# Patient Record
Sex: Female | Born: 1955
Health system: Southern US, Community
[De-identification: ages and names within clinical notes are randomized; demographics above are authoritative.]

## PROBLEM LIST (undated history)

## (undated) DIAGNOSIS — M1990 Unspecified osteoarthritis, unspecified site: Secondary | ICD-10-CM

## (undated) DIAGNOSIS — Z933 Colostomy status: Secondary | ICD-10-CM

## (undated) DIAGNOSIS — E119 Type 2 diabetes mellitus without complications: Secondary | ICD-10-CM

## (undated) DIAGNOSIS — K649 Unspecified hemorrhoids: Secondary | ICD-10-CM

## (undated) DIAGNOSIS — J189 Pneumonia, unspecified organism: Secondary | ICD-10-CM

## (undated) DIAGNOSIS — Z5189 Encounter for other specified aftercare: Secondary | ICD-10-CM

## (undated) DIAGNOSIS — F419 Anxiety disorder, unspecified: Secondary | ICD-10-CM

## (undated) DIAGNOSIS — C801 Malignant (primary) neoplasm, unspecified: Secondary | ICD-10-CM

## (undated) DIAGNOSIS — I10 Essential (primary) hypertension: Secondary | ICD-10-CM

## (undated) DIAGNOSIS — C20 Malignant neoplasm of rectum: Secondary | ICD-10-CM

## (undated) DIAGNOSIS — D649 Anemia, unspecified: Secondary | ICD-10-CM

## (undated) DIAGNOSIS — G939 Disorder of brain, unspecified: Secondary | ICD-10-CM

## (undated) DIAGNOSIS — R569 Unspecified convulsions: Secondary | ICD-10-CM

## (undated) DIAGNOSIS — M24662 Ankylosis, left knee: Secondary | ICD-10-CM

## (undated) DIAGNOSIS — Z95828 Presence of other vascular implants and grafts: Secondary | ICD-10-CM

## (undated) DIAGNOSIS — S72402A Unspecified fracture of lower end of left femur, initial encounter for closed fracture: Secondary | ICD-10-CM

## (undated) DIAGNOSIS — N189 Chronic kidney disease, unspecified: Secondary | ICD-10-CM

## (undated) DIAGNOSIS — E7800 Pure hypercholesterolemia, unspecified: Secondary | ICD-10-CM

## (undated) DIAGNOSIS — K56609 Unspecified intestinal obstruction, unspecified as to partial versus complete obstruction: Secondary | ICD-10-CM

## (undated) HISTORY — PX: PORT A CATH INJECTION (ARMC HX): HXRAD1731

## (undated) HISTORY — PX: LACERATION REPAIR: SHX5168

## (undated) HISTORY — PX: ILEOSTOMY CLOSURE: SHX1784

## (undated) HISTORY — PX: COLON SURGERY: SHX602

## (undated) HISTORY — PX: ABDOMINAL HYSTERECTOMY: SHX81

## (undated) HISTORY — PX: PICC LINE PLACE PERIPHERAL (ARMC HX): HXRAD1248

---

## 2013-08-09 DIAGNOSIS — K56609 Unspecified intestinal obstruction, unspecified as to partial versus complete obstruction: Secondary | ICD-10-CM

## 2013-08-09 HISTORY — PX: ILEOSTOMY: SHX1783

## 2013-08-09 HISTORY — DX: Unspecified intestinal obstruction, unspecified as to partial versus complete obstruction: K56.609

## 2013-10-09 ENCOUNTER — Encounter (HOSPITAL_BASED_OUTPATIENT_CLINIC_OR_DEPARTMENT_OTHER): Payer: Self-pay | Admitting: Emergency Medicine

## 2013-10-09 ENCOUNTER — Emergency Department (HOSPITAL_BASED_OUTPATIENT_CLINIC_OR_DEPARTMENT_OTHER)
Admission: EM | Admit: 2013-10-09 | Discharge: 2013-10-09 | Disposition: A | Discharge status: Home / Self Care | Payer: BC Managed Care – PPO | Attending: Emergency Medicine | Admitting: Emergency Medicine

## 2013-10-09 DIAGNOSIS — Z87891 Personal history of nicotine dependence: Secondary | ICD-10-CM | POA: Insufficient documentation

## 2013-10-09 DIAGNOSIS — R000 Tachycardia, unspecified: Secondary | ICD-10-CM | POA: Insufficient documentation

## 2013-10-09 DIAGNOSIS — Z79899 Other long term (current) drug therapy: Secondary | ICD-10-CM | POA: Insufficient documentation

## 2013-10-09 DIAGNOSIS — R198 Other specified symptoms and signs involving the digestive system and abdomen: Secondary | ICD-10-CM | POA: Insufficient documentation

## 2013-10-09 DIAGNOSIS — I10 Essential (primary) hypertension: Secondary | ICD-10-CM | POA: Insufficient documentation

## 2013-10-09 DIAGNOSIS — E7800 Pure hypercholesterolemia, unspecified: Secondary | ICD-10-CM | POA: Insufficient documentation

## 2013-10-09 DIAGNOSIS — E119 Type 2 diabetes mellitus without complications: Secondary | ICD-10-CM | POA: Insufficient documentation

## 2013-10-09 DIAGNOSIS — K625 Hemorrhage of anus and rectum: Secondary | ICD-10-CM | POA: Insufficient documentation

## 2013-10-09 DIAGNOSIS — K6289 Other specified diseases of anus and rectum: Secondary | ICD-10-CM

## 2013-10-09 DIAGNOSIS — R739 Hyperglycemia, unspecified: Secondary | ICD-10-CM

## 2013-10-09 HISTORY — DX: Type 2 diabetes mellitus without complications: E11.9

## 2013-10-09 HISTORY — DX: Unspecified hemorrhoids: K64.9

## 2013-10-09 HISTORY — DX: Essential (primary) hypertension: I10

## 2013-10-09 HISTORY — DX: Pure hypercholesterolemia, unspecified: E78.00

## 2013-10-09 LAB — COMPREHENSIVE METABOLIC PANEL
ALBUMIN: 4.2 g/dL (ref 3.5–5.2)
ALT: 10 U/L (ref 0–35)
AST: 12 U/L (ref 0–37)
Alkaline Phosphatase: 72 U/L (ref 39–117)
BUN: 13 mg/dL (ref 6–23)
CALCIUM: 10.5 mg/dL (ref 8.4–10.5)
CO2: 26 mEq/L (ref 19–32)
Chloride: 96 mEq/L (ref 96–112)
Creatinine, Ser: 0.7 mg/dL (ref 0.50–1.10)
GFR calc Af Amer: >90 mL/min (ref >90)
GFR calc non Af Amer: >90 mL/min (ref >90)
GLUCOSE: 419 mg/dL — AB (ref 70–99)
Potassium: 3.7 mEq/L (ref 3.7–5.3)
SODIUM: 136 meq/L — AB (ref 137–147)
TOTAL PROTEIN: 8 g/dL (ref 6.0–8.3)
Total Bilirubin: 0.4 mg/dL (ref 0.3–1.2)

## 2013-10-09 LAB — CBC WITH DIFFERENTIAL/PLATELET
BASOS ABS: 0 10*3/uL (ref 0.0–0.1)
Basophils Relative: 0 % (ref 0–1)
EOS ABS: 0.2 10*3/uL (ref 0.0–0.7)
EOS PCT: 2 % (ref 0–5)
HCT: 39 % (ref 36.0–46.0)
Hemoglobin: 12.8 g/dL (ref 12.0–15.0)
Lymphocytes Relative: 33 % (ref 12–46)
Lymphs Abs: 3 10*3/uL (ref 0.7–4.0)
MCH: 25.8 pg — AB (ref 26.0–34.0)
MCHC: 32.8 g/dL (ref 30.0–36.0)
MCV: 78.5 fL (ref 78.0–100.0)
Monocytes Absolute: 0.6 10*3/uL (ref 0.1–1.0)
Monocytes Relative: 7 % (ref 3–12)
Neutro Abs: 5.2 10*3/uL (ref 1.7–7.7)
Neutrophils Relative %: 58 % (ref 43–77)
PLATELETS: 273 10*3/uL (ref 150–400)
RBC: 4.97 MIL/uL (ref 3.87–5.11)
RDW: 13.2 % (ref 11.5–15.5)
WBC: 9 10*3/uL (ref 4.0–10.5)

## 2013-10-09 LAB — CBG MONITORING, ED: Glucose-Capillary: 310 mg/dL — ABNORMAL HIGH (ref 70–99)

## 2013-10-09 MED ORDER — SODIUM CHLORIDE 0.9 % IV SOLN
Freq: Once | INTRAVENOUS | Status: AC
  Administered 2013-10-09: 19:00:00 via INTRAVENOUS

## 2013-10-09 MED ORDER — SODIUM CHLORIDE 0.9 % IV SOLN
Freq: Once | INTRAVENOUS | Status: AC
  Administered 2013-10-09: 20:00:00 via INTRAVENOUS

## 2013-10-09 MED ORDER — GELATIN ABSORBABLE 12-7 MM EX MISC
CUTANEOUS | Status: AC
  Administered 2013-10-09: 1
  Filled 2013-10-09: qty 1

## 2013-10-09 NOTE — Discharge Instructions (Signed)
Hyperglycemia °Hyperglycemia occurs when the glucose (sugar) in your blood is too high. Hyperglycemia can happen for many reasons, but it most often happens to people who do not know they have diabetes or are not managing their diabetes properly.  °CAUSES  °Whether you have diabetes or not, there are other causes of hyperglycemia. Hyperglycemia can occur when you have diabetes, but it can also occur in other situations that you might not be as aware of, such as: °Diabetes °· If you have diabetes and are having problems controlling your blood glucose, hyperglycemia could occur because of some of the following reasons: °· Not following your meal plan. °· Not taking your diabetes medications or not taking it properly. °· Exercising less or doing less activity than you normally do. °· Being sick. °Pre-diabetes °· This cannot be ignored. Before people develop Type 2 diabetes, they almost always have "pre-diabetes." This is when your blood glucose levels are higher than normal, but not yet high enough to be diagnosed as diabetes. Research has shown that some long-term damage to the body, especially the heart and circulatory system, may already be occurring during pre-diabetes. If you take action to manage your blood glucose when you have pre-diabetes, you may delay or prevent Type 2 diabetes from developing. °Stress °· If you have diabetes, you may be "diet" controlled or on oral medications or insulin to control your diabetes. However, you may find that your blood glucose is higher than usual in the hospital whether you have diabetes or not. This is often referred to as "stress hyperglycemia." Stress can elevate your blood glucose. This happens because of hormones put out by the body during times of stress. If stress has been the cause of your high blood glucose, it can be followed regularly by your caregiver. That way he/she can make sure your hyperglycemia does not continue to get worse or progress to  diabetes. °Steroids °· Steroids are medications that act on the infection fighting system (immune system) to block inflammation or infection. One side effect can be a rise in blood glucose. Most people can produce enough extra insulin to allow for this rise, but for those who cannot, steroids make blood glucose levels go even higher. It is not unusual for steroid treatments to "uncover" diabetes that is developing. It is not always possible to determine if the hyperglycemia will go away after the steroids are stopped. A special blood test called an A1c is sometimes done to determine if your blood glucose was elevated before the steroids were started. °SYMPTOMS °· Thirsty. °· Frequent urination. °· Dry mouth. °· Blurred vision. °· Tired or fatigue. °· Weakness. °· Sleepy. °· Tingling in feet or leg. °DIAGNOSIS  °Diagnosis is made by monitoring blood glucose in one or all of the following ways: °· A1c test. This is a chemical found in your blood. °· Fingerstick blood glucose monitoring. °· Laboratory results. °TREATMENT  °First, knowing the cause of the hyperglycemia is important before the hyperglycemia can be treated. Treatment may include, but is not be limited to: °· Education. °· Change or adjustment in medications. °· Change or adjustment in meal plan. °· Treatment for an illness, infection, etc. °· More frequent blood glucose monitoring. °· Change in exercise plan. °· Decreasing or stopping steroids. °· Lifestyle changes. °HOME CARE INSTRUCTIONS  °· Test your blood glucose as directed. °· Exercise regularly. Your caregiver will give you instructions about exercise. Pre-diabetes or diabetes which comes on with stress is helped by exercising. °· Eat wholesome,   balanced meals. Eat often and at regular, fixed times. Your caregiver or nutritionist will give you a meal plan to guide your sugar intake.  Being at an ideal weight is important. If needed, losing as little as 10 to 15 pounds may help improve blood  glucose levels. SEEK MEDICAL CARE IF:   You have questions about medicine, activity, or diet.  You continue to have symptoms (problems such as increased thirst, urination, or weight gain). SEEK IMMEDIATE MEDICAL CARE IF:   You are vomiting or have diarrhea.  Your breath smells fruity.  You are breathing faster or slower.  You are very sleepy or incoherent.  You have numbness, tingling, or pain in your feet or hands.  You have chest pain.  Your symptoms get worse even though you have been following your caregiver's orders.  If you have any other questions or concerns. Document Released: 01/19/2001 Document Revised: 10/18/2011 Document Reviewed: 11/22/2011 [facility] Patient Information 2014 [place], [place]. Rectal Bleeding Rectal bleeding is when blood passes out of the anus. It is usually a sign that something is wrong. It may not be serious, but it should always be evaluated. Rectal bleeding may present as bright red blood or extremely dark stools. The color may range from dark red or maroon to black (like tar). It is important that the cause of rectal bleeding be identified so treatment can be started and the problem corrected. CAUSES   Hemorrhoids. These are enlarged (dilated) blood vessels or veins in the anal or rectal area.  Fistulas. Theseare abnormal, burrowing channels that usually run from inside the rectum to the skin around the anus. They can bleed.  Anal fissures. This is a tear in the tissue of the anus. Bleeding occurs with bowel movements.  Diverticulosis. This is a condition in which pockets or sacs project from the bowel wall. Occasionally, the sacs can bleed.  Diverticulitis. Thisis an infection involving diverticulosis of the colon.  Proctitis and colitis. These are conditions in which the rectum, colon, or both, can become inflamed and pitted (ulcerated).  Polyps and cancer. Polyps are non-cancerous (benign) growths in the colon that may bleed. Certain  types of polyps turn into cancer.  Protrusion of the rectum. Part of the rectum can project from the anus and bleed.  Certain medicines.  Intestinal infections.  Blood vessel abnormalities. HOME CARE INSTRUCTIONS  Eat a high-fiber diet to keep your stool soft.  Limit activity.  Drink enough fluids to keep your urine clear or pale yellow.  Warm baths may be useful to soothe rectal pain.  Follow up with your caregiver as directed. SEEK IMMEDIATE MEDICAL CARE IF:  You develop increased bleeding.  You have black or dark red stools.  You vomit blood or material that looks like coffee grounds.  You have abdominal pain or tenderness.  You have a fever.  You feel weak, nauseous, or you faint.  You have severe rectal pain or you are unable to have a bowel movement. MAKE SURE YOU:  Understand these instructions.  Will watch your condition.  Will get help right away if you are not doing well or get worse. Document Released: 01/15/2002 Document Revised: 10/18/2011 Document Reviewed: 01/10/2011 [facility] Patient Information 2014 [place], [place].

## 2013-10-09 NOTE — ED Provider Notes (Signed)
Medical screening examination/treatment/procedure(s) were conducted as a shared visit with non-physician practitioner(s) and myself.  I personally evaluated the patient during the encounter.   EKG Interpretation   Date/Time:  Tuesday October 09 2013 18:28:53 EST Ventricular Rate:  160 PR Interval:  130 QRS Duration: 62 QT Interval:  244 QTC Calculation: 398 R Axis:   52 Text Interpretation:  Sinus tachycardia Septal infarct , age undetermined  Abnormal ECG Confirmed by Lesha Jager  MD, Iracema Lanagan ([id number]) on 10/09/2013 11:25:28  PM      Pt with 3cm mass protruding from rectum.  Presented with heavy bleeding from mass, no blood from rectum itself.  Bleeding stopped with gelfoam application.  hgb okay.  Will see surgeon tomorrow.  Malvin Johns, MD 10/09/13 [phone]

## 2013-10-09 NOTE — ED Provider Notes (Signed)
CSN: [id number]     Arrival date & time 10/09/13  1803 History   First MD Initiated Contact with Patient 10/09/13 1827     Chief Complaint  Patient presents with  . Hemorrhoids     (Consider location/radiation/quality/duration/timing/severity/associated sxs/prior Treatment) Patient is a 58 y.o. female presenting with hematochezia. The history is provided by the patient. No language interpreter was used.  Rectal Bleeding Quality:  Bright red Amount:  Moderate Duration:  1 day Timing:  Constant Progression:  Worsening Chronicity:  Chronic Context: hemorrhoids   Similar prior episodes: no   Relieved by:  Nothing Worsened by:  Nothing tried Ineffective treatments:  Hemorrhoid cream Associated symptoms: no abdominal pain   Risk factors: no hx of colorectal surgery     Past Medical History  Diagnosis Date  . Hypertension   . Diabetes mellitus without complication   . Elevated cholesterol   . Hemorrhoids    Past Surgical History  Procedure Laterality Date  . Abdominal hysterectomy     No family history on file. History  Substance Use Topics  . Smoking status: Former Research scientist (life sciences)  . Smokeless tobacco: Never Used  . Alcohol Use: No   OB History   Grav Para Term Preterm Abortions TAB SAB Ect Mult Living                 Review of Systems  Gastrointestinal: Positive for hematochezia and anal bleeding. Negative for abdominal pain.  All other systems reviewed and are negative.      Allergies  Review of patient's allergies indicates no known allergies.  Home Medications   Current Outpatient Rx  Name  Route  Sig  Dispense  Refill  . Fish Oil OIL   Does not apply   by Does not apply route.         Marland Kitchen GLIPIZIDE PO   Oral   Take by mouth.         . METFORMIN HCL PO   Oral   Take by mouth.         . Pravastatin Sodium (PRAVACHOL PO)   Oral   Take by mouth.         Marland Kitchen UNKNOWN TO PATIENT      Two Blood Pressure pills, does not know the names           BP 161/89  Temp(Src) 98.9 F (37.2 C) (Oral)  Resp 20  Ht 5\' 6"  (1.676 m)  Wt 150 lb (68.04 kg)  BMI 24.22 kg/m2  SpO2 100% Physical Exam  Nursing note and vitals reviewed. Constitutional: She is oriented to person, place, and time. She appears well-developed and well-nourished.  HENT:  Head: Normocephalic.  Right Ear: External ear normal.  Nose: Nose normal.  Eyes: EOM are normal. Pupils are equal, round, and reactive to light.  Neck: Normal range of motion.  Cardiovascular: Normal rate and regular rhythm.   tachycardia  Pulmonary/Chest: Effort normal.  Abdominal: She exhibits no distension.  Genitourinary:  Bright red blood,  Rectum erythematous, ulcerated, 3cm mass,   Does not look hemorrhoidal.   Musculoskeletal: Normal range of motion.  Neurological: She is alert and oriented to person, place, and time.  Psychiatric: She has a normal mood and affect.    ED Course  Procedures (including critical care time) Labs Review Labs Reviewed  CBC WITH DIFFERENTIAL - Abnormal; Notable for the following:    MCH 25.8 (*)    All other components within normal limits  COMPREHENSIVE METABOLIC PANEL  TYPE AND SCREEN   Imaging Review No results found.   EKG Interpretation None      MDM Dr. Tamera Punt in to see,   Gelfoam to area  Bleeding decreased    Final diagnoses:  Rectal mass  Hyperglycemia   Consult to General Surgery.   Dr. Hassell Done advised to have pt call office at 9:05 am to be seen in urgent surgery clinic tomorrow.  He advised they will biopsy and set up on going care.   Pt counseled on rectal mass and need for evaluation.  She understands mass could be a cancer and need definitive diagnosis.   Pt's glucose decreased with fluids.   Pt has not taken evening medication.   Pt advised to take her medication and see her Md for recheck of diabetes.        [place], PA-C 10/09/13 2154

## 2013-10-09 NOTE — ED Notes (Signed)
Patient states she has a chronic hemorrhoid problem.  States today at approximately 1630 she developed from the hemorrhoid today.  States she had used Preparation H several times over the last four days due to itching.

## 2013-10-10 ENCOUNTER — Ambulatory Visit (INDEPENDENT_AMBULATORY_CARE_PROVIDER_SITE_OTHER): Payer: BC Managed Care – PPO | Admitting: General Surgery

## 2013-10-10 ENCOUNTER — Encounter (INDEPENDENT_AMBULATORY_CARE_PROVIDER_SITE_OTHER): Payer: Self-pay | Admitting: General Surgery

## 2013-10-10 ENCOUNTER — Encounter (INDEPENDENT_AMBULATORY_CARE_PROVIDER_SITE_OTHER): Payer: Self-pay

## 2013-10-10 ENCOUNTER — Other Ambulatory Visit (INDEPENDENT_AMBULATORY_CARE_PROVIDER_SITE_OTHER): Payer: Self-pay | Admitting: General Surgery

## 2013-10-10 DIAGNOSIS — C210 Malignant neoplasm of anus, unspecified: Secondary | ICD-10-CM

## 2013-10-10 DIAGNOSIS — R198 Other specified symptoms and signs involving the digestive system and abdomen: Secondary | ICD-10-CM

## 2013-10-10 DIAGNOSIS — K6289 Other specified diseases of anus and rectum: Secondary | ICD-10-CM

## 2013-10-10 MED ORDER — LIDOCAINE 5 % EX OINT: 1.0000 "application " | TOPICAL_OINTMENT | CUTANEOUS | Status: DC | PRN

## 2013-10-10 NOTE — Progress Notes (Signed)
Chief Complaint  Patient presents with  . Rectal Bleeding    HISTORY: Barbara Matthews is a 58 y.o. female who presents to the office with rectal bleeding.    She has had occasional bleeding since July.  She had an severe episode of bleeding last night.  She presented to [facility] with severe rectal bleeding. It appears this was stopped with Gelfoam placement.  She has tried tucks and prep H at home with good success.  Bowel movement makes the symptoms worse.   It is intermittent in nature.  her bowel habits are irregular and her bowel movements are every 3-4 days.  Her fiber intake is min.  SHe has never had a colonoscopy.     Past Medical History  Diagnosis Date  . Hypertension   . Diabetes mellitus without complication   . Elevated cholesterol   . Hemorrhoids       Past Surgical History  Procedure Laterality Date  . Abdominal hysterectomy          Current Outpatient Prescriptions  Medication Sig Dispense Refill  . amLODipine (NORVASC) 10 MG tablet Take 10 mg by mouth daily.      . Fish Oil OIL by Does not apply route.      Marland Kitchen GLIPIZIDE PO Take by mouth.      . losartan (COZAAR) 100 MG tablet Take 100 mg by mouth daily.      Marland Kitchen METFORMIN HCL PO Take by mouth.      . Pravastatin Sodium (PRAVACHOL PO) Take by mouth.      Marland Kitchen UNKNOWN TO PATIENT Two Blood Pressure pills, does not know the names      . Vitamin D, Ergocalciferol, (DRISDOL) 50000 UNITS CAPS capsule       . lidocaine (XYLOCAINE) 5 % ointment Apply 1 application topically as needed.  35.44 g  0   No current facility-administered medications for this visit.      No Known Allergies    History reviewed. No pertinent family history.  History   Social History  . Marital Status: Single    Spouse Name: N/A    Number of Children: N/A  . Years of Education: N/A   Social History Main Topics  . Smoking status: Former Research scientist (life sciences)  . Smokeless tobacco: Never Used  . Alcohol Use: No  . Drug Use: No  . Sexual Activity:  None   Other Topics Concern  . None   Social History Narrative  . None      REVIEW OF SYSTEMS - PERTINENT POSITIVES ONLY: Review of Systems - General ROS: negative for - chills, fever or weight loss Hematological and Lymphatic ROS: negative for - bleeding problems, blood clots or bruising Respiratory ROS: no cough, shortness of breath, or wheezing Cardiovascular ROS: no chest pain or dyspnea on exertion Gastrointestinal ROS: no abdominal pain, change in bowel habits Genito-Urinary ROS: no dysuria, trouble voiding, or hematuria  EXAM: There were no vitals filed for this visit.  General appearance: alert and cooperative Resp: clear to auscultation bilaterally Cardio: regular rate and rhythm GI: normal findings: soft, non-tender Anal exam: Large firm perianal mass in the posterior region extending 2-3 cm proximally.  Concerning for anal carcinoma  Procedure: Biopsy of anal mass Surgeon: Marcello Moores Diagnosis: anal mass  Assistant: Morris After the risks and benefits were explained, verbal consent was obtained for above procedure  Anesthesia: local  Description: Her perianal area was cleaned with ChloraPrep. Subcutaneous lidocaine with epinephrine was injected For local anesthesia.  A biopsy was taken with scissors. Hemostasis was achieved with direct pressure and silver nitrate. The patient tolerated the procedure well.    ASSESSMENT AND PLAN: Barbara Matthews Is a 58 year old female who has recently noted an increase in rectal bleeding. On exam she has an anal mass that is concerning for anal carcinoma. A biopsy was taken and sent to pathology. She has never had a colonoscopy and she is over the age of 58. I have recommended that she undergo a screening colonoscopy, no matter the results of the biopsy. We will get this scheduled ASAP.  I will call her with the results of her biopsy as soon as I get them. We did discuss that this is most likely an anal cancer. We briefly discussed the  treatment for anal cancer and prognosis.    Rosario Adie, MD Colon and Rectal Surgery / [facility]      Visit Diagnoses: 1. Mass of anus     Primary Care Physician: No primary provider on file.

## 2013-10-10 NOTE — Patient Instructions (Addendum)
We will call you with the results of your biopsy in a couple days.  Call the office if you have bleeding that won't stop with direct pressure (rolled up washcloth placed between butt cheeks and sit on a hard surface).    [facility] [place] [facility]  ONE-DAY (1) PRE-OP HOME COLON PREP INSTRUCTIONS: ** MIRALAX / GATORADE PREP **  You must follow the instructions below carefully.  If you have questions or problems, please call and speak to someone in the clinic department at our office:   [phone].     INSTRUCTIONS: 1. Five days prior to your procedure do not eat nuts, popcorn, or fruit with seeds.  Stop all fiber supplements such as Metamucil, Citrucel, etc. 2. Two days before surgery fill the prescription at a pharmacy of your choice and purchase the additional supplies below.         MIRALAX - GATORADE -- DULCOLAX TABS:   Purchase a bottle of MIRALAX  (238 gm bottle)    In addition, purchase four (4) DULCOLAX TABLETS (no prescription required- ask the pharmacist if you can't find them)    Purchase one 64 oz GATORADE.  (Do NOT purchase red Gatorade; any other flavor is acceptable) and place in refrigerator to get cold.  3.   Day Before Surgery:   6 am: take the 4 Dulcolax tablets   You may only have clear liquids (tea, coffee, juice, broth, jello, soft drinks, gummy bears).  You cannot have solid foods, cream, milk or milk products.  Drink at lease 8 ounces of liquids every hour while awake.   Mix the entire bottle of MiraLax and the Gatorade in a large container.    10:00am: Begin drinking the Gatorade mixture until gone (8 oz every 15-30 minutes).      You may suck on a lime wedge or hard candy to "freshen your palate" in between glasses   If you are a diabetic, take your blood sugar reading several time throughout the prep.  Have some juice available to take if your sugar level gets too low   You may feel chilled while taking the prep.  Have some warm tea or broth to help warm up.   Continue  clear liquids until midnight or bedtime  3. The day of your procedure:   Do not eat or drink ANYTHING after midnight before your surgery.     If you take Heart or Blood Pressure medicine, ask the pre-op nurses about these during your preop appointment.   Further pre-operative instructions will be given to you from the hospital.   Expect to be contacted 5-7 days before your surgery.

## 2013-10-15 ENCOUNTER — Other Ambulatory Visit (INDEPENDENT_AMBULATORY_CARE_PROVIDER_SITE_OTHER): Payer: Self-pay

## 2013-10-15 ENCOUNTER — Telehealth (INDEPENDENT_AMBULATORY_CARE_PROVIDER_SITE_OTHER): Payer: Self-pay | Admitting: General Surgery

## 2013-10-15 DIAGNOSIS — K6289 Other specified diseases of anus and rectum: Secondary | ICD-10-CM

## 2013-10-15 NOTE — Telephone Encounter (Signed)
Left message to call to discuss biopsy results

## 2013-10-17 ENCOUNTER — Telehealth (INDEPENDENT_AMBULATORY_CARE_PROVIDER_SITE_OTHER): Payer: Self-pay | Admitting: *Deleted

## 2013-10-17 NOTE — Telephone Encounter (Signed)
I spoke with pt and informed her of the appt for her to see Dr. Moses Manners at [facility] in [facility] on 10/18/13 with an arrival time of 10:15am.  She states she will be unable to go tomorrow so I provided her with their phone number [phone]) to call and reschedule appt.  Patient also states she has some additional questions regarding her colonoscopy scheduled for Friday 10/19/13 and would like to talk with Dr. Marcello Moores.  She is also questioning a prescription that she is supposed to get filled today but I do not see anything under her medication list to advise her upon.  I informed her that I will route this message to Dr. Marcello Moores to give her a call.  She states that she will be available at [phone] after 1:30pm today.  She is agreeable with all instructions given.

## 2013-10-19 ENCOUNTER — Ambulatory Visit (HOSPITAL_COMMUNITY)
Admission: RE | Admit: 2013-10-19 | Discharge: 2013-10-19 | Disposition: A | Discharge status: Home / Self Care | Payer: BC Managed Care – PPO | Source: Ambulatory Visit | Attending: General Surgery | Admitting: General Surgery

## 2013-10-19 ENCOUNTER — Encounter (HOSPITAL_COMMUNITY)
Admission: RE | Disposition: A | Discharge status: Home / Self Care | Payer: Self-pay | Source: Ambulatory Visit | Attending: General Surgery

## 2013-10-19 ENCOUNTER — Encounter (HOSPITAL_COMMUNITY): Payer: Self-pay | Admitting: *Deleted

## 2013-10-19 DIAGNOSIS — Z87891 Personal history of nicotine dependence: Secondary | ICD-10-CM | POA: Insufficient documentation

## 2013-10-19 DIAGNOSIS — D126 Benign neoplasm of colon, unspecified: Secondary | ICD-10-CM

## 2013-10-19 DIAGNOSIS — I10 Essential (primary) hypertension: Secondary | ICD-10-CM | POA: Insufficient documentation

## 2013-10-19 DIAGNOSIS — E119 Type 2 diabetes mellitus without complications: Secondary | ICD-10-CM | POA: Insufficient documentation

## 2013-10-19 DIAGNOSIS — Z90710 Acquired absence of both cervix and uterus: Secondary | ICD-10-CM | POA: Insufficient documentation

## 2013-10-19 DIAGNOSIS — E7800 Pure hypercholesterolemia, unspecified: Secondary | ICD-10-CM | POA: Insufficient documentation

## 2013-10-19 DIAGNOSIS — K625 Hemorrhage of anus and rectum: Secondary | ICD-10-CM | POA: Insufficient documentation

## 2013-10-19 DIAGNOSIS — Z79899 Other long term (current) drug therapy: Secondary | ICD-10-CM | POA: Insufficient documentation

## 2013-10-19 DIAGNOSIS — Z1211 Encounter for screening for malignant neoplasm of colon: Secondary | ICD-10-CM

## 2013-10-19 HISTORY — DX: Anxiety disorder, unspecified: F41.9

## 2013-10-19 HISTORY — PX: COLONOSCOPY: SHX5424

## 2013-10-19 LAB — CBC
HEMATOCRIT: 39 % (ref 36.0–46.0)
Hemoglobin: 12.6 g/dL (ref 12.0–15.0)
MCH: 25.6 pg — ABNORMAL LOW (ref 26.0–34.0)
MCHC: 32.3 g/dL (ref 30.0–36.0)
MCV: 79.1 fL (ref 78.0–100.0)
Platelets: 310 10*3/uL (ref 150–400)
RBC: 4.93 MIL/uL (ref 3.87–5.11)
RDW: 13.6 % (ref 11.5–15.5)
WBC: 7.2 10*3/uL (ref 4.0–10.5)

## 2013-10-19 LAB — GLUCOSE, CAPILLARY: Glucose-Capillary: 227 mg/dL — ABNORMAL HIGH (ref 70–99)

## 2013-10-19 SURGERY — COLONOSCOPY
Anesthesia: Moderate Sedation

## 2013-10-19 MED ORDER — MIDAZOLAM HCL 10 MG/2ML IJ SOLN
INTRAMUSCULAR | Status: AC
  Filled 2013-10-19: qty 2

## 2013-10-19 MED ORDER — LIDOCAINE 5 % EX OINT
TOPICAL_OINTMENT | CUTANEOUS | Status: DC
  Filled 2013-10-19: qty 35.44

## 2013-10-19 MED ORDER — FENTANYL CITRATE 0.05 MG/ML IJ SOLN
INTRAMUSCULAR | Status: AC
  Filled 2013-10-19: qty 2

## 2013-10-19 MED ORDER — MIDAZOLAM HCL 10 MG/2ML IJ SOLN
INTRAMUSCULAR | Status: DC | PRN
  Administered 2013-10-19: 2 mg via INTRAVENOUS
  Administered 2013-10-19: 1 mg via INTRAVENOUS
  Administered 2013-10-19 (×2): 2 mg via INTRAVENOUS

## 2013-10-19 MED ORDER — DIPHENHYDRAMINE HCL 50 MG/ML IJ SOLN
INTRAMUSCULAR | Status: AC
  Filled 2013-10-19: qty 1

## 2013-10-19 MED ORDER — FENTANYL CITRATE 0.05 MG/ML IJ SOLN
INTRAMUSCULAR | Status: DC | PRN
  Administered 2013-10-19 (×2): 25 ug via INTRAVENOUS

## 2013-10-19 MED ORDER — METOPROLOL TARTRATE 1 MG/ML IV SOLN
INTRAVENOUS | Status: AC
  Filled 2013-10-19: qty 5

## 2013-10-19 MED ORDER — METOPROLOL TARTRATE 1 MG/ML IV SOLN
5.0000 mg | Freq: Once | INTRAVENOUS | Status: AC
  Administered 2013-10-19: 5 mg via INTRAVENOUS

## 2013-10-19 MED ORDER — SODIUM CHLORIDE 0.9 % IV SOLN
INTRAVENOUS | Status: DC
  Administered 2013-10-19: 500 mL via INTRAVENOUS

## 2013-10-19 MED ORDER — LIDOCAINE VISCOUS 2 % MT SOLN
OROMUCOSAL | Status: AC
  Filled 2013-10-19: qty 15

## 2013-10-19 MED ORDER — DIPHENHYDRAMINE HCL 50 MG/ML IJ SOLN
INTRAMUSCULAR | Status: DC | PRN
  Administered 2013-10-19: 25 mg via INTRAVENOUS

## 2013-10-19 NOTE — H&P (View-Only) (Signed)
Chief Complaint  Patient presents with  . Rectal Bleeding    HISTORY: Barbara Matthews is a 58 y.o. female who presents to the office with rectal bleeding.    She has had occasional bleeding since July.  She had an severe episode of bleeding last night.  She presented to [facility] with severe rectal bleeding. It appears this was stopped with Gelfoam placement.  She has tried tucks and prep H at home with good success.  Bowel movement makes the symptoms worse.   It is intermittent in nature.  her bowel habits are irregular and her bowel movements are every 3-4 days.  Her fiber intake is min.  SHe has never had a colonoscopy.     Past Medical History  Diagnosis Date  . Hypertension   . Diabetes mellitus without complication   . Elevated cholesterol   . Hemorrhoids       Past Surgical History  Procedure Laterality Date  . Abdominal hysterectomy          Current Outpatient Prescriptions  Medication Sig Dispense Refill  . amLODipine (NORVASC) 10 MG tablet Take 10 mg by mouth daily.      . Fish Oil OIL by Does not apply route.      Marland Kitchen GLIPIZIDE PO Take by mouth.      . losartan (COZAAR) 100 MG tablet Take 100 mg by mouth daily.      Marland Kitchen METFORMIN HCL PO Take by mouth.      . Pravastatin Sodium (PRAVACHOL PO) Take by mouth.      Marland Kitchen UNKNOWN TO PATIENT Two Blood Pressure pills, does not know the names      . Vitamin D, Ergocalciferol, (DRISDOL) 50000 UNITS CAPS capsule       . lidocaine (XYLOCAINE) 5 % ointment Apply 1 application topically as needed.  35.44 g  0   No current facility-administered medications for this visit.      No Known Allergies    History reviewed. No pertinent family history.  History   Social History  . Marital Status: Single    Spouse Name: N/A    Number of Children: N/A  . Years of Education: N/A   Social History Main Topics  . Smoking status: Former Research scientist (life sciences)  . Smokeless tobacco: Never Used  . Alcohol Use: No  . Drug Use: No  . Sexual Activity:  None   Other Topics Concern  . None   Social History Narrative  . None      REVIEW OF SYSTEMS - PERTINENT POSITIVES ONLY: Review of Systems - General ROS: negative for - chills, fever or weight loss Hematological and Lymphatic ROS: negative for - bleeding problems, blood clots or bruising Respiratory ROS: no cough, shortness of breath, or wheezing Cardiovascular ROS: no chest pain or dyspnea on exertion Gastrointestinal ROS: no abdominal pain, change in bowel habits Genito-Urinary ROS: no dysuria, trouble voiding, or hematuria  EXAM: There were no vitals filed for this visit.  General appearance: alert and cooperative Resp: clear to auscultation bilaterally Cardio: regular rate and rhythm GI: normal findings: soft, non-tender Anal exam: Large firm perianal mass in the posterior region extending 2-3 cm proximally.  Concerning for anal carcinoma  Procedure: Biopsy of anal mass Surgeon: Marcello Moores Diagnosis: anal mass  Assistant: Morris After the risks and benefits were explained, verbal consent was obtained for above procedure  Anesthesia: local  Description: Her perianal area was cleaned with ChloraPrep. Subcutaneous lidocaine with epinephrine was injected For local anesthesia.  A biopsy was taken with scissors. Hemostasis was achieved with direct pressure and silver nitrate. The patient tolerated the procedure well.    ASSESSMENT AND PLAN: Barbara Matthews Is a 58 year old female who has recently noted an increase in rectal bleeding. On exam she has an anal mass that is concerning for anal carcinoma. A biopsy was taken and sent to pathology. She has never had a colonoscopy and she is over the age of 45. I have recommended that she undergo a screening colonoscopy, no matter the results of the biopsy. We will get this scheduled ASAP.  I will call her with the results of her biopsy as soon as I get them. We did discuss that this is most likely an anal cancer. We briefly discussed the  treatment for anal cancer and prognosis.    Rosario Adie, MD Colon and Rectal Surgery / [facility]      Visit Diagnoses: 1. Mass of anus     Primary Care Physician: No primary provider on file.

## 2013-10-19 NOTE — Interval H&P Note (Signed)
History and Physical Interval Note:  10/19/2013 2:03 PM  [place]  has presented today for surgery, with the diagnosis of screening  The various methods of treatment have been discussed with the patient and family. After consideration of risks, benefits and other options for treatment, the patient has consented to  Procedure(s): SCREENING COLONOSCOPY (N/A) as a surgical intervention .  The patient's history has been reviewed, patient examined, no change in status, stable for surgery.  I have reviewed the patient's chart and labs.  Questions were answered to the patient's satisfaction.     Rosario Adie, MD  [facility]

## 2013-10-19 NOTE — Op Note (Signed)
[facility] [place], [address]   COLONOSCOPY PROCEDURE REPORT  PATIENT: Barbara Matthews, Barbara Matthews  MR#: [id number] BIRTHDATE: [birth year] , 57  yrs. old GENDER: Female ENDOSCOPIST: Rosario Adie, MD REFERRED BY: PROCEDURE DATE:  10/19/2013 PROCEDURE:   Colorectal cancer screening - average risk patient ASA CLASS:   Class II INDICATIONS:Colorectal cancer screening. MEDICATIONS: Fentanyl 50 mcg IV, Versed 7 mg IV, and Diphenhydramine (Benadryl) 25  DESCRIPTION OF PROCEDURE:   After the risks benefits and alternatives of the procedure were thoroughly explained, informed consent was obtained.  A digital rectal exam revealed a palpable rectal mass.   The   endoscope was introduced through the anus and advanced to the cecum, which was identified by both the appendix and ileocecal valve. No adverse events experienced.   The quality of the prep was good, using MiraLax  The instrument was then slowly withdrawn as the colon was fully examined.      COLON FINDINGS: A small polypoid shaped flat polyp was found in the sigmoid colon.  A polypectomy was performed with cold forceps. Retroflexion showed the known anal mass extending into distal rectum.     The scope was withdrawn and the procedure completed.   TIME TO CECUM: 30 minutes 0 seconds WITHDRAWAL TIME:24 minutes 0 seconds COMPLICATIONS: There were no complications. ENDOSCOPIC IMPRESSION: 1.   Small flat polyp was found in the sigmoid colon; polypectomy was performed with cold forceps 2.   Anorectal mass  RECOMMENDATIONS: Repeat colonoscopy in 5 years if polyp adenomatous; otherwise 10 years  eSigned:  Rosario Adie, MD 26/33/3545 3:22 PM   cc:

## 2013-10-19 NOTE — Discharge Instructions (Signed)
Post Colonoscopy Instructions ° °1. DIET: Follow a light bland diet the first 24 hours after arrival home, such as soup, liquids, crackers, etc.  Be sure to include lots of fluids daily.  Avoid fast food or heavy meals as your are more likely to get nauseated.   °2. You may have some mild rectal bleeding for the first few days after the procedure.  This should get less and less with time.  Resume any blood thinners 2 days after your procedure unless directed otherwise by your physician. °3. Take your usually prescribed home medications unless otherwise directed. °a. If you have any pain, it is helpful to get up and walk around, as it is usually from excess gas. °b. If this is not helpful, you can take an over-the-counter pain medication.  Choose one of the following that works best for you: °i. Naproxen (Aleve, etc)  Two 220mg tabs twice a day °ii. Ibuprofen (Advil, etc) Three 200mg tabs four times a day (every meal & bedtime) °iii. If you still have pain after using one of these, please call the office °4. It is normal to not have a bowel movement for 2-3 days after colonoscopy.   ° °5. ACTIVITIES as tolerated:   °6. You may resume regular (light) daily activities beginning the next day--such as daily self-care, walking, climbing stairs--gradually increasing activities as tolerated.  ° ° °WHEN TO CALL US [phone]: °1. Fever over 101.5 F (38.5 C)  °2. Severe abdominal or chest pain  °3. Large amount of rectal bleeding, passing multiple blood clots  °4. Dizziness or shortness of breath °5. Increasing nausea or vomiting ° ° The clinic staff is available to answer your questions during regular business hours (8:30am-5pm).  Please don’t hesitate to call and ask to speak to one of our nurses for clinical concerns.  ° If you have a medical emergency, go to the nearest emergency room or call 911. ° A surgeon from [facility] is always on call at the hospitals ° ° °[facility] °[address], [address], [place], [place]  [address] ? °MAIN: [phone] ? TOLL FREE: 1-800-359-8415 ?  °FAX [phone] °[email or web address] ° ° °

## 2013-10-22 ENCOUNTER — Encounter (HOSPITAL_COMMUNITY): Payer: Self-pay | Admitting: General Surgery

## 2013-11-12 ENCOUNTER — Telehealth (INDEPENDENT_AMBULATORY_CARE_PROVIDER_SITE_OTHER): Payer: Self-pay | Admitting: General Surgery

## 2013-11-12 NOTE — Telephone Encounter (Signed)
Patient called and stated that someone from Dr Marcello Moores office called on Friday and I told the patient that no one was in the office on Friday and she stated that our number came though on her phone that someone from here called her and I looked, and I did not see a phone message, I told the patient that I will send a message to Dr Marcello Moores or Holley Raring

## 2013-11-13 ENCOUNTER — Telehealth (INDEPENDENT_AMBULATORY_CARE_PROVIDER_SITE_OTHER): Payer: Self-pay

## 2013-11-13 ENCOUNTER — Other Ambulatory Visit (INDEPENDENT_AMBULATORY_CARE_PROVIDER_SITE_OTHER): Payer: Self-pay

## 2013-11-13 DIAGNOSIS — K6289 Other specified diseases of anus and rectum: Secondary | ICD-10-CM

## 2013-11-13 NOTE — Telephone Encounter (Signed)
Scheduled patient for Ct Abdomen/pelvis, And chest with contrast  At [facility] 11-20-13@ 8am , Inquire about GYN appt Per Dr Marcello Moores , Spoke with patient to inform her of CT Appts she stated she was unaware she needed to have a CT, she ask to speak TO Dr. Marcello Moores concerning her Path Results and she also did not reschedule an appointment as advised with [facility] physicians  OB/GYN  # [phone]. Dr. Marcello Moores spoke with patient about her finding and why other images/GYN visits are needed and agreed to go forward. I spoke with Patient she inform me  she cancelled the CT for 11-20-13 when Radiology called today ,she is claustrophobic so she needs an open CT . She is asking for the CT to be done in [facility]. She was advised to call [facility] Physician .

## 2013-11-19 ENCOUNTER — Telehealth (INDEPENDENT_AMBULATORY_CARE_PROVIDER_SITE_OTHER): Payer: Self-pay | Admitting: *Deleted

## 2013-11-19 NOTE — Telephone Encounter (Signed)
Patient given Date/time of CT at Med center in [facility] , she is aware to be NPO after MD tonight and to P/U contrast today for her CT. She states she did schedule the GYN visit for 12/03/13.

## 2013-11-19 NOTE — Telephone Encounter (Signed)
LM for pt to cal the office.  Make sure pt knows that she needs to pick up her contrast for her CT's that are scheduled for tomorrow, 4.14.15 @ [place]!

## 2013-11-20 ENCOUNTER — Encounter (HOSPITAL_BASED_OUTPATIENT_CLINIC_OR_DEPARTMENT_OTHER): Payer: Self-pay

## 2013-11-20 ENCOUNTER — Ambulatory Visit (HOSPITAL_BASED_OUTPATIENT_CLINIC_OR_DEPARTMENT_OTHER)
Admission: RE | Admit: 2013-11-20 | Discharge: 2013-11-20 | Disposition: A | Discharge status: Home / Self Care | Payer: BC Managed Care – PPO | Source: Ambulatory Visit | Attending: General Surgery | Admitting: General Surgery

## 2013-11-20 ENCOUNTER — Ambulatory Visit (HOSPITAL_COMMUNITY): Payer: BC Managed Care – PPO

## 2013-11-20 DIAGNOSIS — K5900 Constipation, unspecified: Secondary | ICD-10-CM | POA: Insufficient documentation

## 2013-11-20 DIAGNOSIS — R198 Other specified symptoms and signs involving the digestive system and abdomen: Secondary | ICD-10-CM | POA: Insufficient documentation

## 2013-11-20 DIAGNOSIS — M479 Spondylosis, unspecified: Secondary | ICD-10-CM | POA: Insufficient documentation

## 2013-11-20 DIAGNOSIS — R911 Solitary pulmonary nodule: Secondary | ICD-10-CM | POA: Insufficient documentation

## 2013-11-20 DIAGNOSIS — K649 Unspecified hemorrhoids: Secondary | ICD-10-CM | POA: Insufficient documentation

## 2013-11-20 DIAGNOSIS — K7689 Other specified diseases of liver: Secondary | ICD-10-CM | POA: Insufficient documentation

## 2013-11-20 HISTORY — DX: Malignant (primary) neoplasm, unspecified: C80.1

## 2013-11-20 MED ORDER — IOHEXOL 300 MG/ML  SOLN
100.0000 mL | Freq: Once | INTRAMUSCULAR | Status: AC | PRN
  Administered 2013-11-20: 100 mL via INTRAVENOUS

## 2013-12-04 ENCOUNTER — Telehealth (INDEPENDENT_AMBULATORY_CARE_PROVIDER_SITE_OTHER): Payer: Self-pay | Admitting: General Surgery

## 2013-12-04 ENCOUNTER — Other Ambulatory Visit (INDEPENDENT_AMBULATORY_CARE_PROVIDER_SITE_OTHER): Payer: Self-pay

## 2013-12-04 DIAGNOSIS — C20 Malignant neoplasm of rectum: Secondary | ICD-10-CM

## 2013-12-04 NOTE — Telephone Encounter (Signed)
Spoke with Dr Bethann Goo a gynecologist in [place] about pt's exam.  He states patient's cancer unlikely to be GYN in origin. CT scan does not show are mentioned ovaries. He has ordered a pelvic ultrasound to complete GYN workup.

## 2013-12-11 ENCOUNTER — Telehealth: Payer: Self-pay | Admitting: Hematology & Oncology

## 2013-12-11 NOTE — Telephone Encounter (Signed)
I spoke w NEW PATIENT today to remind them of their appointment with Dr. Ennever. Also, advised them to bring all meds and insurance information. ° °

## 2013-12-12 ENCOUNTER — Encounter: Payer: Self-pay | Admitting: Hematology & Oncology

## 2013-12-12 ENCOUNTER — Ambulatory Visit: Payer: BC Managed Care – PPO

## 2013-12-12 ENCOUNTER — Other Ambulatory Visit (HOSPITAL_BASED_OUTPATIENT_CLINIC_OR_DEPARTMENT_OTHER): Payer: BC Managed Care – PPO | Admitting: Lab

## 2013-12-12 ENCOUNTER — Ambulatory Visit (HOSPITAL_BASED_OUTPATIENT_CLINIC_OR_DEPARTMENT_OTHER): Payer: BC Managed Care – PPO | Admitting: Hematology & Oncology

## 2013-12-12 VITALS — BP 154/75 | HR 120 | Temp 99.2°F | Resp 12 | Ht 66.0 in | Wt 144.0 lb

## 2013-12-12 DIAGNOSIS — C210 Malignant neoplasm of anus, unspecified: Secondary | ICD-10-CM

## 2013-12-12 DIAGNOSIS — K6289 Other specified diseases of anus and rectum: Secondary | ICD-10-CM

## 2013-12-12 LAB — CBC WITH DIFFERENTIAL (CANCER CENTER ONLY)
BASO#: 0.1 10*3/uL (ref 0.0–0.2)
BASO%: 0.5 % (ref 0.0–2.0)
EOS%: 1.4 % (ref 0.0–7.0)
Eosinophils Absolute: 0.1 10*3/uL (ref 0.0–0.5)
HCT: 35.2 % (ref 34.8–46.6)
HGB: 11.1 g/dL — ABNORMAL LOW (ref 11.6–15.9)
LYMPH#: 2.2 10*3/uL (ref 0.9–3.3)
LYMPH%: 23.5 % (ref 14.0–48.0)
MCH: 25.5 pg — ABNORMAL LOW (ref 26.0–34.0)
MCHC: 31.5 g/dL — ABNORMAL LOW (ref 32.0–36.0)
MCV: 81 fL (ref 81–101)
MONO#: 0.7 10*3/uL (ref 0.1–0.9)
MONO%: 7.3 % (ref 0.0–13.0)
NEUT%: 67.3 % (ref 39.6–80.0)
NEUTROS ABS: 6.3 10*3/uL (ref 1.5–6.5)
PLATELETS: 367 10*3/uL (ref 145–400)
RBC: 4.36 10*6/uL (ref 3.70–5.32)
RDW: 13.6 % (ref 11.1–15.7)
WBC: 9.4 10*3/uL (ref 3.9–10.0)

## 2013-12-12 LAB — COMPREHENSIVE METABOLIC PANEL
ALBUMIN: 4.1 g/dL (ref 3.5–5.2)
ALT: 13 U/L (ref 0–35)
AST: 13 U/L (ref 0–37)
Alkaline Phosphatase: 48 U/L (ref 39–117)
BUN: 14 mg/dL (ref 6–23)
CALCIUM: 9.7 mg/dL (ref 8.4–10.5)
CHLORIDE: 100 meq/L (ref 96–112)
CO2: 25 meq/L (ref 19–32)
Creatinine, Ser: 0.84 mg/dL (ref 0.50–1.10)
Glucose, Bld: 193 mg/dL — ABNORMAL HIGH (ref 70–99)
POTASSIUM: 4.1 meq/L (ref 3.5–5.3)
SODIUM: 136 meq/L (ref 135–145)
Total Bilirubin: 0.3 mg/dL (ref 0.2–1.2)
Total Protein: 7 g/dL (ref 6.0–8.3)

## 2013-12-12 LAB — LACTATE DEHYDROGENASE: LDH: 121 U/L (ref 94–250)

## 2013-12-12 LAB — CA 125: CA 125: 9.9 U/mL (ref 0.0–30.2)

## 2013-12-12 LAB — CEA: CEA: 5.1 ng/mL — ABNORMAL HIGH (ref 0.0–5.0)

## 2013-12-13 NOTE — Progress Notes (Signed)
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 NEW PATIENT  : I'm here because of a cancer  HPI: Barbara Matthews is a very nice 58 year old African American female. She has multiple medical problems. She has a lot of anxiety. She just does not like to go see doctors. She has a lot of anxiety.  She had rectal bleeding. She was seen by Dr. Marcello Moores of [facility] surgery. A biopsy was done of an anal mass. This was found on CT scan. A CT scan was done on May 7. This showed some mild wall thickening of the anal rectal junction. A colonoscopy was done. Dissection was done in March. This showed a mass was noted in the anal area. This was biopsied and found to be an adenocarcinoma. Stains were done. It may have been in anal gland adenocarcinoma. However, a gynecologic primary was raised. 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 Again, on her CT scan nothing looked unusual in the area of the cervix. She had a prior hysterectomy.  She is crying a lot. She is just very upset over having cancer.  She says she has a hemorrhoid. She says it is a there is still some bleeding. She has no pain. No nausea vomiting. No cough. No weight loss or weight gain.  We are asked to see her to see how we get a help in management recommendations.   Past Medical History  Diagnosis Date  . Hypertension   . Diabetes mellitus without complication   . Elevated cholesterol   . Hemorrhoids   . Anxiety   . Cancer   :  Past Surgical History  Procedure Laterality Date  . Abdominal hysterectomy    . Laceration repair    . Colonoscopy N/A 10/19/2013    Procedure: COLONOSCOPY;  Surgeon: Leighton Ruff, MD;  Location: [facility];  Service: Endoscopy;  Laterality: N/A;  :  Current outpatient prescriptions:amLODipine (NORVASC) 10 MG tablet, Take 10 mg by mouth daily., Disp: , Rfl: ;  bisacodyl (DULCOLAX) 5 MG EC tablet, Take 5 mg by mouth as needed for  moderate constipation., Disp: , Rfl: ;  Fish Oil OIL, Take by mouth every morning. , Disp: , Rfl: ;  GLIPIZIDE PO, Take by mouth 2 (two) times daily. , Disp: , Rfl: ;  losartan (COZAAR) 100 MG tablet, Take 100 mg by mouth daily., Disp: , Rfl:  METFORMIN HCL PO, Take by mouth 2 (two) times daily. , Disp: , Rfl: ;  Multiple Vitamins-Minerals (CHOICEFUL MULTIVITAMIN) CAPS, Take by mouth every morning., Disp: , Rfl: :  :  No Known Allergies:  No family history on file.:  History   Social History  . Marital Status: Single    Spouse Name: N/A    Number of Children: N/A  . Years of Education: N/A   Occupational History  . Not on file.   Social History Main Topics  . Smoking status: Former Smoker -- 0.50 packs/day for 34 years    Types: Cigarettes    Start date: 07/15/1975    Quit date: 05/14/2009  . Smokeless tobacco: Never Used     Comment: quit 5 years ago  . Alcohol Use: No  . Drug Use: No  . Sexual Activity: Not on file   Other Topics Concern  . Not on file   Social History Narrative  . No narrative on file  :  Pertinent items are noted in HPI.  Exam: @IPVITALS @  well-developed  and well-nourished Afro-American female. Vital signs her temperature of 99.2. Pulse 106. Blood pressure 154/75. Her weight is 144 pounds. Head exam shows no ocular or oral is. There is no adenopathy in the neck. Lungs are clear. Cardiac exam regular in rhythm. Abdomen soft. Good bowel sounds. There is no fluid wave. There is no palpable abdominal mass. There is a palpable liver or spleen tip. Exam no tenderness over the spine ribs or hips. Extremities shows no clubbing cyanosis or edema. Neurological exam shows no focal neurological deficits. Skin exam no rashes.    Recent Labs  12/12/13 1456  WBC 9.4  HGB 11.1*  HCT 35.2  PLT 367    Recent Labs  12/12/13 1456  NA 136  K 4.1  CL 100  CO2 25  GLUCOSE 193*  BUN 14  CREATININE 0.84  CALCIUM 9.7    Blood smear review:  none  Pathology:see above    Assessment and Plan: Barbara Matthews is a 58 year old African female. She has a adenocarcinoma of the anus. Again this is a very rare type of tumor for the anus. The past for Torti or squamous cell carcinomas. There is some concern over the possibility of this being a metastatic disease.  We will get a PET scan on her. Again, the CT scan is not sure if it is obvious for her primary ulcers were. She already has had a hysterectomy. She's had no vaginal bleeding.  If we do not find any primary other than the anus, then up I would treat this with radiation chemotherapy. I think the principal of a therapy for adenocarcinoma would still be similar to that for squamous cell carcinoma. There are very, very few reports on treating adenocarcinoma the anus.   I spent over an hour with her. Again, she was crying a lot. I tried to reassure her. I did give her a prayer blanket. This did make her feel better.  We will plan to get her back once the PET scan is done.  Of note, her CEA is minimally elevated at 5.1. Her CEA-125 is 9.9.

## 2013-12-25 ENCOUNTER — Encounter (HOSPITAL_COMMUNITY)
Admission: RE | Admit: 2013-12-25 | Discharge: 2013-12-25 | Disposition: A | Discharge status: Home / Self Care | Payer: BC Managed Care – PPO | Source: Ambulatory Visit | Attending: Hematology & Oncology | Admitting: Hematology & Oncology

## 2013-12-25 DIAGNOSIS — K6289 Other specified diseases of anus and rectum: Secondary | ICD-10-CM

## 2013-12-25 DIAGNOSIS — R198 Other specified symptoms and signs involving the digestive system and abdomen: Secondary | ICD-10-CM | POA: Insufficient documentation

## 2013-12-25 LAB — GLUCOSE, CAPILLARY: GLUCOSE-CAPILLARY: 51 mg/dL — AB (ref 70–99)

## 2013-12-25 MED ORDER — FLUDEOXYGLUCOSE F - 18 (FDG) INJECTION
8.5000 | Freq: Once | INTRAVENOUS | Status: AC | PRN
  Administered 2013-12-25: 8.5 via INTRAVENOUS

## 2013-12-28 ENCOUNTER — Telehealth (INDEPENDENT_AMBULATORY_CARE_PROVIDER_SITE_OTHER): Payer: Self-pay

## 2013-12-28 NOTE — Telephone Encounter (Signed)
LMOM to see if pt has upcoming appt to see a GYN requested by Dr. Marcello Moores. If so, schedule appt to see Dr. Marcello Moores after GYN appt.

## 2014-01-23 ENCOUNTER — Telehealth: Payer: Self-pay | Admitting: Hematology & Oncology

## 2014-01-23 NOTE — Telephone Encounter (Signed)
Pt left message about [facility], per MD he wants to see her 6-22. Pt aware of appointment

## 2014-01-28 ENCOUNTER — Encounter: Payer: Self-pay | Admitting: Hematology & Oncology

## 2014-01-28 ENCOUNTER — Ambulatory Visit (HOSPITAL_BASED_OUTPATIENT_CLINIC_OR_DEPARTMENT_OTHER): Payer: BC Managed Care – PPO | Admitting: Hematology & Oncology

## 2014-01-28 VITALS — BP 150/80 | HR 104 | Temp 98.7°F | Resp 14 | Ht 65.0 in | Wt 138.0 lb

## 2014-01-28 DIAGNOSIS — K6289 Other specified diseases of anus and rectum: Secondary | ICD-10-CM

## 2014-01-28 DIAGNOSIS — R198 Other specified symptoms and signs involving the digestive system and abdomen: Secondary | ICD-10-CM

## 2014-01-29 NOTE — Progress Notes (Signed)
Hematology and Oncology Follow Up Visit  Cloris Flippo [id number] 23-Feb-1956 58 y.o. 01/29/2014   Principle Diagnosis:   Adenocarcinoma of the anus  Current Therapy:    Observation     Interim History:  Ms.  Sandoval is back for followup. Who to go ahead and get a PET scan on her. This was done about a month ago. The PET scan showed activity in the anus and the anorectal junction. There are some small polyps all of those that were not hypermetabolic. There is some small bilateral inguinal nodes. We had  mild amount of uptake. The radiologist felt that these were "concerning" for the possibility of metastatic disease. There is no obvious disease noted distantly.  She has had some bleeding. She feels this is hemorrhoids.  She is not complaining of any pain. Her appetite has been okay. She's lost 6 pounds since we saw her. She's had no cough. She's had no leg swelling. There's been no rashes.  Medications: Current outpatient prescriptions:amLODipine (NORVASC) 10 MG tablet, Take 10 mg by mouth daily., Disp: , Rfl: ;  Fish Oil OIL, Take by mouth every morning. , Disp: , Rfl: ;  GLIPIZIDE PO, Take by mouth 2 (two) times daily. , Disp: , Rfl: ;  Iron-FA-B Cmp-C-Biot-Probiotic (FUSION PLUS) CAPS, Take by mouth every morning. ONLY TAKES WHEN SHE REMEMBERS, Disp: , Rfl: ;  losartan (COZAAR) 100 MG tablet, Take 100 mg by mouth daily., Disp: , Rfl:  METFORMIN HCL PO, Take by mouth 2 (two) times daily. , Disp: , Rfl: ;  Multiple Vitamins-Minerals (CHOICEFUL MULTIVITAMIN) CAPS, Take by mouth every morning., Disp: , Rfl: ;  pravastatin (PRAVACHOL) 40 MG tablet, Take 40 mg by mouth daily. , Disp: , Rfl: ;  bisacodyl (DULCOLAX) 5 MG EC tablet, Take 5 mg by mouth as needed for moderate constipation., Disp: , Rfl:   Allergies: No Known Allergies  Past Medical History, Surgical history, Social history, and Family History were reviewed and updated.  Review of Systems: As above  Physical Exam:  height is 5\' 5"   (1.651 m) and weight is 138 lb (62.596 kg). Her oral temperature is 98.7 F (37.1 C). Her blood pressure is 150/80 and her pulse is 104. Her respiration is 14.   Well-developed and well-nourished Afro-American female. Lungs are clear. Cardiac exam regular in rhythm. Abdomen is soft. She has good bowel sounds. There is no fluid wave. There is no palpable liver or spleen tip. Back exam no tenderness over the spine ribs or hips. Extremities shows no clubbing cyanosis or edema. Skin exam no rashes.  Lab Results  Component Value Date   WBC 9.4 12/12/2013   HGB 11.1* 12/12/2013   HCT 35.2 12/12/2013   MCV 81 12/12/2013   PLT 367 12/12/2013     Chemistry      Component Value Date/Time   NA 136 12/12/2013 1456   K 4.1 12/12/2013 1456   CL 100 12/12/2013 1456   CO2 25 12/12/2013 1456   BUN 14 12/12/2013 1456   CREATININE 0.84 12/12/2013 1456      Component Value Date/Time   CALCIUM 9.7 12/12/2013 1456   ALKPHOS 48 12/12/2013 1456   AST 13 12/12/2013 1456   ALT 13 12/12/2013 1456   BILITOT 0.3 12/12/2013 1456         Impression and Plan: Ms. Clavel is a 58 year old African [place] female. She is a very unusual adenocarcinoma of the anus. This may be of the anorectal junction.  I suspect that she  probably will need to be seen by surgery. From studies done on these adenocarcinomas, they seem to be much less responsive to chemotherapy and radiation therapy and then squamous cell carcinomas.  Of course, I don't know if the inguinal lymph nodes are malignant. I will do this anyway to notice unless he do a biopsy. The lymph nodes are not that large. I cannot feel any on exam.  I spoke with Dr. Zella Richer of surgery. He will see her.  We will get Ms. Topel back want to know what will happen with surgery.   Volanda Napoleon, MD 6/23/20157:03 AM

## 2014-02-05 ENCOUNTER — Encounter (INDEPENDENT_AMBULATORY_CARE_PROVIDER_SITE_OTHER): Payer: Self-pay | Admitting: General Surgery

## 2014-02-05 ENCOUNTER — Ambulatory Visit (INDEPENDENT_AMBULATORY_CARE_PROVIDER_SITE_OTHER): Payer: BC Managed Care – PPO | Admitting: General Surgery

## 2014-02-05 VITALS — BP 155/60 | HR 132 | Temp 98.5°F | Resp 20 | Ht 66.0 in | Wt 141.4 lb

## 2014-02-05 DIAGNOSIS — C210 Malignant neoplasm of anus, unspecified: Secondary | ICD-10-CM | POA: Insufficient documentation

## 2014-02-05 NOTE — Progress Notes (Signed)
Patient ID: Barbara Matthews, female   DOB: [birth year], 58 y.o.   MRN: [id number]  Chief Complaint  Patient presents with  . anal cancer    HPI Barbara Matthews is a 58 y.o. female.   HPI  She noticed what she thought was a hemorrhoid for the past year. He began bleeding. She subsequently saw Dr. Marcello Moores and a biopsy of an anal mass was performed. This was consistent with anal adenocarcinoma. She underwent a colonoscopy and no other suspicious lesions were noted. She was sent to Dr. Marin Olp.  Staging CT scans and a PET scan were performed.  These demonstrated small bilateral inguinal lymph nodes which were mildly hypermetabolic. The lymph nodes were not pathologically enlarged. There was high activity extending through the rectum to the ano- rectal mass.She continues to have intermittent bleeding.  Past Medical History  Diagnosis Date  . Hypertension   . Diabetes mellitus without complication   . Elevated cholesterol   . Hemorrhoids   . Anxiety   . Cancer     Past Surgical History  Procedure Laterality Date  . Abdominal hysterectomy    . Laceration repair    . Colonoscopy N/A 10/19/2013    Procedure: COLONOSCOPY;  Surgeon: Leighton Ruff, MD;  Location: [facility];  Service: Endoscopy;  Laterality: N/A;    History reviewed. No pertinent family history.  Social History History  Substance Use Topics  . Smoking status: Former Smoker -- 0.50 packs/day for 34 years    Types: Cigarettes    Start date: 07/15/1975    Quit date: 05/14/2009  . Smokeless tobacco: Never Used     Comment: quit 5 years ago  . Alcohol Use: No    No Known Allergies  Current Outpatient Prescriptions  Medication Sig Dispense Refill  . amLODipine (NORVASC) 10 MG tablet Take 10 mg by mouth daily.      . Fish Oil OIL Take by mouth every morning.       Marland Kitchen GLIPIZIDE PO Take by mouth 2 (two) times daily.       . Iron-FA-B Cmp-C-Biot-Probiotic (FUSION PLUS) CAPS Take by mouth every morning. ONLY TAKES WHEN SHE  REMEMBERS      . losartan (COZAAR) 100 MG tablet Take 100 mg by mouth daily.      . magnesium hydroxide (MILK OF MAGNESIA) 400 MG/5ML suspension Take 5 mLs by mouth daily as needed for mild constipation.      Marland Kitchen METFORMIN HCL PO Take by mouth 2 (two) times daily.       . Multiple Vitamins-Minerals (CHOICEFUL MULTIVITAMIN) CAPS Take by mouth every morning.      . pravastatin (PRAVACHOL) 40 MG tablet Take 40 mg by mouth daily.        No current facility-administered medications for this visit.    Review of Systems Review of Systems  Constitutional: Positive for unexpected weight change (Weight loss).  Gastrointestinal: Positive for anal bleeding and rectal pain. Negative for abdominal pain.  Genitourinary: Positive for difficulty urinating.    Blood pressure 155/60, pulse 132, temperature 98.5 F (36.9 C), resp. rate 20, height 5\' 6"  (1.676 m), weight 141 lb 6.4 oz (64.139 kg).  Physical Exam Physical Exam  Constitutional: She appears well-developed and well-nourished. No distress.  HENT:  Head: Normocephalic and atraumatic.  Abdominal: Soft. She exhibits no distension and no mass. There is no tenderness.  Lower midline scar  Genitourinary:  Exophytic mass protruding posteriorly out of the anus. It is very friable and bleeds when touched. On digital  rectal exam it extends up into the anus.  Borderline enlarged lymph node palpable in the left inguinal area.  Neurological: She is alert.  Psychiatric:  She is very anxious.    Data Reviewed CTs. PET scan. Note from Dr. Marcello Moores. Pathology report. Note from Dr.  Marin Olp.  Assessment    Adenocarcinoma of the anus. We discussed the optimal treatment which would be abdominoperineal resection with permanent colostomy.   Adenocarcinoma of the anus does not respond as well to chemotherapy and radiation as squamous cell. I  explained the procedure and risks.  Risks include but are not limited to bleeding, infection, wound problems,  anesthesia, problems with colostomy, injury to intraabominal organs (such as intestine, spleen, kidney, bladder, ureter, etc.), ileus.       Plan    She wants to discuss everything with her family and get back to [place] with her decision.         ROSENBOWER,TODD J 02/05/2014, 5:21 PM

## 2014-02-05 NOTE — Patient Instructions (Signed)
You have anal adenocarcinoma.  Please call us when you decide whether or not you want to have the surgery we discussed.  [phone], ask for [facility].

## 2014-02-11 ENCOUNTER — Encounter (INDEPENDENT_AMBULATORY_CARE_PROVIDER_SITE_OTHER): Payer: Self-pay

## 2014-02-11 NOTE — Progress Notes (Signed)
Patient ID: Barbara Matthews, female   DOB: Oct 14, 1955, 58 y.o.   MRN: [id number] Pt was seen on 02/05/14 by Dr. Zella Richer.  She has been diagnosed with adenocarcinoma of the rectum and was here to discuss her options.  Pt presented to the office today requesting her records which consist of Dr. Bertrum Sol office note, PET scan report and CT report.  She has decided to seek a second opinion.

## 2014-03-26 ENCOUNTER — Telehealth: Payer: Self-pay | Admitting: Hematology & Oncology

## 2014-03-26 NOTE — Telephone Encounter (Signed)
Faxed Medical Records via fax today  to:  [facility] [place] [facility] Ph: [phone] [id number] Fx: [phone]   Medical  Records requested from 08/2011 to present   [place] SCANNED

## 2014-08-09 HISTORY — PX: ILEOSTOMY: SHX1783

## 2015-04-30 ENCOUNTER — Inpatient Hospital Stay (HOSPITAL_BASED_OUTPATIENT_CLINIC_OR_DEPARTMENT_OTHER)
Admission: EM | Admit: 2015-04-30 | Discharge: 2015-05-05 | DRG: 388 | Disposition: A | Discharge status: Other Acute Inpatient Hospital | Payer: BC Managed Care – PPO | Attending: Internal Medicine | Admitting: Internal Medicine

## 2015-04-30 ENCOUNTER — Inpatient Hospital Stay (HOSPITAL_COMMUNITY): Payer: BC Managed Care – PPO

## 2015-04-30 ENCOUNTER — Emergency Department (HOSPITAL_BASED_OUTPATIENT_CLINIC_OR_DEPARTMENT_OTHER): Payer: BC Managed Care – PPO

## 2015-04-30 ENCOUNTER — Encounter (HOSPITAL_BASED_OUTPATIENT_CLINIC_OR_DEPARTMENT_OTHER): Payer: Self-pay | Admitting: Emergency Medicine

## 2015-04-30 DIAGNOSIS — E872 Acidosis, unspecified: Secondary | ICD-10-CM | POA: Diagnosis present

## 2015-04-30 DIAGNOSIS — Z933 Colostomy status: Secondary | ICD-10-CM

## 2015-04-30 DIAGNOSIS — Z87891 Personal history of nicotine dependence: Secondary | ICD-10-CM | POA: Diagnosis not present

## 2015-04-30 DIAGNOSIS — K559 Vascular disorder of intestine, unspecified: Secondary | ICD-10-CM | POA: Diagnosis present

## 2015-04-30 DIAGNOSIS — R911 Solitary pulmonary nodule: Secondary | ICD-10-CM | POA: Diagnosis not present

## 2015-04-30 DIAGNOSIS — N141 Nephropathy induced by other drugs, medicaments and biological substances: Secondary | ICD-10-CM | POA: Diagnosis not present

## 2015-04-30 DIAGNOSIS — E1165 Type 2 diabetes mellitus with hyperglycemia: Secondary | ICD-10-CM | POA: Diagnosis present

## 2015-04-30 DIAGNOSIS — E876 Hypokalemia: Secondary | ICD-10-CM | POA: Diagnosis not present

## 2015-04-30 DIAGNOSIS — M25511 Pain in right shoulder: Secondary | ICD-10-CM | POA: Diagnosis present

## 2015-04-30 DIAGNOSIS — C7800 Secondary malignant neoplasm of unspecified lung: Secondary | ICD-10-CM | POA: Diagnosis present

## 2015-04-30 DIAGNOSIS — C20 Malignant neoplasm of rectum: Secondary | ICD-10-CM | POA: Diagnosis not present

## 2015-04-30 DIAGNOSIS — K5660 Unspecified intestinal obstruction: Secondary | ICD-10-CM | POA: Diagnosis present

## 2015-04-30 DIAGNOSIS — E1140 Type 2 diabetes mellitus with diabetic neuropathy, unspecified: Secondary | ICD-10-CM | POA: Diagnosis present

## 2015-04-30 DIAGNOSIS — C210 Malignant neoplasm of anus, unspecified: Secondary | ICD-10-CM | POA: Diagnosis present

## 2015-04-30 DIAGNOSIS — Z0189 Encounter for other specified special examinations: Secondary | ICD-10-CM

## 2015-04-30 DIAGNOSIS — Z79899 Other long term (current) drug therapy: Secondary | ICD-10-CM

## 2015-04-30 DIAGNOSIS — E860 Dehydration: Secondary | ICD-10-CM | POA: Diagnosis present

## 2015-04-30 DIAGNOSIS — K5669 Other intestinal obstruction: Secondary | ICD-10-CM | POA: Diagnosis not present

## 2015-04-30 DIAGNOSIS — A419 Sepsis, unspecified organism: Secondary | ICD-10-CM | POA: Diagnosis present

## 2015-04-30 DIAGNOSIS — J189 Pneumonia, unspecified organism: Secondary | ICD-10-CM | POA: Diagnosis present

## 2015-04-30 DIAGNOSIS — I10 Essential (primary) hypertension: Secondary | ICD-10-CM | POA: Diagnosis present

## 2015-04-30 DIAGNOSIS — F419 Anxiety disorder, unspecified: Secondary | ICD-10-CM | POA: Diagnosis not present

## 2015-04-30 DIAGNOSIS — E780 Pure hypercholesterolemia: Secondary | ICD-10-CM | POA: Diagnosis not present

## 2015-04-30 DIAGNOSIS — Y95 Nosocomial condition: Secondary | ICD-10-CM | POA: Diagnosis present

## 2015-04-30 DIAGNOSIS — R0602 Shortness of breath: Secondary | ICD-10-CM

## 2015-04-30 DIAGNOSIS — Z90710 Acquired absence of both cervix and uterus: Secondary | ICD-10-CM

## 2015-04-30 DIAGNOSIS — Z95828 Presence of other vascular implants and grafts: Secondary | ICD-10-CM

## 2015-04-30 DIAGNOSIS — N179 Acute kidney failure, unspecified: Secondary | ICD-10-CM | POA: Diagnosis present

## 2015-04-30 DIAGNOSIS — T508X5A Adverse effect of diagnostic agents, initial encounter: Secondary | ICD-10-CM | POA: Diagnosis present

## 2015-04-30 DIAGNOSIS — IMO0002 Reserved for concepts with insufficient information to code with codable children: Secondary | ICD-10-CM | POA: Diagnosis present

## 2015-04-30 DIAGNOSIS — R000 Tachycardia, unspecified: Secondary | ICD-10-CM | POA: Diagnosis not present

## 2015-04-30 DIAGNOSIS — K56609 Unspecified intestinal obstruction, unspecified as to partial versus complete obstruction: Secondary | ICD-10-CM | POA: Diagnosis present

## 2015-04-30 DIAGNOSIS — E118 Type 2 diabetes mellitus with unspecified complications: Secondary | ICD-10-CM

## 2015-04-30 HISTORY — DX: Colostomy status: Z93.3

## 2015-04-30 LAB — COMPREHENSIVE METABOLIC PANEL
ALT: 22 U/L (ref 14–54)
AST: 24 U/L (ref 15–41)
Albumin: 4 g/dL (ref 3.5–5.0)
Alkaline Phosphatase: 83 U/L (ref 38–126)
Anion gap: 12 (ref 5–15)
BUN: 15 mg/dL (ref 6–20)
CHLORIDE: 101 mmol/L (ref 101–111)
CO2: 24 mmol/L (ref 22–32)
CREATININE: 0.72 mg/dL (ref 0.44–1.00)
Calcium: 9.8 mg/dL (ref 8.9–10.3)
GFR calc Af Amer: >60 mL/min (ref >60)
GFR calc non Af Amer: >60 mL/min (ref >60)
Glucose, Bld: 349 mg/dL — ABNORMAL HIGH (ref 65–99)
Potassium: 3.1 mmol/L — ABNORMAL LOW (ref 3.5–5.1)
SODIUM: 137 mmol/L (ref 135–145)
Total Bilirubin: 0.6 mg/dL (ref 0.3–1.2)
Total Protein: 7.5 g/dL (ref 6.5–8.1)

## 2015-04-30 LAB — LIPASE, BLOOD: Lipase: 56 U/L — ABNORMAL HIGH (ref 22–51)

## 2015-04-30 LAB — I-STAT CG4 LACTIC ACID, ED: LACTIC ACID, VENOUS: 3.16 mmol/L — AB (ref 0.5–2.0)

## 2015-04-30 LAB — TROPONIN I: Troponin I: <0.03 ng/mL (ref <0.031)

## 2015-04-30 LAB — URINALYSIS, ROUTINE W REFLEX MICROSCOPIC
Bilirubin Urine: NEGATIVE
Ketones, ur: 15 mg/dL — AB
Leukocytes, UA: NEGATIVE
Nitrite: NEGATIVE
Protein, ur: NEGATIVE mg/dL
SPECIFIC GRAVITY, URINE: 1.018 (ref 1.005–1.030)
Urobilinogen, UA: 0.2 mg/dL (ref 0.0–1.0)
pH: 7 (ref 5.0–8.0)

## 2015-04-30 LAB — CBC WITH DIFFERENTIAL/PLATELET
Basophils Absolute: 0 10*3/uL (ref 0.0–0.1)
Basophils Relative: 0 %
EOS ABS: 0 10*3/uL (ref 0.0–0.7)
EOS PCT: 0 %
HCT: 35.6 % — ABNORMAL LOW (ref 36.0–46.0)
Hemoglobin: 11.6 g/dL — ABNORMAL LOW (ref 12.0–15.0)
Lymphocytes Relative: 8 %
Lymphs Abs: 1 10*3/uL (ref 0.7–4.0)
MCH: 24.8 pg — ABNORMAL LOW (ref 26.0–34.0)
MCHC: 32.6 g/dL (ref 30.0–36.0)
MCV: 76.1 fL — ABNORMAL LOW (ref 78.0–100.0)
Monocytes Absolute: 0.6 10*3/uL (ref 0.1–1.0)
Monocytes Relative: 5 %
NEUTROS PCT: 87 %
Neutro Abs: 10.8 10*3/uL — ABNORMAL HIGH (ref 1.7–7.7)
PLATELETS: 235 10*3/uL (ref 150–400)
RBC: 4.68 MIL/uL (ref 3.87–5.11)
RDW: 17.2 % — ABNORMAL HIGH (ref 11.5–15.5)
WBC: 12.4 10*3/uL — ABNORMAL HIGH (ref 4.0–10.5)

## 2015-04-30 LAB — URINE MICROSCOPIC-ADD ON

## 2015-04-30 LAB — CBC
HCT: 35.5 % — ABNORMAL LOW (ref 36.0–46.0)
Hemoglobin: 11.5 g/dL — ABNORMAL LOW (ref 12.0–15.0)
MCH: 25 pg — AB (ref 26.0–34.0)
MCHC: 32.4 g/dL (ref 30.0–36.0)
MCV: 77.2 fL — ABNORMAL LOW (ref 78.0–100.0)
PLATELETS: 244 10*3/uL (ref 150–400)
RBC: 4.6 MIL/uL (ref 3.87–5.11)
RDW: 17.3 % — AB (ref 11.5–15.5)
WBC: 13 10*3/uL — ABNORMAL HIGH (ref 4.0–10.5)

## 2015-04-30 LAB — CREATININE, SERUM
CREATININE: 0.59 mg/dL (ref 0.44–1.00)
GFR calc Af Amer: >60 mL/min (ref >60)
GFR calc non Af Amer: >60 mL/min (ref >60)

## 2015-04-30 LAB — CBG MONITORING, ED
Glucose-Capillary: 290 mg/dL — ABNORMAL HIGH (ref 65–99)
Glucose-Capillary: 283 mg/dL — ABNORMAL HIGH (ref 65–99)

## 2015-04-30 LAB — GLUCOSE, CAPILLARY
GLUCOSE-CAPILLARY: 247 mg/dL — AB (ref 65–99)
GLUCOSE-CAPILLARY: 224 mg/dL — AB (ref 65–99)

## 2015-04-30 LAB — MAGNESIUM: MAGNESIUM: 1.2 mg/dL — AB (ref 1.7–2.4)

## 2015-04-30 MED ORDER — LEVALBUTEROL HCL 0.63 MG/3ML IN NEBU: 0.6300 mg | INHALATION_SOLUTION | Freq: Four times a day (QID) | RESPIRATORY_TRACT | Status: DC | PRN

## 2015-04-30 MED ORDER — IOHEXOL 300 MG/ML  SOLN
25.0000 mL | Freq: Once | INTRAMUSCULAR | Status: AC | PRN
  Administered 2015-04-30: 25 mL via ORAL

## 2015-04-30 MED ORDER — SODIUM CHLORIDE 0.9 % IV SOLN
INTRAVENOUS | Status: DC
Start: 2015-04-30 — End: 2015-05-04
  Administered 2015-04-30 – 2015-05-03 (×2): via INTRAVENOUS

## 2015-04-30 MED ORDER — HYDROMORPHONE HCL 1 MG/ML IJ SOLN
0.5000 mg | INTRAMUSCULAR | Status: DC | PRN
  Administered 2015-04-30 – 2015-05-05 (×14): 0.5 mg via INTRAVENOUS
  Filled 2015-04-30 (×14): qty 1

## 2015-04-30 MED ORDER — HYDRALAZINE HCL 20 MG/ML IJ SOLN: 10.0000 mg | Freq: Four times a day (QID) | INTRAMUSCULAR | Status: DC | PRN

## 2015-04-30 MED ORDER — SODIUM CHLORIDE 0.9 % IV BOLUS (SEPSIS)
1000.0000 mL | Freq: Once | INTRAVENOUS | Status: AC
  Administered 2015-04-30: 1000 mL via INTRAVENOUS

## 2015-04-30 MED ORDER — ONDANSETRON HCL 4 MG/2ML IJ SOLN: 4.0000 mg | Freq: Four times a day (QID) | INTRAMUSCULAR | Status: DC | PRN

## 2015-04-30 MED ORDER — INSULIN ASPART 100 UNIT/ML ~~LOC~~ SOLN
0.0000 [IU] | Freq: Three times a day (TID) | SUBCUTANEOUS | Status: DC
  Administered 2015-04-30: 3 [IU] via SUBCUTANEOUS

## 2015-04-30 MED ORDER — METOCLOPRAMIDE HCL 5 MG/ML IJ SOLN
10.0000 mg | Freq: Once | INTRAMUSCULAR | Status: AC
  Administered 2015-04-30: 10 mg via INTRAVENOUS
  Filled 2015-04-30: qty 2

## 2015-04-30 MED ORDER — ACETAMINOPHEN 325 MG PO TABS
650.0000 mg | ORAL_TABLET | Freq: Four times a day (QID) | ORAL | Status: DC | PRN
  Administered 2015-05-02: 650 mg via ORAL
  Filled 2015-04-30: qty 2

## 2015-04-30 MED ORDER — HYDROMORPHONE HCL 1 MG/ML IJ SOLN
1.0000 mg | Freq: Once | INTRAMUSCULAR | Status: AC
Start: 2015-04-30 — End: 2015-04-30
  Administered 2015-04-30: 1 mg via INTRAVENOUS
  Filled 2015-04-30: qty 1

## 2015-04-30 MED ORDER — ONDANSETRON HCL 4 MG/2ML IJ SOLN
4.0000 mg | Freq: Once | INTRAMUSCULAR | Status: AC
Start: 2015-04-30 — End: 2015-04-30
  Administered 2015-04-30: 4 mg via INTRAVENOUS
  Filled 2015-04-30: qty 2

## 2015-04-30 MED ORDER — CHLORHEXIDINE GLUCONATE 0.12 % MT SOLN
15.0000 mL | Freq: Two times a day (BID) | OROMUCOSAL | Status: DC
  Administered 2015-04-30 – 2015-05-05 (×10): 15 mL via OROMUCOSAL
  Filled 2015-04-30 (×12): qty 15

## 2015-04-30 MED ORDER — ENOXAPARIN SODIUM 40 MG/0.4ML ~~LOC~~ SOLN
40.0000 mg | SUBCUTANEOUS | Status: DC
  Administered 2015-04-30 – 2015-05-04 (×5): 40 mg via SUBCUTANEOUS
  Filled 2015-04-30 (×6): qty 0.4

## 2015-04-30 MED ORDER — METOCLOPRAMIDE HCL 5 MG/ML IJ SOLN
5.0000 mg | Freq: Four times a day (QID) | INTRAMUSCULAR | Status: DC | PRN
  Administered 2015-05-04 (×2): 5 mg via INTRAVENOUS
  Filled 2015-04-30 (×2): qty 2

## 2015-04-30 MED ORDER — IOHEXOL 350 MG/ML SOLN
100.0000 mL | Freq: Once | INTRAVENOUS | Status: AC | PRN
  Administered 2015-04-30: 100 mL via INTRAVENOUS

## 2015-04-30 MED ORDER — IOHEXOL 300 MG/ML  SOLN
100.0000 mL | Freq: Once | INTRAMUSCULAR | Status: AC | PRN
  Administered 2015-04-30: 100 mL via INTRAVENOUS

## 2015-04-30 MED ORDER — CETYLPYRIDINIUM CHLORIDE 0.05 % MT LIQD
7.0000 mL | Freq: Two times a day (BID) | OROMUCOSAL | Status: DC
  Administered 2015-05-01 – 2015-05-05 (×9): 7 mL via OROMUCOSAL

## 2015-04-30 MED ORDER — ACETAMINOPHEN 650 MG RE SUPP: 650.0000 mg | Freq: Four times a day (QID) | RECTAL | Status: DC | PRN

## 2015-04-30 MED ORDER — SODIUM CHLORIDE 0.9 % IJ SOLN
10.0000 mL | INTRAMUSCULAR | Status: DC | PRN
  Administered 2015-05-01 – 2015-05-05 (×7): 10 mL
  Filled 2015-04-30 (×7): qty 40

## 2015-04-30 MED ORDER — ONDANSETRON HCL 4 MG PO TABS: 4.0000 mg | ORAL_TABLET | Freq: Four times a day (QID) | ORAL | Status: DC | PRN

## 2015-04-30 MED ORDER — SODIUM CHLORIDE 0.9 % IJ SOLN
3.0000 mL | Freq: Two times a day (BID) | INTRAMUSCULAR | Status: DC
  Administered 2015-05-01 – 2015-05-05 (×3): 3 mL via INTRAVENOUS

## 2015-04-30 NOTE — Consult Note (Signed)
Reason for Consult:  SBO Referring Physician: Dr. Derrek [facility] currently followed at [facility] is an 59 y.o. female.   CC:  Pt reports acute onset of nausea, vomiting and abdominal pain this AM  She emptied her colostomy this AM, but almost no output since she emptied it this AM.  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 She then saw Dr. Zella Richer on 02/05/14, who discussed options with her.  This included abdominoperineal resection with permanent colostomy.  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  She went to another facility for second opinion and now presents to [facility] with acute abdominal pain.  Work up shows: K+ 3.1, WBC is up, lactate is 3.16. Glucose in her urine.  CT shows: Enlarging pulmonary nodules in the lung bases, left and right. Left upper quadrant ostomy noted. There are mildly prominent small bowel loop in s in the pelvis with air-fluid levels. Cannot completely exclude partial early low grade small bowel obstruction in the pelvis. No well-defined transition point.  She had her surgery, radiation and chemotherapy at [facility].  Her last visit was last week and she was scheduled for bx today for pulmonary nodules at [facility] today.  She presented to ED and was transferred to [facility] for evaluation.   Past Medical History  Diagnosis Date  Adenocarcinoma of the Anus   Hypertension  Diabetes mellitus without complication     Elevated cholesterol  Hemorrhoids  Anxiety     Hx of tobacco use           Past Surgical History  Procedure Laterality Date  . Abdominal hysterectomy    . Laceration repair    . Colonoscopy N/A 10/19/2013    Procedure: COLONOSCOPY;  Surgeon: Leighton Ruff, MD;  Location: [facility] ENDOSCOPY;  Service: Endoscopy;  Laterality: N/A;    History reviewed. No pertinent family history.  Social History:  reports that she quit  smoking about 5 years ago. Her smoking use included Cigarettes. She started smoking about 39 years ago. She has a 17 pack-year smoking history. She has never used smokeless tobacco. She reports that she does not drink alcohol or use illicit drugs.  Allergies: No Known Allergies  Prior to Admission medications   Medication Sig Start Date End Date Taking? Authorizing Provider  amLODipine (NORVASC) 10 MG tablet Take 10 mg by mouth daily.   Yes Historical Provider, MD  Fish Oil OIL Take by mouth every morning.    Yes Historical Provider, MD  glipiZIDE (GLUCOTROL) 10 MG tablet Take 10 mg by mouth 2 (two) times daily before a meal.   Yes Historical Provider, MD  Iron-FA-B Cmp-C-Biot-Probiotic (FUSION PLUS) CAPS Take by mouth every morning. ONLY TAKES WHEN SHE REMEMBERS   Yes Historical Provider, MD  losartan (COZAAR) 100 MG tablet Take 100 mg by mouth daily.   Yes Historical Provider, MD  Multiple Vitamins-Minerals (CHOICEFUL MULTIVITAMIN) CAPS Take by mouth every morning.   Yes Historical Provider, MD     Results for orders placed or performed during the hospital encounter of 04/30/15 (from the past 48 hour(s))  POC CBG, ED     Status: Abnormal   Collection Time: 04/30/15  9:08 AM  Result Value Ref Range   Glucose-Capillary 290 (H) 65 - 99 mg/dL  Urinalysis, Routine w reflex microscopic (not at [facility])     Status: Abnormal  Collection Time: 04/30/15  9:35 AM  Result Value Ref Range   Color, Urine YELLOW YELLOW   APPearance CLEAR CLEAR   Specific Gravity, Urine 1.018 1.005 - 1.030   pH 7.0 5.0 - 8.0   Glucose, UA >1000 (A) NEGATIVE mg/dL   Hgb urine dipstick SMALL (A) NEGATIVE   Bilirubin Urine NEGATIVE NEGATIVE   Ketones, ur 15 (A) NEGATIVE mg/dL   Protein, ur NEGATIVE NEGATIVE mg/dL   Urobilinogen, UA 0.2 0.0 - 1.0 mg/dL   Nitrite NEGATIVE NEGATIVE   Leukocytes, UA NEGATIVE NEGATIVE  Urine microscopic-add on     Status: None   Collection Time: 04/30/15  9:35 AM  Result Value Ref Range    Squamous Epithelial / LPF RARE RARE   WBC, UA 0-2 <3 WBC/hpf   RBC / HPF 0-2 <3 RBC/hpf   Bacteria, UA RARE RARE  CBC with Differential     Status: Abnormal   Collection Time: 04/30/15  9:40 AM  Result Value Ref Range   WBC 12.4 (H) 4.0 - 10.5 K/uL   RBC 4.68 3.87 - 5.11 MIL/uL   Hemoglobin 11.6 (L) 12.0 - 15.0 g/dL   HCT 35.6 (L) 36.0 - 46.0 %   MCV 76.1 (L) 78.0 - 100.0 fL   MCH 24.8 (L) 26.0 - 34.0 pg   MCHC 32.6 30.0 - 36.0 g/dL   RDW 17.2 (H) 11.5 - 15.5 %   Platelets 235 150 - 400 K/uL   Neutrophils Relative % 87 %   Neutro Abs 10.8 (H) 1.7 - 7.7 K/uL   Lymphocytes Relative 8 %   Lymphs Abs 1.0 0.7 - 4.0 K/uL   Monocytes Relative 5 %   Monocytes Absolute 0.6 0.1 - 1.0 K/uL   Eosinophils Relative 0 %   Eosinophils Absolute 0.0 0.0 - 0.7 K/uL   Basophils Relative 0 %   Basophils Absolute 0.0 0.0 - 0.1 K/uL  Comprehensive metabolic panel     Status: Abnormal   Collection Time: 04/30/15  9:40 AM  Result Value Ref Range   Sodium 137 135 - 145 mmol/L   Potassium 3.1 (L) 3.5 - 5.1 mmol/L   Chloride 101 101 - 111 mmol/L   CO2 24 22 - 32 mmol/L   Glucose, Bld 349 (H) 65 - 99 mg/dL   BUN 15 6 - 20 mg/dL   Creatinine, Ser 0.72 0.44 - 1.00 mg/dL   Calcium 9.8 8.9 - 10.3 mg/dL   Total Protein 7.5 6.5 - 8.1 g/dL   Albumin 4.0 3.5 - 5.0 g/dL   AST 24 15 - 41 U/L   ALT 22 14 - 54 U/L   Alkaline Phosphatase 83 38 - 126 U/L   Total Bilirubin 0.6 0.3 - 1.2 mg/dL   GFR calc non Af Amer >60 >60 mL/min   GFR calc Af Amer >60 >60 mL/min    Comment: (NOTE) The eGFR has been calculated using the CKD EPI equation. This calculation has not been validated in all clinical situations. eGFR's persistently <60 mL/min signify possible Chronic Kidney Disease.    Anion gap 12 5 - 15  Lipase, blood     Status: Abnormal   Collection Time: 04/30/15  9:40 AM  Result Value Ref Range   Lipase 56 (H) 22 - 51 U/L  I-Stat CG4 Lactic Acid, ED     Status: Abnormal   Collection Time: 04/30/15  11:41 AM  Result Value Ref Range   Lactic Acid, Venous 3.16 (HH) 0.5 - 2.0 mmol/L   Comment  NOTIFIED PHYSICIAN   POC CBG, ED     Status: Abnormal   Collection Time: 04/30/15  1:26 PM  Result Value Ref Range   Glucose-Capillary 283 (H) 65 - 99 mg/dL    Ct Abdomen Pelvis W Contrast  04/30/2015   CLINICAL DATA:  Abdominal pain, back pain, nausea, vomiting. History of anal cancer. Last chemotherapy treatment in April.  EXAM: CT ABDOMEN AND PELVIS WITH CONTRAST  TECHNIQUE: Multidetector CT imaging of the abdomen and pelvis was performed using the standard protocol following bolus administration of intravenous contrast.  CONTRAST:  48m OMNIPAQUE IOHEXOL 300 MG/ML SOLN, 1015mOMNIPAQUE IOHEXOL 300 MG/ML SOLN  COMPARISON:  PET CT 12/25/2013  FINDINGS: Enlarging pulmonary nodules in the lung bases. Partially cavitary nodule in the right lower lobe measures 11 mm on image 9 compared with 3 mm previously. 11 mm nodule in the left lower lobe on image 8 previously measured 5 mm. Second enlarging left lower lobe nodule on image 9 in the posterior left lower lobe. These are most compatible with metastases. Heart is normal size. No pleural effusions.  Liver, gallbladder, spleen, pancreas, adrenals and kidneys are unremarkable.  Left upper quadrant ostomy noted. There are mildly prominent small bowel loop in s in the pelvis with air-fluid levels. Cannot completely exclude partial early low grade small bowel obstruction in the pelvis. No well-defined transition point.  Small amount of free fluid in the pelvis. Urinary bladder is unremarkable. Prior hysterectomy. Aorta is normal caliber. No adenopathy or free air. No acute bony abnormality or focal bone lesion. Degenerative changes in the lumbar spine.  IMPRESSION: Postoperative changes with left abdominal ostomy small bowel obstruction or focal ileus.  Small amount of free fluid in the pelvis.  Enlarging bilateral lower lobe pulmonary nodules compatible with metastases.    Electronically Signed   By: KeRolm Baptise.D.   On: 04/30/2015 11:00    Review of Systems  Constitutional: Negative.   HENT: Negative.   Eyes: Negative.   Respiratory: Negative.   Cardiovascular: Negative.   Gastrointestinal: Positive for nausea, vomiting and abdominal pain. Negative for diarrhea, constipation and blood in stool.  Genitourinary: Negative.   Musculoskeletal: Negative.   Skin: Negative.   Neurological: Negative.  Negative for headaches.  Endo/Heme/Allergies: Negative.   Psychiatric/Behavioral: Negative.    Blood pressure 153/71, pulse 118, temperature 98.8 F (37.1 C), temperature source Oral, resp. rate 18, SpO2 100 %. Physical Exam  Constitutional: She is oriented to person, place, and time. She appears well-developed and well-nourished. No distress.  NG in place and very uncomfortable.  HENT:  Head: Normocephalic.  Nose: Nose normal.  Eyes: Conjunctivae and EOM are normal. Right eye exhibits no discharge. Left eye exhibits no discharge. No scleral icterus.  Neck: Normal range of motion. Neck supple. No JVD present. No tracheal deviation present. No thyromegaly present.  Cardiovascular: Normal rate, regular rhythm, normal heart sounds and intact distal pulses.   No murmur heard. Respiratory: Effort normal and breath sounds normal. No respiratory distress. She has no wheezes. She has no rales. She exhibits no tenderness.  GI: Soft. She exhibits no distension and no mass. There is no tenderness. There is no rebound and no guarding.  Very few bowel sounds  Musculoskeletal: She exhibits no edema or tenderness.  Lymphadenopathy:    She has no cervical adenopathy.  Neurological: She is alert and oriented to person, place, and time. No cranial nerve deficit.  Skin: Skin is warm and dry. No rash noted. She is not  diaphoretic. No erythema. No pallor.  Psychiatric: She has a normal mood and affect. Her behavior is normal. Judgment and thought content normal.     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  Will follow with you.  JENNINGS,WILLARD 04/30/2015, 3:41 PM

## 2015-04-30 NOTE — ED Provider Notes (Signed)
CSN: [id number]     Arrival date & time 04/30/15  4503 History   First MD Initiated Contact with Patient 04/30/15 [phone]     Chief Complaint  Patient presents with  . Abdominal Pain     (Consider location/radiation/quality/duration/timing/severity/associated sxs/prior Treatment) Patient is a 59 y.o. female presenting with abdominal pain. The history is provided by the patient.  Abdominal Pain Pain location:  LLQ and RLQ Pain quality: aching and bloating   Pain radiates to:  Does not radiate Pain severity:  Mild Onset quality:  Gradual Duration:  6 hours Timing:  Constant Chronicity:  New Relieved by:  None tried Worsened by:  Nothing tried Ineffective treatments:  None tried Associated symptoms: constipation, nausea and vomiting   Associated symptoms: no anorexia, no chills, no cough, no dysuria, no fatigue, no fever, no shortness of breath, no vaginal bleeding and no vaginal discharge     Past Medical History  Diagnosis Date  . Hypertension   . Diabetes mellitus without complication   . Elevated cholesterol   . Hemorrhoids   . Anxiety   . Cancer   . Status post colostomy    Past Surgical History  Procedure Laterality Date  . Abdominal hysterectomy    . Laceration repair    . Colonoscopy N/A 10/19/2013    Procedure: COLONOSCOPY;  Surgeon: Leighton Ruff, MD;  Location: [facility];  Service: Endoscopy;  Laterality: N/A;   History reviewed. No pertinent family history. Social History  Substance Use Topics  . Smoking status: Former Smoker -- 0.50 packs/day for 34 years    Types: Cigarettes    Start date: 07/15/1975    Quit date: 05/14/2009  . Smokeless tobacco: Never Used     Comment: quit 5 years ago  . Alcohol Use: No   OB History    No data available     Review of Systems  Constitutional: Negative for fever, chills and fatigue.  Eyes: Negative for pain.  Respiratory: Negative for cough and shortness of breath.   Gastrointestinal: Positive for nausea,  vomiting, abdominal pain and constipation. Negative for anorexia.  Genitourinary: Negative for dysuria, urgency, flank pain, vaginal bleeding and vaginal discharge.  All other systems reviewed and are negative.     Allergies  Review of patient's allergies indicates no known allergies.  Home Medications   Prior to Admission medications   Medication Sig Start Date End Date Taking? Authorizing Provider  amLODipine (NORVASC) 10 MG tablet Take 10 mg by mouth daily.   Yes Historical Provider, MD  Fish Oil OIL Take by mouth every morning.    Yes Historical Provider, MD  glipiZIDE (GLUCOTROL) 10 MG tablet Take 10 mg by mouth 2 (two) times daily before a meal.   Yes Historical Provider, MD  Iron-FA-B Cmp-C-Biot-Probiotic (FUSION PLUS) CAPS Take by mouth every morning. ONLY TAKES WHEN SHE REMEMBERS   Yes Historical Provider, MD  losartan (COZAAR) 100 MG tablet Take 100 mg by mouth daily.   Yes Historical Provider, MD  Multiple Vitamins-Minerals (CHOICEFUL MULTIVITAMIN) CAPS Take by mouth every morning.   Yes Historical Provider, MD   BP 153/71 mmHg  Pulse 118  Temp(Src) 98.8 F (37.1 C) (Oral)  Resp 18  SpO2 100% Physical Exam  Constitutional: She is oriented to person, place, and time. She appears well-developed and well-nourished.  HENT:  Head: Normocephalic and atraumatic.  Eyes: Conjunctivae and EOM are normal. Right eye exhibits no discharge. Left eye exhibits no discharge.  Cardiovascular: Regular rhythm.  Tachycardia present.  Pulmonary/Chest: Effort normal and breath sounds normal. No respiratory distress.  Abdominal: Soft. She exhibits no distension. There is no tenderness. There is no rebound.  Musculoskeletal: Normal range of motion. She exhibits no edema or tenderness.  Neurological: She is alert and oriented to person, place, and time.  Skin: Skin is warm and dry.  Nursing note and vitals reviewed.   ED Course  Procedures (including critical care time) Labs Review Labs  Reviewed  CBC WITH DIFFERENTIAL/PLATELET - Abnormal; Notable for the following:    WBC 12.4 (*)    Hemoglobin 11.6 (*)    HCT 35.6 (*)    MCV 76.1 (*)    MCH 24.8 (*)    RDW 17.2 (*)    Neutro Abs 10.8 (*)    All other components within normal limits  COMPREHENSIVE METABOLIC PANEL - Abnormal; Notable for the following:    Potassium 3.1 (*)    Glucose, Bld 349 (*)    All other components within normal limits  URINALYSIS, ROUTINE W REFLEX MICROSCOPIC (NOT AT [facility]) - Abnormal; Notable for the following:    Glucose, UA >1000 (*)    Hgb urine dipstick SMALL (*)    Ketones, ur 15 (*)    All other components within normal limits  LIPASE, BLOOD - Abnormal; Notable for the following:    Lipase 56 (*)    All other components within normal limits  CBG MONITORING, ED - Abnormal; Notable for the following:    Glucose-Capillary 290 (*)    All other components within normal limits  I-STAT CG4 LACTIC ACID, ED - Abnormal; Notable for the following:    Lactic Acid, Venous 3.16 (*)    All other components within normal limits  CBG MONITORING, ED - Abnormal; Notable for the following:    Glucose-Capillary 283 (*)    All other components within normal limits  URINE MICROSCOPIC-ADD ON  CBC  CREATININE, SERUM  MAGNESIUM  TROPONIN I  TROPONIN I  TROPONIN I  HEMOGLOBIN A1C  I-STAT CG4 LACTIC ACID, ED    Imaging Review Ct Abdomen Pelvis W Contrast  04/30/2015   CLINICAL DATA:  Abdominal pain, back pain, nausea, vomiting. History of anal cancer. Last chemotherapy treatment in April.  EXAM: CT ABDOMEN AND PELVIS WITH CONTRAST  TECHNIQUE: Multidetector CT imaging of the abdomen and pelvis was performed using the standard protocol following bolus administration of intravenous contrast.  CONTRAST:  22m OMNIPAQUE IOHEXOL 300 MG/ML SOLN, 1060mOMNIPAQUE IOHEXOL 300 MG/ML SOLN  COMPARISON:  PET CT 12/25/2013  FINDINGS: Enlarging pulmonary nodules in the lung bases. Partially cavitary nodule in the right  lower lobe measures 11 mm on image 9 compared with 3 mm previously. 11 mm nodule in the left lower lobe on image 8 previously measured 5 mm. Second enlarging left lower lobe nodule on image 9 in the posterior left lower lobe. These are most compatible with metastases. Heart is normal size. No pleural effusions.  Liver, gallbladder, spleen, pancreas, adrenals and kidneys are unremarkable.  Left upper quadrant ostomy noted. There are mildly prominent small bowel loop in s in the pelvis with air-fluid levels. Cannot completely exclude partial early low grade small bowel obstruction in the pelvis. No well-defined transition point.  Small amount of free fluid in the pelvis. Urinary bladder is unremarkable. Prior hysterectomy. Aorta is normal caliber. No adenopathy or free air. No acute bony abnormality or focal bone lesion. Degenerative changes in the lumbar spine.  IMPRESSION: Postoperative changes with left abdominal ostomy small bowel  obstruction or focal ileus.  Small amount of free fluid in the pelvis.  Enlarging bilateral lower lobe pulmonary nodules compatible with metastases.   Electronically Signed   By: Rolm Baptise M.D.   On: 04/30/2015 11:00   I have personally reviewed and evaluated these images and lab results as part of my medical decision-making.   EKG Interpretation None      MDM   Final diagnoses:  Intestinal obstruction, unspecified intestinal obstruction type   Patient with llq anr rlq pain since this AM with n/v as well. No similar episodes previously. Exam with benign abdomen but patient rolling around bed in pain. ddx large includes: obstruction, abscess, colitis, ostomy complication. Will check labs, CT, symptomatic treatment and reassess.   CT with evidence of obstruction. NG placed, lactic acid up, leukocytosis. Still tachy after fluids.   Spoke with Dr. Charma Igo, attempted transfer to [facility] however they have no beds and likely won't for another few days.   D/w our surgery team  and medicine team and will admit to [facility].     Merrily Pew, MD 04/30/15 [phone]

## 2015-04-30 NOTE — H&P (Signed)
[facility] History and Physical  Meredith Kilbride IWP:[id number] DOB: May 10, 1956 DOA: 04/30/2015  Referring physician: * ** PCP: Anselmo Pickler, DO   Chief Complaint: Small bowel obstruction  HPI:  59 year old female with a history of adenocarcinoma of the rectum, status post surgery at [facility] with a permanent colostomy in 2015 presented with acute onset of abdominal pain 6 hours prior to presentation to the ER. She complained of left and right lower quadrant pain associated with nausea and vomiting. CT abdomen and pelvis shows evidence of obstruction and the patient was transferred to the long for small bowel obstruction. Patient tried to transferred to [facility], however they did not have any beds. Therefore the patient transferred to [facility]  long. Patient does have occasional shortness of breath, chest pain. Former smoker. In the ED the patient was found to be tachycardic, normotensive, elevated white count, 100% on room air. Patient has an NG tube in place.    Review of Systems: negative for the following  Constitutional: Denies fever, chills, diaphoresis, appetite change and fatigue.  HEENT: Denies photophobia, eye pain, redness, hearing loss, ear pain, congestion, sore throat, rhinorrhea, sneezing, mouth sores, trouble swallowing, neck pain, neck stiffness and tinnitus.  Respiratory: Denies SOB, DOE, cough, chest tightness, and wheezing.  Cardiovascular: Denies chest pain, palpitations and leg swelling.  Gastrointestinal: Positive for nausea, vomiting, abdominal pain and constipation  Genitourinary: Denies dysuria, urgency, frequency, hematuria, flank pain and difficulty urinating.  Musculoskeletal: Denies myalgias, back pain, joint swelling, arthralgias and gait problem.  Skin: Denies pallor, rash and wound.  Neurological: Denies dizziness, seizures, syncope, weakness, light-headedness, numbness and headaches.  Hematological: Denies adenopathy. Easy bruising, personal or family  bleeding history  Psychiatric/Behavioral: Denies suicidal ideation, mood changes, confusion, nervousness, sleep disturbance and agitation       Past Medical History  Diagnosis Date  . Hypertension   . Diabetes mellitus without complication   . Elevated cholesterol   . Hemorrhoids   . Anxiety   . Cancer   . Status post colostomy      Past Surgical History  Procedure Laterality Date  . Abdominal hysterectomy    . Laceration repair    . Colonoscopy N/A 10/19/2013    Procedure: COLONOSCOPY;  Surgeon: Leighton Ruff, MD;  Location: [facility] ENDOSCOPY;  Service: Endoscopy;  Laterality: N/A;      Social History:  reports that she quit smoking about 5 years ago. Her smoking use included Cigarettes. She started smoking about 39 years ago. She has a 17 pack-year smoking history. She has never used smokeless tobacco. She reports that she does not drink alcohol or use illicit drugs.    No Known Allergies      FAMILY HISTORY  When questioned  Directly-patient reports  No family history of HTN, CVA ,DIABETES, TB, Cancer CAD, Bleeding Disorders, Sickle Cell, diabetes, anemia, asthma,   Prior to Admission medications   Medication Sig Start Date End Date Taking? Authorizing Provider  amLODipine (NORVASC) 10 MG tablet Take 10 mg by mouth daily.   Yes Historical Provider, MD  Fish Oil OIL Take by mouth every morning.    Yes Historical Provider, MD  glipiZIDE (GLUCOTROL) 10 MG tablet Take 10 mg by mouth 2 (two) times daily before a meal.   Yes Historical Provider, MD  Iron-FA-B Cmp-C-Biot-Probiotic (FUSION PLUS) CAPS Take by mouth every morning. ONLY TAKES WHEN SHE REMEMBERS   Yes Historical Provider, MD  losartan (COZAAR) 100 MG tablet Take 100 mg by mouth daily.   Yes Historical Provider,  MD  Multiple Vitamins-Minerals (CHOICEFUL MULTIVITAMIN) CAPS Take by mouth every morning.   Yes Historical Provider, MD     Physical Exam: Filed Vitals:   04/30/15 1242 04/30/15 1304 04/30/15 1320  04/30/15 1454  BP: 154/76  145/71 153/71  Pulse: 119 115 119 118  Temp: 98.4 F (36.9 C)   98.8 F (37.1 C)  TempSrc: Oral   Oral  Resp: '18 16 17 18  '$ SpO2: 97% 95% 95% 100%     Constitutional: Vital signs reviewed. Patient is a well-developed and well-nourished in no acute distress and cooperative with exam. Alert and oriented x3.  Head: Normocephalic and atraumatic  Ear: TM normal bilaterally  Mouth: no erythema or exudates, MMM  Eyes: PERRL, EOMI, conjunctivae normal, No scleral icterus.  Neck: Supple, Trachea midline normal ROM, No JVD, mass, thyromegaly, or carotid bruit present.  Cardiovascular: Tachycardic, S1 normal, S2 normal, no MRG, pulses symmetric and intact bilaterally  Pulmonary/Chest: CTAB, no wheezes, rales, or rhonchi  Abdominal: Soft. Non-tender, non-distended, bowel sounds are normal, no masses, organomegaly, or guarding present.  GU: no CVA tenderness Musculoskeletal: No joint deformities, erythema, or stiffness, ROM full and no nontender Ext: no edema and no cyanosis, pulses palpable bilaterally (DP and PT)  Hematology: no cervical, inginal, or axillary adenopathy.  Neurological: A&O x3, Strenght is normal and symmetric bilaterally, cranial nerve II-XII are grossly intact, no focal motor deficit, sensory intact to light touch bilaterally.  Skin: Warm, dry and intact. No rash, cyanosis, or clubbing.  Psychiatric: Normal mood and affect. speech and behavior is normal. Judgment and thought content normal. Cognition and memory are normal.      Data Review   Micro Results No results found for this or any previous visit (from the past 240 hour(s)).  Radiology Reports Ct Angio Chest Pe W/cm &/or Wo Cm  04/30/2015   CLINICAL DATA:  59 year old female with a history of shortness of breath and concern for pulmonary emboli.  EXAM: CT ANGIOGRAPHY CHEST WITH CONTRAST  TECHNIQUE: Multidetector CT imaging of the chest was performed using the standard protocol during bolus  administration of intravenous contrast. Multiplanar CT image reconstructions and MIPs were obtained to evaluate the vascular anatomy.  CONTRAST:  145m OMNIPAQUE IOHEXOL 350 MG/ML SOLN  COMPARISON:  Abdominal CT 04/30/2015, PET-CT 12/25/2013, chest CT 11/20/2013  FINDINGS: Chest:  Port catheter on the right chest wall via right IJ approach.  No axillary or supraclavicular adenopathy.  Nodular appearance of the thyroid with hypodense/ hypoenhancing small lesions inferiorly. Nonspecific by CT. Ill-defined low-density soft tissue inferior to the thyroid extending to the aortic arch was present on comparison PET-CT and chest CT, uncertain significance.  Enteric catheter within the esophagus, terminating out of the field of view.  Several mediastinal lymph nodes, none of which are enlarged by CT size criteria or have suspicious features.  Unremarkable appearance of the esophagus.  Unremarkable course caliber and contour of the thoracic aorta without dissection flap, aneurysm, periaortic fluid.  No central, lobar, segmental, or proximal subsegmental filling defects to indicate pulmonary emboli.  Heart size within normal limits without pericardial fluid/ thickening.  Compared to the prior PET-CT and chest CT of 2015, there has been interval development/enlargement of multiple bilateral pulmonary nodules.  The largest is in the right middle lobe measuring 14 mm (image 46). Remainder of the nodules are smaller than this diameter, and include right upper lobe (image 14, 7 mm), left upper lobe (image 30, 6 mm), right lower lobe (image 58, 11 mm) and  left lower lobe (image 38, 8 mm, image 40, 5 mm, image 60, 8 mm, image 63, 13 mm)  No confluent airspace disease.  No pneumothorax or pleural effusion.  Upper abdomen:  Hemangioma of the liver.  Additional findings of the abdomen are better characterized on dedicated contrast enhanced CT of today's date. Please refer to this report.  Musculoskeletal:  No displaced fracture  identified.  Focal sclerotic lesion at the angle of the right ninth rib, was present on the previous PET-CT.  Review of the MIP images confirms the above findings.  IMPRESSION: Study is negative for pulmonary emboli.  New/enlarging bilateral pulmonary nodules involving all lobes, compatible with metastatic disease. Largest in the right middle lobe measures 14 mm.  Sclerotic lesion at the angle of the right ninth rib, present on comparison CT of Dec 25, 2013, and could potentially be benign.  Signed,  Dulcy Fanny. Earleen Newport, DO  [facility]  [facility]   Electronically Signed   By: Corrie Mckusick D.O.   On: 04/30/2015 17:01   Ct Abdomen Pelvis W Contrast  04/30/2015   CLINICAL DATA:  Abdominal pain, back pain, nausea, vomiting. History of anal cancer. Last chemotherapy treatment in April.  EXAM: CT ABDOMEN AND PELVIS WITH CONTRAST  TECHNIQUE: Multidetector CT imaging of the abdomen and pelvis was performed using the standard protocol following bolus administration of intravenous contrast.  CONTRAST:  89m OMNIPAQUE IOHEXOL 300 MG/ML SOLN, 10102mOMNIPAQUE IOHEXOL 300 MG/ML SOLN  COMPARISON:  PET CT 12/25/2013  FINDINGS: Enlarging pulmonary nodules in the lung bases. Partially cavitary nodule in the right lower lobe measures 11 mm on image 9 compared with 3 mm previously. 11 mm nodule in the left lower lobe on image 8 previously measured 5 mm. Second enlarging left lower lobe nodule on image 9 in the posterior left lower lobe. These are most compatible with metastases. Heart is normal size. No pleural effusions.  Liver, gallbladder, spleen, pancreas, adrenals and kidneys are unremarkable.  Left upper quadrant ostomy noted. There are mildly prominent small bowel loop in s in the pelvis with air-fluid levels. Cannot completely exclude partial early low grade small bowel obstruction in the pelvis. No well-defined transition point.  Small amount of free fluid in the pelvis.  Urinary bladder is unremarkable. Prior hysterectomy. Aorta is normal caliber. No adenopathy or free air. No acute bony abnormality or focal bone lesion. Degenerative changes in the lumbar spine.  IMPRESSION: Postoperative changes with left abdominal ostomy small bowel obstruction or focal ileus.  Small amount of free fluid in the pelvis.  Enlarging bilateral lower lobe pulmonary nodules compatible with metastases.   Electronically Signed   By: KeRolm Baptise.D.   On: 04/30/2015 11:00     CBC  Recent Labs Lab 04/30/15 0940  WBC 12.4*  HGB 11.6*  HCT 35.6*  PLT 235  MCV 76.1*  MCH 24.8*  MCHC 32.6  RDW 17.2*  LYMPHSABS 1.0  MONOABS 0.6  EOSABS 0.0  BASOSABS 0.0    Chemistries   Recent Labs Lab 04/30/15 0940  NA 137  K 3.1*  CL 101  CO2 24  GLUCOSE 349*  BUN 15  CREATININE 0.72  CALCIUM 9.8  AST 24  ALT 22  ALKPHOS 83  BILITOT 0.6   ------------------------------------------------------------------------------------------------------------------ CrCl cannot be calculated (Unknown ideal weight.). ------------------------------------------------------------------------------------------------------------------ No results for input(s): HGBA1C in the last 72 hours. ------------------------------------------------------------------------------------------------------------------ No results for input(s): CHOL, HDL, LDLCALC, TRIG, CHOLHDL, LDLDIRECT in the last 72 hours. ------------------------------------------------------------------------------------------------------------------  No results for input(s): TSH, T4TOTAL, T3FREE, THYROIDAB in the last 72 hours.  Invalid input(s): FREET3 ------------------------------------------------------------------------------------------------------------------ No results for input(s): VITAMINB12, FOLATE, FERRITIN, TIBC, IRON, RETICCTPCT in the last 72 hours.  Coagulation profile No results for input(s): INR, PROTIME in the last 168  hours.  No results for input(s): DDIMER in the last 72 hours.  Cardiac Enzymes No results for input(s): CKMB, TROPONINI, MYOGLOBIN in the last 168 hours.  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 S/p 4 cycles of XELOX. Unfortunately, her CT imaging 7/11 concerning for metastatic disease with enlarging pulmonary nodules. She had a biopsy on 8/4 which revealed atypical cells but did not confirm malignancy. She was scheduled for her 2 nd  lung biopsy in the next few weeks as first one was non dx.  General surgery consulted for SBO, will keep NPO, NGT , no beds at [facility]    2) Neuropathy grade 2 - Stable    3) Right shoulder pain. Appears to be consistent with RTC injury. No bone lesions on CT imaging.    4) diabetes mellitus, patient will be started on sliding scale insulin, continue Accu-Cheks  5 ) hypertension-hold by mouth antihypertensive medications, patient has been started on  Prn Dilaudid    Code Status:   full Family Communication: bedside Disposition Plan: admit   Total time spent 55 minutes.Greater than 50% of this time was spent in counseling, explanation of diagnosis, planning of further management, and coordination of care  [facility] Pager [phone]  If 7PM-7AM, please contact night-coverage [email or web address] Password [email or web address] 04/30/2015, 5:18 PM

## 2015-04-30 NOTE — ED Notes (Signed)
abdomial pain acute onset this am

## 2015-04-30 NOTE — Progress Notes (Signed)
Pt will be transferred to [facility] for SBO. Surgery consulted by ED. Please notify them that the pt has arrived. Tele adm per ED MD recommendations.   Leisa Lenz [facility]  [phone]

## 2015-04-30 NOTE — ED Notes (Signed)
New onset of abdominal pain this am, states she ate a bunch of cake yesterday, has not checked blood sugar this am, states she has never had pain all over her abdomen like this, rectal ca in December, colostomy in dec

## 2015-04-30 NOTE — ED Notes (Signed)
Report given to chandler, rn at [place] long.

## 2015-04-30 NOTE — ED Notes (Signed)
carelink at bedside, report given to caleb,

## 2015-05-01 ENCOUNTER — Inpatient Hospital Stay (HOSPITAL_COMMUNITY): Payer: BC Managed Care – PPO

## 2015-05-01 DIAGNOSIS — R000 Tachycardia, unspecified: Secondary | ICD-10-CM

## 2015-05-01 DIAGNOSIS — E872 Acidosis, unspecified: Secondary | ICD-10-CM | POA: Diagnosis present

## 2015-05-01 DIAGNOSIS — C20 Malignant neoplasm of rectum: Secondary | ICD-10-CM

## 2015-05-01 DIAGNOSIS — I10 Essential (primary) hypertension: Secondary | ICD-10-CM | POA: Diagnosis present

## 2015-05-01 DIAGNOSIS — E1165 Type 2 diabetes mellitus with hyperglycemia: Secondary | ICD-10-CM | POA: Diagnosis present

## 2015-05-01 DIAGNOSIS — K5669 Other intestinal obstruction: Secondary | ICD-10-CM

## 2015-05-01 DIAGNOSIS — IMO0002 Reserved for concepts with insufficient information to code with codable children: Secondary | ICD-10-CM | POA: Diagnosis present

## 2015-05-01 DIAGNOSIS — E118 Type 2 diabetes mellitus with unspecified complications: Secondary | ICD-10-CM

## 2015-05-01 LAB — COMPREHENSIVE METABOLIC PANEL
ALK PHOS: 67 U/L (ref 38–126)
ALT: 21 U/L (ref 14–54)
ANION GAP: 10 (ref 5–15)
AST: 20 U/L (ref 15–41)
Albumin: 3.6 g/dL (ref 3.5–5.0)
BILIRUBIN TOTAL: 1.1 mg/dL (ref 0.3–1.2)
BUN: 13 mg/dL (ref 6–20)
CALCIUM: 9.3 mg/dL (ref 8.9–10.3)
CO2: 27 mmol/L (ref 22–32)
CREATININE: 0.65 mg/dL (ref 0.44–1.00)
Chloride: 103 mmol/L (ref 101–111)
GFR calc non Af Amer: >60 mL/min (ref >60)
Glucose, Bld: 318 mg/dL — ABNORMAL HIGH (ref 65–99)
Potassium: 3.3 mmol/L — ABNORMAL LOW (ref 3.5–5.1)
SODIUM: 140 mmol/L (ref 135–145)
TOTAL PROTEIN: 6.8 g/dL (ref 6.5–8.1)

## 2015-05-01 LAB — CBC
HCT: 39.2 % (ref 36.0–46.0)
HEMOGLOBIN: 12.5 g/dL (ref 12.0–15.0)
MCH: 24.6 pg — AB (ref 26.0–34.0)
MCHC: 31.9 g/dL (ref 30.0–36.0)
MCV: 77.2 fL — ABNORMAL LOW (ref 78.0–100.0)
PLATELETS: 246 10*3/uL (ref 150–400)
RBC: 5.08 MIL/uL (ref 3.87–5.11)
RDW: 17.6 % — ABNORMAL HIGH (ref 11.5–15.5)
WBC: 14.1 10*3/uL — ABNORMAL HIGH (ref 4.0–10.5)

## 2015-05-01 LAB — TROPONIN I
Troponin I: <0.03 ng/mL (ref <0.031)
Troponin I: <0.03 ng/mL (ref <0.031)

## 2015-05-01 LAB — HEMOGLOBIN A1C
Hgb A1c MFr Bld: 7.7 % — ABNORMAL HIGH (ref 4.8–5.6)
Mean Plasma Glucose: 174 mg/dL

## 2015-05-01 LAB — GLUCOSE, CAPILLARY
GLUCOSE-CAPILLARY: 290 mg/dL — AB (ref 65–99)
GLUCOSE-CAPILLARY: 297 mg/dL — AB (ref 65–99)
GLUCOSE-CAPILLARY: 212 mg/dL — AB (ref 65–99)
GLUCOSE-CAPILLARY: 193 mg/dL — AB (ref 65–99)

## 2015-05-01 LAB — BRAIN NATRIURETIC PEPTIDE: B Natriuretic Peptide: 129.5 pg/mL — ABNORMAL HIGH (ref 0.0–100.0)

## 2015-05-01 LAB — LACTIC ACID, PLASMA: Lactic Acid, Venous: 1.6 mmol/L (ref 0.5–2.0)

## 2015-05-01 MED ORDER — METOPROLOL TARTRATE 1 MG/ML IV SOLN
5.0000 mg | Freq: Four times a day (QID) | INTRAVENOUS | Status: DC
  Administered 2015-05-01 – 2015-05-05 (×19): 5 mg via INTRAVENOUS
  Filled 2015-05-01 (×23): qty 5

## 2015-05-01 MED ORDER — DIATRIZOATE MEGLUMINE & SODIUM 66-10 % PO SOLN
90.0000 mL | Freq: Once | ORAL | Status: AC
  Administered 2015-05-01: 90 mL via NASOGASTRIC
  Filled 2015-05-01: qty 90

## 2015-05-01 MED ORDER — INSULIN ASPART 100 UNIT/ML ~~LOC~~ SOLN
0.0000 [IU] | SUBCUTANEOUS | Status: DC
  Administered 2015-05-01: 8 [IU] via SUBCUTANEOUS
  Administered 2015-05-01: 5 [IU] via SUBCUTANEOUS
  Administered 2015-05-01: 3 [IU] via SUBCUTANEOUS
  Administered 2015-05-01: 8 [IU] via SUBCUTANEOUS
  Administered 2015-05-02 (×2): 3 [IU] via SUBCUTANEOUS
  Administered 2015-05-02: 5 [IU] via SUBCUTANEOUS
  Administered 2015-05-02 (×3): 3 [IU] via SUBCUTANEOUS
  Administered 2015-05-03: 2 [IU] via SUBCUTANEOUS
  Administered 2015-05-03: 3 [IU] via SUBCUTANEOUS
  Administered 2015-05-04 – 2015-05-05 (×6): 2 [IU] via SUBCUTANEOUS

## 2015-05-01 MED ORDER — INSULIN GLARGINE 100 UNIT/ML ~~LOC~~ SOLN
10.0000 [IU] | Freq: Every day | SUBCUTANEOUS | Status: DC
  Administered 2015-05-01 – 2015-05-03 (×3): 10 [IU] via SUBCUTANEOUS
  Filled 2015-05-01 (×3): qty 0.1

## 2015-05-01 NOTE — Progress Notes (Signed)
Inpatient Diabetes Program Recommendations  AACE/ADA: New Consensus Statement on Inpatient Glycemic Control (2015)  Target Ranges:  Prepandial:   less than 140 mg/dL      Peak postprandial:   less than 180 mg/dL (1-2 hours)      Critically ill patients:  140 - 180 mg/dL   Review of Glycemic Control  Results for NAKETA, DADDARIO (MRN [id number]) as of 05/01/2015 15:21  Ref. Range 04/30/2015 09:08 04/30/2015 13:26 04/30/2015 17:27 04/30/2015 21:49 05/01/2015 07:43 05/01/2015 12:01  Glucose-Capillary Latest Ref Range: 65-99 mg/dL 290 (H) 283 (H) 247 (H) 224 (H) 290 (H) 297 (H)    Inpatient Diabetes Program Recommendations:  Insulin - Basal: Add Lantus 10 units QHS   Will follow. Thank you. Lorenda Peck, RD, LDN, CDE Inpatient Diabetes Coordinator [phone]

## 2015-05-01 NOTE — Progress Notes (Signed)
EKG done and on chart, showing sinus tachycardia.

## 2015-05-01 NOTE — Progress Notes (Signed)
PROGRESS NOTE  Barbara Matthews RJJ:[id number] DOB: Jun 04, 1956 DOA: 04/30/2015 PCP: Anselmo Pickler, DO  HPI/Recap of past 50 hours: 59 year old female past oral history of adenocarcinoma of the rectum status post permanent colostomy in 2015 admitted on 9/21 after having several hours of abdominal pain and found to have a colonic obstruction. General surgery consulted and patient started on IV fluids, pain medication and NG tube. Lactic acid level elevated at 3.2  The following day, patient CBGs much higher. White count staying elevated at 14.  Patient herself quite somnolent, still complains of abdominal pain  Assessment/Plan: Active Problems:   SBO (small bowel obstruction): Given persistent white count and tachycardia, concerned about bowel ischemia. Have ordered repeat lactic acid level. Continue IV fluids.  If lactic acid level worse, we'll notify surgery and start IV antibiotics   Essential hypertension   Diabetes mellitus type 2, uncontrolled, with complications: Given elevated blood sugars, likely setting of stress margination, have added Lantus   Tachycardia, secondary to bowel obstruction History of rectal cancer: CT imaging done in July concerning for metastatic disease with enlarging pulmonary nodules. Second lung biopsy scheduled in a few weeks. Currently on chemotherapy along with surgical colostomy  Code Status: Full code  Family Communication: Left message for family  Disposition Plan: We'll continue to be inpatient for several more days   Consultants:  General surgery  Procedures:  None  Antibiotics:  None   Objective: BP 129/70 mmHg  Pulse 150  Temp(Src) 99.1 F (37.3 C) (Oral)  Resp 18  SpO2 96%  Intake/Output Summary (Last 24 hours) at 05/01/15 1826 Last data filed at 05/01/15 1733  Gross per 24 hour  Intake      0 ml  Output   2300 ml  Net  -2300 ml   There were no vitals filed for this visit.  Exam:   General:  Alert and oriented 3,  somnolent  Cardiovascular: Regular rhythm, tachycardic  Respiratory: Clear to auscultation bilaterally  Abdomen: Soft, no bowel sounds, nondistended  Musculoskeletal: No edema   Data Reviewed: Basic Metabolic Panel:  Recent Labs Lab 04/30/15 0940 04/30/15 1735 05/01/15 0654  NA 137  --  140  K 3.1*  --  3.3*  CL 101  --  103  CO2 24  --  27  GLUCOSE 349*  --  318*  BUN 15  --  13  CREATININE 0.72 0.59 0.65  CALCIUM 9.8  --  9.3  MG  --  1.2*  --    Liver Function Tests:  Recent Labs Lab 04/30/15 0940 05/01/15 0654  AST 24 20  ALT 22 21  ALKPHOS 83 67  BILITOT 0.6 1.1  PROT 7.5 6.8  ALBUMIN 4.0 3.6    Recent Labs Lab 04/30/15 0940  LIPASE 56*   No results for input(s): AMMONIA in the last 168 hours. CBC:  Recent Labs Lab 04/30/15 0940 04/30/15 1735 05/01/15 0654  WBC 12.4* 13.0* 14.1*  NEUTROABS 10.8*  --   --   HGB 11.6* 11.5* 12.5  HCT 35.6* 35.5* 39.2  MCV 76.1* 77.2* 77.2*  PLT 235 244 246   Cardiac Enzymes:    Recent Labs Lab 04/30/15 1735 05/01/15 0044 05/01/15 0654  TROPONINI <0.03 <0.03 <0.03   BNP (last 3 results)  Recent Labs  05/01/15 0654  BNP 129.5*    ProBNP (last 3 results) No results for input(s): PROBNP in the last 8760 hours.  CBG:  Recent Labs Lab 04/30/15 1727 04/30/15 2149 05/01/15 0743 05/01/15 1201  05/01/15 1700  GLUCAP 247* 224* 290* 297* 212*    No results found for this or any previous visit (from the past 240 hour(s)).   Studies: Dg Chest Port 1 View  04/30/2015   CLINICAL DATA:  Port-A-Cath placement, hypertension, diabetes mellitus, anal adenocarcinoma  EXAM: PORTABLE CHEST - 1 VIEW  COMPARISON:  Portable exam 1846 hr compared to CT chest of 04/30/2015  FINDINGS: RIGHT jugular dual-lumen Port-A-Cath with tip projecting over SVC.  Nasogastric tube extends into stomach.  Enlargement of cardiac silhouette.  Mediastinal contours and pulmonary vascularity normal.  Scattered pulmonary nodules again  identified.  No gross infiltrate, pleural effusion or pneumothorax.  No acute osseous findings.  IMPRESSION: No pneumothorax following Port-A-Cath placement.  Enlargement of cardiac silhouette.  Pulmonary nodules question metastatic disease   Electronically Signed   By: Lavonia Dana M.D.   On: 04/30/2015 19:36   Dg Abd Portable 1v  05/01/2015   CLINICAL DATA:  Followup slow  EXAM: PORTABLE ABDOMEN - 1 VIEW  COMPARISON:  04/30/2015  FINDINGS: Left upper quadrant ostomy is again identified. Scattered large and small bowel gas is noted. No definitive obstructive changes are seen although the degree of fluid distension seen previously may preclude adequate evaluation on plain film. Contrast material is noted within the gallbladder no free air is seen. No abnormal mass is noted. A nasogastric catheter is seen within the stomach.  IMPRESSION: No definitive obstructive changes are noted although the degree of dilated fluid-filled bowel would not be well appreciated on this exam.   Electronically Signed   By: Inez Catalina M.D.   On: 05/01/2015 08:29    Scheduled Meds: . antiseptic oral rinse  7 mL Mouth Rinse q12n4p  . chlorhexidine  15 mL Mouth Rinse BID  . enoxaparin (LOVENOX) injection  40 mg Subcutaneous Q24H  . insulin aspart  0-15 Units Subcutaneous 6 times per day  . insulin glargine  10 Units Subcutaneous QHS  . metoprolol  5 mg Intravenous 4 times per day  . sodium chloride  3 mL Intravenous Q12H    Continuous Infusions: . sodium chloride 50 mL/hr at 05/01/15 0932     Time spent: 35 minutes  [facility] Pager [phone]. If 7PM-7AM, please contact night-coverage at [email or web address], password [facility] 05/01/2015, 6:26 PM  LOS: 1 day

## 2015-05-01 NOTE — Progress Notes (Signed)
[facility] Progress Note     Subjective: Pt doing okay.  Abdominal pain/distension N/V improved some.  Not been OOB yet.  No flatus or BM through ostomy yet.  Objective: Vital signs in last 24 hours: Temp:  [98.4 F (36.9 C)-99.2 F (37.3 C)] 99.2 F (37.3 C) (09/22 0523) Pulse Rate:  [112-144] 144 (09/22 0523) Resp:  [16-22] 18 (09/22 0523) BP: (145-159)/(69-79) 147/69 mmHg (09/22 0523) SpO2:  [95 %-100 %] 95 % (09/22 0523) Last BM Date: 04/30/15  Intake/Output from previous day: 09/21 0701 - 09/22 0700 In: 1000 [I.V.:1000] Out: 2000 [Urine:1100; Emesis/NG output:900] Intake/Output this shift:    PE: Gen:  Alert, NAD, pleasant Abd: Soft, distension, minimal pain, diminished BS, no HSM, abdominal scars noted, ostomy in Left abdomen with no flatus or BM   Lab Results:   Recent Labs  04/30/15 1735 05/01/15 0654  WBC 13.0* 14.1*  HGB 11.5* 12.5  HCT 35.5* 39.2  PLT 244 246   BMET  Recent Labs  04/30/15 0940 04/30/15 1735  NA 137  --   K 3.1*  --   CL 101  --   CO2 24  --   GLUCOSE 349*  --   BUN 15  --   CREATININE 0.72 0.59  CALCIUM 9.8  --    PT/INR No results for input(s): LABPROT, INR in the last 72 hours. CMP     Component Value Date/Time   NA 137 04/30/2015 0940   K 3.1* 04/30/2015 0940   CL 101 04/30/2015 0940   CO2 24 04/30/2015 0940   GLUCOSE 349* 04/30/2015 0940   BUN 15 04/30/2015 0940   CREATININE 0.59 04/30/2015 1735   CALCIUM 9.8 04/30/2015 0940   PROT 7.5 04/30/2015 0940   ALBUMIN 4.0 04/30/2015 0940   AST 24 04/30/2015 0940   ALT 22 04/30/2015 0940   ALKPHOS 83 04/30/2015 0940   BILITOT 0.6 04/30/2015 0940   GFRNONAA >60 04/30/2015 1735   GFRAA >60 04/30/2015 1735   Lipase     Component Value Date/Time   LIPASE 56* 04/30/2015 0940       Studies/Results: Ct Angio Chest Pe W/cm &/or Wo Cm  04/30/2015   CLINICAL DATA:  59 year old female with a history of shortness of breath and concern for pulmonary  emboli.  EXAM: CT ANGIOGRAPHY CHEST WITH CONTRAST  TECHNIQUE: Multidetector CT imaging of the chest was performed using the standard protocol during bolus administration of intravenous contrast. Multiplanar CT image reconstructions and MIPs were obtained to evaluate the vascular anatomy.  CONTRAST:  122m OMNIPAQUE IOHEXOL 350 MG/ML SOLN  COMPARISON:  Abdominal CT 04/30/2015, PET-CT 12/25/2013, chest CT 11/20/2013  FINDINGS: Chest:  Port catheter on the right chest wall via right IJ approach.  No axillary or supraclavicular adenopathy.  Nodular appearance of the thyroid with hypodense/ hypoenhancing small lesions inferiorly. Nonspecific by CT. Ill-defined low-density soft tissue inferior to the thyroid extending to the aortic arch was present on comparison PET-CT and chest CT, uncertain significance.  Enteric catheter within the esophagus, terminating out of the field of view.  Several mediastinal lymph nodes, none of which are enlarged by CT size criteria or have suspicious features.  Unremarkable appearance of the esophagus.  Unremarkable course caliber and contour of the thoracic aorta without dissection flap, aneurysm, periaortic fluid.  No central, lobar, segmental, or proximal subsegmental filling defects to indicate pulmonary emboli.  Heart size within normal limits without pericardial fluid/ thickening.  Compared to the prior PET-CT and chest CT  of 2015, there has been interval development/enlargement of multiple bilateral pulmonary nodules.  The largest is in the right middle lobe measuring 14 mm (image 46). Remainder of the nodules are smaller than this diameter, and include right upper lobe (image 14, 7 mm), left upper lobe (image 30, 6 mm), right lower lobe (image 58, 11 mm) and left lower lobe (image 38, 8 mm, image 40, 5 mm, image 60, 8 mm, image 63, 13 mm)  No confluent airspace disease.  No pneumothorax or pleural effusion.  Upper abdomen:  Hemangioma of the liver.  Additional findings of the abdomen  are better characterized on dedicated contrast enhanced CT of today's date. Please refer to this report.  Musculoskeletal:  No displaced fracture identified.  Focal sclerotic lesion at the angle of the right ninth rib, was present on the previous PET-CT.  Review of the MIP images confirms the above findings.  IMPRESSION: Study is negative for pulmonary emboli.  New/enlarging bilateral pulmonary nodules involving all lobes, compatible with metastatic disease. Largest in the right middle lobe measures 14 mm.  Sclerotic lesion at the angle of the right ninth rib, present on comparison CT of Dec 25, 2013, and could potentially be benign.  Signed,  Dulcy Fanny. Earleen Newport, DO  [facility]  [facility]   Electronically Signed   By: Corrie Mckusick D.O.   On: 04/30/2015 17:01   Ct Abdomen Pelvis W Contrast  04/30/2015   CLINICAL DATA:  Abdominal pain, back pain, nausea, vomiting. History of anal cancer. Last chemotherapy treatment in April.  EXAM: CT ABDOMEN AND PELVIS WITH CONTRAST  TECHNIQUE: Multidetector CT imaging of the abdomen and pelvis was performed using the standard protocol following bolus administration of intravenous contrast.  CONTRAST:  85m OMNIPAQUE IOHEXOL 300 MG/ML SOLN, 1070mOMNIPAQUE IOHEXOL 300 MG/ML SOLN  COMPARISON:  PET CT 12/25/2013  FINDINGS: Enlarging pulmonary nodules in the lung bases. Partially cavitary nodule in the right lower lobe measures 11 mm on image 9 compared with 3 mm previously. 11 mm nodule in the left lower lobe on image 8 previously measured 5 mm. Second enlarging left lower lobe nodule on image 9 in the posterior left lower lobe. These are most compatible with metastases. Heart is normal size. No pleural effusions.  Liver, gallbladder, spleen, pancreas, adrenals and kidneys are unremarkable.  Left upper quadrant ostomy noted. There are mildly prominent small bowel loop in s in the pelvis with air-fluid levels. Cannot completely exclude  partial early low grade small bowel obstruction in the pelvis. No well-defined transition point.  Small amount of free fluid in the pelvis. Urinary bladder is unremarkable. Prior hysterectomy. Aorta is normal caliber. No adenopathy or free air. No acute bony abnormality or focal bone lesion. Degenerative changes in the lumbar spine.  IMPRESSION: Postoperative changes with left abdominal ostomy small bowel obstruction or focal ileus.  Small amount of free fluid in the pelvis.  Enlarging bilateral lower lobe pulmonary nodules compatible with metastases.   Electronically Signed   By: KeRolm Baptise.D.   On: 04/30/2015 11:00   Dg Chest Port 1 View  04/30/2015   CLINICAL DATA:  Port-A-Cath placement, hypertension, diabetes mellitus, anal adenocarcinoma  EXAM: PORTABLE CHEST - 1 VIEW  COMPARISON:  Portable exam 1846 hr compared to CT chest of 04/30/2015  FINDINGS: RIGHT jugular dual-lumen Port-A-Cath with tip projecting over SVC.  Nasogastric tube extends into stomach.  Enlargement of cardiac silhouette.  Mediastinal contours and pulmonary vascularity normal.  Scattered pulmonary  nodules again identified.  No gross infiltrate, pleural effusion or pneumothorax.  No acute osseous findings.  IMPRESSION: No pneumothorax following Port-A-Cath placement.  Enlargement of cardiac silhouette.  Pulmonary nodules question metastatic disease   Electronically Signed   By: Lavonia Dana M.D.   On: 04/30/2015 19:36    Anti-infectives: Anti-infectives    None       Assessment/Plan PSBO POD #2 -IVF, bowel rest, antiemetics, NG tube -Ambulate and IS -SCD's and lovenox -WBC up a bit today -NG tube with 932m dark brown drainage -Initiate SB protocol with gastrografin.    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

## 2015-05-02 DIAGNOSIS — K56609 Unspecified intestinal obstruction, unspecified as to partial versus complete obstruction: Secondary | ICD-10-CM | POA: Diagnosis present

## 2015-05-02 LAB — CBC
HEMATOCRIT: 40.3 % (ref 36.0–46.0)
HEMOGLOBIN: 12.9 g/dL (ref 12.0–15.0)
MCH: 25 pg — AB (ref 26.0–34.0)
MCHC: 32 g/dL (ref 30.0–36.0)
MCV: 78.3 fL (ref 78.0–100.0)
PLATELETS: 281 10*3/uL (ref 150–400)
RBC: 5.15 MIL/uL — AB (ref 3.87–5.11)
RDW: 17.6 % — ABNORMAL HIGH (ref 11.5–15.5)
WBC: 16.8 10*3/uL — AB (ref 4.0–10.5)

## 2015-05-02 LAB — BASIC METABOLIC PANEL
ANION GAP: 8 (ref 5–15)
BUN: 37 mg/dL — AB (ref 6–20)
CHLORIDE: 104 mmol/L (ref 101–111)
CO2: 32 mmol/L (ref 22–32)
Calcium: 9 mg/dL (ref 8.9–10.3)
Creatinine, Ser: 1.77 mg/dL — ABNORMAL HIGH (ref 0.44–1.00)
GFR calc Af Amer: 35 mL/min — ABNORMAL LOW (ref >60)
GFR, EST NON AFRICAN AMERICAN: 30 mL/min — AB (ref >60)
GLUCOSE: 176 mg/dL — AB (ref 65–99)
POTASSIUM: 3.5 mmol/L (ref 3.5–5.1)
Sodium: 144 mmol/L (ref 135–145)

## 2015-05-02 LAB — GLUCOSE, CAPILLARY
GLUCOSE-CAPILLARY: 176 mg/dL — AB (ref 65–99)
GLUCOSE-CAPILLARY: 175 mg/dL — AB (ref 65–99)
Glucose-Capillary: 188 mg/dL — ABNORMAL HIGH (ref 65–99)
Glucose-Capillary: 220 mg/dL — ABNORMAL HIGH (ref 65–99)
Glucose-Capillary: 183 mg/dL — ABNORMAL HIGH (ref 65–99)
Glucose-Capillary: 171 mg/dL — ABNORMAL HIGH (ref 65–99)

## 2015-05-02 MED ORDER — PIPERACILLIN-TAZOBACTAM 3.375 G IVPB
3.3750 g | Freq: Three times a day (TID) | INTRAVENOUS | Status: DC
  Administered 2015-05-02 – 2015-05-05 (×10): 3.375 g via INTRAVENOUS
  Filled 2015-05-02 (×12): qty 50

## 2015-05-02 MED ORDER — SODIUM CHLORIDE 0.9 % IV BOLUS (SEPSIS)
250.0000 mL | Freq: Once | INTRAVENOUS | Status: AC
  Administered 2015-05-02: 250 mL via INTRAVENOUS

## 2015-05-02 NOTE — Progress Notes (Signed)
  Progress Note: General Surgery Service   Subjective: Sore in abdomen, no nausea or vomiting, +ostomy output overnight  Objective: Vital signs in last 24 hours: Temp:  [98.3 F (36.8 C)-99.1 F (37.3 C)] 98.3 F (36.8 C) (09/23 0500) Pulse Rate:  [122-150] 122 (09/23 0500) Resp:  [24] 24 (09/23 0500) BP: (88-131)/(43-70) 100/60 mmHg (09/23 0500) SpO2:  [96 %-98 %] 96 % (09/23 0500) Last BM Date: 05/01/15  Intake/Output from previous day: 09/22 0701 - 09/23 0700 In: 3570.8 [I.V.:3570.8] Out: 1500 [Urine:300; Emesis/NG output:1200] Intake/Output this shift:    Lungs: CTAB  Cardiovascular: tachycardic  Abd: soft, NT, ND ostomy output in pbag  Extremities: no edema  Neuro: AOx4  Lab Results: CBC   Recent Labs  05/01/15 0654 05/02/15 0440  WBC 14.1* 16.8*  HGB 12.5 12.9  HCT 39.2 40.3  PLT 246 281   BMET  Recent Labs  05/01/15 0654 05/02/15 0440  NA 140 144  K 3.3* 3.5  CL 103 104  CO2 27 32  GLUCOSE 318* 176*  BUN 13 37*  CREATININE 0.65 1.77*  CALCIUM 9.3 9.0   PT/INR No results for input(s): LABPROT, INR in the last 72 hours. ABG No results for input(s): PHART, HCO3 in the last 72 hours.  Invalid input(s): PCO2, PO2  Studies/Results:  Anti-infectives: Anti-infectives    None      Medications: Scheduled Meds: . antiseptic oral rinse  7 mL Mouth Rinse q12n4p  . chlorhexidine  15 mL Mouth Rinse BID  . enoxaparin (LOVENOX) injection  40 mg Subcutaneous Q24H  . insulin aspart  0-15 Units Subcutaneous 6 times per day  . insulin glargine  10 Units Subcutaneous QHS  . metoprolol  5 mg Intravenous 4 times per day  . sodium chloride  3 mL Intravenous Q12H   Continuous Infusions: . sodium chloride 125 mL/hr at 05/01/15 1852   PRN Meds:.acetaminophen **OR** acetaminophen, hydrALAZINE, HYDROmorphone (DILAUDID) injection, levalbuterol, metoCLOPramide (REGLAN) injection, ondansetron **OR** ondansetron (ZOFRAN) IV, sodium  chloride  Assessment/Plan: Patient Active Problem List   Diagnosis Date Noted  . Essential hypertension 05/01/2015  . Diabetes mellitus type 2, uncontrolled, with complications 21/30/8657  . Tachycardia 05/01/2015  . Lactic acidosis 05/01/2015  . SBO (small bowel obstruction) 04/30/2015  . Anal cancer-adenocarcinoma 02/05/2014  . Mass of anus 10/10/2013   SBO now resolved -clamp NG -clears -OOB   LOS: 2 days   Mickeal Skinner, MD Pg# [phone] [facility]

## 2015-05-02 NOTE — Progress Notes (Signed)
ANTIBIOTIC CONSULT NOTE - INITIAL  Pharmacy Consult for Zosyn Indication: Intra-abdominal infection  No Known Allergies  Patient Measurements: Height: '5\' 6"'$  (167.6 cm) Weight: 163 lb 5.8 oz (74.1 kg) IBW/kg (Calculated) : 59.3   Vital Signs: Temp: 98.3 F (36.8 C) (09/23 0500) Temp Source: Oral (09/23 0500) BP: 137/66 mmHg (09/23 0857) Pulse Rate: 149 (09/23 0857) Intake/Output from previous day: 09/22 0701 - 09/23 0700 In: 3570.8 [I.V.:3570.8] Out: 1500 [Urine:300; Emesis/NG output:1200] Intake/Output from this shift:    Labs:  Recent Labs  04/30/15 1735 05/01/15 0654 05/02/15 0440  WBC 13.0* 14.1* 16.8*  HGB 11.5* 12.5 12.9  PLT 244 246 281  CREATININE 0.59 0.65 1.77*   Estimated Creatinine Clearance: 34.8 mL/min (by C-G formula based on Cr of 1.77). No results for input(s): VANCOTROUGH, VANCOPEAK, VANCORANDOM, GENTTROUGH, GENTPEAK, GENTRANDOM, TOBRATROUGH, TOBRAPEAK, TOBRARND, AMIKACINPEAK, AMIKACINTROU, AMIKACIN in the last 72 hours.   Microbiology: No results found for this or any previous visit (from the past 720 hour(s)).  Medical History: Past Medical History  Diagnosis Date  . Hypertension   . Diabetes mellitus without complication   . Elevated cholesterol   . Hemorrhoids   . Anxiety   . Cancer   . Status post colostomy     Assessment: 30 y/oF with PMH of adenocarcinoma of rectum s/p permanent colostomy who was admitted after having several hours of abdominal pain and found to have a colonic obstruction. Given elevated white count and persistent tachycardia, concern about bowel ischemia. Pharmacy consulted to dose Zosyn for possible intra-abdominal infection.   9/23 >> Zosyn >>    No cultures drawn Tmax: 99.73F WBC: elevated and trending up, 12.4 > 13 > 14.1 > 16.8 Renal: AKI, SCr up to 1.77 with CrCl ~ 35 ml/min CG  Goal of Therapy:  Appropriate antibiotic dosing for renal function and indication Eradication of infection  Plan:  Zosyn  3.375g IV q8h (infuse over 4 hours) Monitor renal function, culture results as available, clinical course.   Lindell Spar, PharmD, BCPS Pager: [phone] 05/02/2015 12:42 PM

## 2015-05-02 NOTE — Progress Notes (Signed)
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 General surgery consulted and patient started on IV fluids, pain medication and NG tube. Lactic acid level elevated at 3.2  The following day, patient CBGs much higher. White count staying elevated at 14.  Patient herself quite somnolent, still complains of abdominal pain  Assessment/Plan:     SBO (small bowel obstruction): Given persistent white count and tachycardia, concerned about bowel ischemia. Lactic acid 1.6. Continue IV fluids. White count is trending up therefore start the patient on empiric antibiotics. NG tube aspirate appears to be bloody, therefore start patient on IV PPI, advance diet to clears, removed NG tube as soon as possible   Acute kidney injury-, prerenal,/contrast nephropathy, creatinine has increased from 0.65-1.77-increased IV fluids to 150 mL/h    Essential hypertension    Diabetes mellitus type 2, uncontrolled, with complications: Given elevated blood sugars, likely setting of stress margination, have added Lantus    Tachycardia, secondary to bowel obstruction, ruled out for pulmonary embolism  History of rectal cancer: CT imaging done in July concerning for metastatic disease with enlarging pulmonary nodules. Second lung biopsy scheduled this week. Currently on chemotherapy along with surgical colostomy  Code Status: Full code  Family Communication: Left message for family  Disposition Plan: Continue inpatient,   Consultants:  General surgery  Procedures:  None  Antibiotics:  None   Objective: BP 137/66 mmHg  Pulse 149  Temp(Src) 98.3 F (36.8 C) (Oral)  Resp 24  SpO2 96%  Intake/Output Summary  (Last 24 hours) at 05/02/15 1154 Last data filed at 05/02/15 0500  Gross per 24 hour  Intake 3570.83 ml  Output   1500 ml  Net 2070.83 ml   There were no vitals filed for this visit.  Exam:   General:  Alert and oriented 3, somnolent  Cardiovascular: Regular rhythm, tachycardic  Respiratory: Clear to auscultation bilaterally  Abdomen: Soft, no bowel sounds, nondistended  Musculoskeletal: No edema   Data Reviewed: Basic Metabolic Panel:  Recent Labs Lab 04/30/15 0940 04/30/15 1735 05/01/15 0654 05/02/15 0440  NA 137  --  140 144  K 3.1*  --  3.3* 3.5  CL 101  --  103 104  CO2 24  --  27 32  GLUCOSE 349*  --  318* 176*  BUN 15  --  13 37*  CREATININE 0.72 0.59 0.65 1.77*  CALCIUM 9.8  --  9.3 9.0  MG  --  1.2*  --   --    Liver Function Tests:  Recent Labs Lab 04/30/15 0940 05/01/15 0654  AST 24 20  ALT 22 21  ALKPHOS 83 67  BILITOT 0.6 1.1  PROT 7.5 6.8  ALBUMIN 4.0 3.6    Recent Labs Lab 04/30/15 0940  LIPASE 56*   No results for input(s): AMMONIA in the last 168 hours. CBC:  Recent Labs Lab 04/30/15 0940 04/30/15 1735 05/01/15 0654 05/02/15 0440  WBC 12.4* 13.0* 14.1* 16.8*  NEUTROABS 10.8*  --   --   --   HGB 11.6* 11.5* 12.5 12.9  HCT 35.6* 35.5* 39.2 40.3  MCV 76.1* 77.2* 77.2* 78.3  PLT 235 244 246 281   Cardiac Enzymes:    Recent Labs Lab 04/30/15 1735 05/01/15  0044 05/01/15 0654  TROPONINI <0.03 <0.03 <0.03   BNP (last 3 results)  Recent Labs  05/01/15 0654  BNP 129.5*    ProBNP (last 3 results) No results for input(s): PROBNP in the last 8760 hours.  CBG:  Recent Labs Lab 05/01/15 1700 05/01/15 2121 05/02/15 0146 05/02/15 0408 05/02/15 0809  GLUCAP 212* 193* 176* 175* 188*    No results found for this or any previous visit (from the past 240 hour(s)).   Studies: Dg Abd Portable 1v-small Bowel Obstruction Protocol-initial, 8 Hr Delay  05/01/2015   CLINICAL DATA:  Small-bowel protocol.  Contrast  given at 11 a.m.  EXAM: PORTABLE ABDOMEN - 1 VIEW  COMPARISON:  05/01/2015 at 7:58 a.m.  FINDINGS: On the previous exam, there is a focus of tubular contrast that project in the right mid abdomen. This is stable.  There is some contrast in the stomach. Some contrast is seen superimposed on the left colostomy. There is no other contrast evident within the abdomen. It is unclear whether this contrast has passed through the bowel and in removed from a colostomy bag. There is no visible small bowel contrast to suggest significant obstruction. No small bowel dilation is seen.  IMPRESSION: No convincing small bowel obstruction. No small bowel contrast is seen. There is some contrast the left mid abdomen colostomy.   Electronically Signed   By: Lajean Manes M.D.   On: 05/01/2015 19:24    Scheduled Meds: . antiseptic oral rinse  7 mL Mouth Rinse q12n4p  . chlorhexidine  15 mL Mouth Rinse BID  . enoxaparin (LOVENOX) injection  40 mg Subcutaneous Q24H  . insulin aspart  0-15 Units Subcutaneous 6 times per day  . insulin glargine  10 Units Subcutaneous QHS  . metoprolol  5 mg Intravenous 4 times per day  . sodium chloride  3 mL Intravenous Q12H    Continuous Infusions: . sodium chloride 150 mL/hr at 05/02/15 0900     Time spent: 35 minutes  [facility]  [facility] Pager [phone] If 7PM-7AM, please contact night-coverage at [email or web address], password [facility] 05/02/2015, 11:54 AM  LOS: 2 days

## 2015-05-03 ENCOUNTER — Inpatient Hospital Stay (HOSPITAL_COMMUNITY): Payer: BC Managed Care – PPO

## 2015-05-03 LAB — GLUCOSE, CAPILLARY
GLUCOSE-CAPILLARY: 102 mg/dL — AB (ref 65–99)
GLUCOSE-CAPILLARY: 101 mg/dL — AB (ref 65–99)
GLUCOSE-CAPILLARY: 162 mg/dL — AB (ref 65–99)
Glucose-Capillary: 141 mg/dL — ABNORMAL HIGH (ref 65–99)
Glucose-Capillary: 93 mg/dL (ref 65–99)
Glucose-Capillary: 97 mg/dL (ref 65–99)

## 2015-05-03 LAB — CBC
HCT: 34.5 % — ABNORMAL LOW (ref 36.0–46.0)
Hemoglobin: 11 g/dL — ABNORMAL LOW (ref 12.0–15.0)
MCH: 24.9 pg — AB (ref 26.0–34.0)
MCHC: 31.9 g/dL (ref 30.0–36.0)
MCV: 78.2 fL (ref 78.0–100.0)
PLATELETS: 257 10*3/uL (ref 150–400)
RBC: 4.41 MIL/uL (ref 3.87–5.11)
RDW: 17.4 % — AB (ref 11.5–15.5)
WBC: 8.1 10*3/uL (ref 4.0–10.5)

## 2015-05-03 LAB — COMPREHENSIVE METABOLIC PANEL
ALK PHOS: 48 U/L (ref 38–126)
ALT: 18 U/L (ref 14–54)
AST: 23 U/L (ref 15–41)
Albumin: 3 g/dL — ABNORMAL LOW (ref 3.5–5.0)
Anion gap: 11 (ref 5–15)
BUN: 43 mg/dL — AB (ref 6–20)
CALCIUM: 8.9 mg/dL (ref 8.9–10.3)
CO2: 30 mmol/L (ref 22–32)
CREATININE: 1.33 mg/dL — AB (ref 0.44–1.00)
Chloride: 103 mmol/L (ref 101–111)
GFR, EST AFRICAN AMERICAN: 49 mL/min — AB (ref >60)
GFR, EST NON AFRICAN AMERICAN: 42 mL/min — AB (ref >60)
Glucose, Bld: 114 mg/dL — ABNORMAL HIGH (ref 65–99)
Potassium: 3 mmol/L — ABNORMAL LOW (ref 3.5–5.1)
Sodium: 144 mmol/L (ref 135–145)
Total Bilirubin: 1.1 mg/dL (ref 0.3–1.2)
Total Protein: 6.6 g/dL (ref 6.5–8.1)

## 2015-05-03 LAB — MAGNESIUM: MAGNESIUM: 1.7 mg/dL (ref 1.7–2.4)

## 2015-05-03 LAB — POTASSIUM: POTASSIUM: 3.1 mmol/L — AB (ref 3.5–5.1)

## 2015-05-03 MED ORDER — POTASSIUM CHLORIDE CRYS ER 20 MEQ PO TBCR: 40.0000 meq | EXTENDED_RELEASE_TABLET | Freq: Once | ORAL | Status: DC

## 2015-05-03 MED ORDER — MAGNESIUM SULFATE 2 GM/50ML IV SOLN
2.0000 g | Freq: Once | INTRAVENOUS | Status: AC
  Administered 2015-05-03: 2 g via INTRAVENOUS
  Filled 2015-05-03: qty 50

## 2015-05-03 MED ORDER — POTASSIUM CHLORIDE 10 MEQ/100ML IV SOLN
10.0000 meq | INTRAVENOUS | Status: AC
  Administered 2015-05-03 (×2): 10 meq via INTRAVENOUS
  Filled 2015-05-03 (×2): qty 100

## 2015-05-03 NOTE — Progress Notes (Signed)
Not tolerating clamped NG tube.  At [place], c/o full stomach, residuals approx 500cc bilious.  NG restarted on LIS

## 2015-05-03 NOTE — Progress Notes (Signed)
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 General surgery consulted and patient started on IV fluids, pain medication and NG tube. Lactic acid level elevated at 3.2  Repeat lactic acid normal. Repeat abd film shows changes concerning for new obstruction. Continue NG tube.   Assessment/Plan:     SBO (small bowel obstruction): Given persistent white count and tachycardia, concerned about bowel ischemia.Continue IV fluids. White count was trending up therefore started the patient on empiric antibiotics, currently WBC count is normal, lactic acid is normal. NG tube aspirate appears to be bloody, therefore start patient on IV PPI, advance diet to clears, removed NG tube as soon as possible   Acute kidney injury-, prerenal,/contrast nephropathy, creatinine has increased from 0.65-1.77-increased IV fluids to 150 mL/h, improving creatinine.     Essential hypertension sub optimal.     Diabetes mellitus type 2, uncontrolled, with complications: Given elevated blood sugars,  have added Lantus.  CBG (last 3)   Recent Labs  05/02/15 2354 05/03/15 0403 05/03/15 0721  GLUCAP 141* 102* 101*  resume SSI.       Tachycardia, secondary to bowel obstruction, ruled out for pulmonary embolism. Probably from metastatic cancer. Would get an echocardiogram done for persistent tachycardia.   History of rectal cancer: CT imaging done in July concerning for metastatic disease with enlarging pulmonary nodules. Second lung biopsy scheduled this week. Currently on chemotherapy along with surgical colostomy.   Hypokalemia replete as needed, keep K>4 and MG>2, with ongoing sbo. Repeat K tonight.   Code Status: Full  code  Family Communication: Left message for family  Disposition Plan: Continue inpatient,, possible d/c when sbo resolves.    Consultants:  General surgery  Procedures:  None  Antibiotics: Zosyn.  Objective: BP 149/64 mmHg  Pulse 148  Temp(Src) 99 F (37.2 C) (Axillary)  Resp 18  Ht '5\' 6"'$  (1.676 m)  Wt 74.1 kg (163 lb 5.8 oz)  BMI 26.38 kg/m2  SpO2 93%  Intake/Output Summary (Last 24 hours) at 05/03/15 1226 Last data filed at 05/03/15 0930  Gross per 24 hour  Intake   2270 ml  Output   1700 ml  Net    570 ml   Filed Weights   05/02/15 1200  Weight: 74.1 kg (163 lb 5.8 oz)    Exam:   General:  Alert and oriented 3, somnolent  Cardiovascular: Regular rhythm, tachycardic  Respiratory: Clear to auscultation bilaterally  Abdomen: Soft, no bowel sounds, nondistended  Musculoskeletal: No edema   Data Reviewed: Basic Metabolic Panel:  Recent Labs Lab 04/30/15 0940 04/30/15 1735 05/01/15 0654 05/02/15 0440 05/03/15 0545  NA 137  --  140 144 144  K 3.1*  --  3.3* 3.5 3.0*  CL 101  --  103 104 103  CO2 24  --  27 32 30  GLUCOSE 349*  --  318* 176* 114*  BUN 15  --  13 37* 43*  CREATININE 0.72 0.59 0.65 1.77* 1.33*  CALCIUM 9.8  --  9.3 9.0 8.9  MG  --  1.2*  --   --  1.7   Liver Function Tests:  Recent Labs Lab 04/30/15 0940 05/01/15 0654 05/03/15 0545  AST '24 20 23  '$ ALT 22 21 18  ALKPHOS 83 67 48  BILITOT 0.6 1.1 1.1  PROT 7.5 6.8 6.6  ALBUMIN 4.0 3.6 3.0*    Recent Labs Lab 04/30/15 0940  LIPASE 56*   No results for input(s): AMMONIA in the last 168 hours. CBC:  Recent Labs Lab 04/30/15 0940 04/30/15 1735 05/01/15 0654 05/02/15 0440 05/03/15 0545  WBC 12.4* 13.0* 14.1* 16.8* 8.1  NEUTROABS 10.8*  --   --   --   --   HGB 11.6* 11.5* 12.5 12.9 11.0*  HCT 35.6* 35.5* 39.2 40.3 34.5*  MCV 76.1* 77.2* 77.2* 78.3 78.2  PLT 235 244 246 281 257   Cardiac Enzymes:    Recent Labs Lab 04/30/15 1735 05/01/15 0044  05/01/15 0654  TROPONINI <0.03 <0.03 <0.03   BNP (last 3 results)  Recent Labs  05/01/15 0654  BNP 129.5*    ProBNP (last 3 results) No results for input(s): PROBNP in the last 8760 hours.  CBG:  Recent Labs Lab 05/02/15 1715 05/02/15 2040 05/02/15 2354 05/03/15 0403 05/03/15 0721  GLUCAP 183* 171* 141* 102* 101*    No results found for this or any previous visit (from the past 240 hour(s)).   Studies: Dg Abd 2 Views  05/03/2015   CLINICAL DATA:  Left lower quadrant abdominal pain. Small-bowel obstruction.  EXAM: ABDOMEN - 2 VIEW  COMPARISON:  05/01/2015  FINDINGS: There is no evidence of intraperitoneal free air. There are a few new loops of mildly dilated small bowel in the left upper quadrant. Enteric tube terminates over the proximal stomach. Calcifications in the pelvis likely represent phleboliths.  IMPRESSION: New dilatation of a few small-bowel loops in the left upper quadrant concerning for developing obstruction.   Electronically Signed   By: Logan Bores M.D.   On: 05/03/2015 12:10    Scheduled Meds: . antiseptic oral rinse  7 mL Mouth Rinse q12n4p  . chlorhexidine  15 mL Mouth Rinse BID  . enoxaparin (LOVENOX) injection  40 mg Subcutaneous Q24H  . insulin aspart  0-15 Units Subcutaneous 6 times per day  . insulin glargine  10 Units Subcutaneous QHS  . metoprolol  5 mg Intravenous 4 times per day  . piperacillin-tazobactam (ZOSYN)  IV  3.375 g Intravenous 3 times per day  . potassium chloride  40 mEq Oral Once  . sodium chloride  3 mL Intravenous Q12H    Continuous Infusions: . sodium chloride 150 mL/hr at 05/02/15 0900     Time spent: 35 minutes  [facility] Pager [phone] If 7PM-7AM, please contact night-coverage at [email or web address], password [email or web address] 05/03/2015, 12:26 PM  LOS: 3 days

## 2015-05-03 NOTE — Progress Notes (Signed)
Patient ID: Barbara Matthews, female   DOB: 02-23-1956, 59 y.o.   MRN: [id number]  [facility]  HD#: 4  Subjective: Patient in bed.  Complains of LLQ abdominal pain.  NG reconnected last PM with 500cc output.  Patient denies nausea this AM.  Some output overnight from ostomy.  Objective: Vital signs in last 24 hours: Temp:  [99 F (37.2 C)-100.1 F (37.8 C)] 99 F (37.2 C) (09/24 0551) Pulse Rate:  [137-149] 148 (09/24 0551) Resp:  [18-20] 18 (09/24 0551) BP: (117-149)/(55-64) 149/64 mmHg (09/24 0551) SpO2:  [92 %-96 %] 93 % (09/24 0551) Weight:  [74.1 kg (163 lb 5.8 oz)] 74.1 kg (163 lb 5.8 oz) (09/23 1200) Last BM Date: 05/01/15  Intake/Output from previous day: 09/23 0701 - 09/24 0700 In: 2150 [I.V.:2000; IV Piggyback:150] Out: 1700 [Urine:1100; Emesis/NG output:600] Intake/Output this shift:    Physical Exam: HEENT - sclerae clear, mucous membranes moist Neck - soft Chest - clear bilaterally Cor - RRR Abdomen - soft, ostomy left mid abdomen with small formed stool in bag; BS present Ext - no edema, non-tender Neuro - alert & oriented, no focal deficits  Lab Results:   Recent Labs  05/02/15 0440 05/03/15 0545  WBC 16.8* 8.1  HGB 12.9 11.0*  HCT 40.3 34.5*  PLT 281 257   BMET  Recent Labs  05/02/15 0440 05/03/15 0545  NA 144 144  K 3.5 3.0*  CL 104 103  CO2 32 30  GLUCOSE 176* 114*  BUN 37* 43*  CREATININE 1.77* 1.33*  CALCIUM 9.0 8.9   PT/INR No results for input(s): LABPROT, INR in the last 72 hours. Comprehensive Metabolic Panel:    Component Value Date/Time   NA 144 05/03/2015 0545   NA 144 05/02/2015 0440   K 3.0* 05/03/2015 0545   K 3.5 05/02/2015 0440   CL 103 05/03/2015 0545   CL 104 05/02/2015 0440   CO2 30 05/03/2015 0545   CO2 32 05/02/2015 0440   BUN 43* 05/03/2015 0545   BUN 37* 05/02/2015 0440   CREATININE 1.33* 05/03/2015 0545   CREATININE 1.77* 05/02/2015 0440   GLUCOSE 114* 05/03/2015  0545   GLUCOSE 176* 05/02/2015 0440   CALCIUM 8.9 05/03/2015 0545   CALCIUM 9.0 05/02/2015 0440   AST 23 05/03/2015 0545   AST 20 05/01/2015 0654   ALT 18 05/03/2015 0545   ALT 21 05/01/2015 0654   ALKPHOS 48 05/03/2015 0545   ALKPHOS 67 05/01/2015 0654   BILITOT 1.1 05/03/2015 0545   BILITOT 1.1 05/01/2015 0654   PROT 6.6 05/03/2015 0545   PROT 6.8 05/01/2015 0654   ALBUMIN 3.0* 05/03/2015 0545   ALBUMIN 3.6 05/01/2015 0654    Studies/Results: Dg Abd Portable 1v-small Bowel Obstruction Protocol-initial, 8 Hr Delay  05/01/2015   CLINICAL DATA:  Small-bowel protocol.  Contrast given at 11 a.m.  EXAM: PORTABLE ABDOMEN - 1 VIEW  COMPARISON:  05/01/2015 at 7:58 a.m.  FINDINGS: On the previous exam, there is a focus of tubular contrast that project in the right mid abdomen. This is stable.  There is some contrast in the stomach. Some contrast is seen superimposed on the left colostomy. There is no other contrast evident within the abdomen. It is unclear whether this contrast has passed through the bowel and in removed from a colostomy bag. There is no visible small bowel contrast to suggest significant obstruction. No small bowel dilation is seen.  IMPRESSION: No convincing small bowel obstruction. No small  bowel contrast is seen. There is some contrast the left mid abdomen colostomy.   Electronically Signed   By: Lajean Manes M.D.   On: 05/01/2015 19:24    Anti-infectives: Anti-infectives    Start     Dose/Rate Route Frequency Ordered Stop   05/02/15 1300  piperacillin-tazobactam (ZOSYN) IVPB 3.375 g     3.375 g 12.5 mL/hr over 240 Minutes Intravenous 3 times per day 05/02/15 1204        Assessment & Plans: Small bowel obstruction, hx of APR  Will obtain AXR this AM  Leave NG on suction until AXR available for review  OOB, ambulate in halls  NPO, IV hydration  [name], MD, [facility] Office: [place] 05/03/2015

## 2015-05-04 ENCOUNTER — Inpatient Hospital Stay (HOSPITAL_COMMUNITY): Payer: BC Managed Care – PPO

## 2015-05-04 LAB — GLUCOSE, CAPILLARY
GLUCOSE-CAPILLARY: 114 mg/dL — AB (ref 65–99)
GLUCOSE-CAPILLARY: 118 mg/dL — AB (ref 65–99)
GLUCOSE-CAPILLARY: 92 mg/dL (ref 65–99)
Glucose-Capillary: 70 mg/dL (ref 65–99)
Glucose-Capillary: 148 mg/dL — ABNORMAL HIGH (ref 65–99)
Glucose-Capillary: 130 mg/dL — ABNORMAL HIGH (ref 65–99)
Glucose-Capillary: 132 mg/dL — ABNORMAL HIGH (ref 65–99)

## 2015-05-04 LAB — CBC
HCT: 30.4 % — ABNORMAL LOW (ref 36.0–46.0)
Hemoglobin: 9.8 g/dL — ABNORMAL LOW (ref 12.0–15.0)
MCH: 25.2 pg — AB (ref 26.0–34.0)
MCHC: 32.2 g/dL (ref 30.0–36.0)
MCV: 78.1 fL (ref 78.0–100.0)
PLATELETS: 265 10*3/uL (ref 150–400)
RBC: 3.89 MIL/uL (ref 3.87–5.11)
RDW: 17.1 % — AB (ref 11.5–15.5)
WBC: 10.6 10*3/uL — AB (ref 4.0–10.5)

## 2015-05-04 LAB — BASIC METABOLIC PANEL
ANION GAP: 12 (ref 5–15)
BUN: 26 mg/dL — ABNORMAL HIGH (ref 6–20)
CALCIUM: 8.8 mg/dL — AB (ref 8.9–10.3)
CO2: 30 mmol/L (ref 22–32)
CREATININE: 0.88 mg/dL (ref 0.44–1.00)
Chloride: 103 mmol/L (ref 101–111)
Glucose, Bld: 76 mg/dL (ref 65–99)
Potassium: 2.8 mmol/L — ABNORMAL LOW (ref 3.5–5.1)
Sodium: 145 mmol/L (ref 135–145)

## 2015-05-04 LAB — MAGNESIUM: Magnesium: 2.3 mg/dL (ref 1.7–2.4)

## 2015-05-04 MED ORDER — DEXTROSE-NACL 5-0.9 % IV SOLN
INTRAVENOUS | Status: DC
  Administered 2015-05-04: 05:00:00 via INTRAVENOUS

## 2015-05-04 MED ORDER — VANCOMYCIN HCL IN DEXTROSE 1-5 GM/200ML-% IV SOLN
1000.0000 mg | Freq: Two times a day (BID) | INTRAVENOUS | Status: DC
  Administered 2015-05-04 – 2015-05-05 (×3): 1000 mg via INTRAVENOUS
  Filled 2015-05-04 (×4): qty 200

## 2015-05-04 MED ORDER — POTASSIUM CHLORIDE 10 MEQ/100ML IV SOLN
10.0000 meq | INTRAVENOUS | Status: AC
  Administered 2015-05-04 (×8): 10 meq via INTRAVENOUS
  Filled 2015-05-04 (×8): qty 100

## 2015-05-04 MED ORDER — POTASSIUM CHLORIDE IN NACL 40-0.9 MEQ/L-% IV SOLN
INTRAVENOUS | Status: AC
  Administered 2015-05-04 (×3): 100 mL/h via INTRAVENOUS
  Filled 2015-05-04 (×4): qty 1000

## 2015-05-04 MED ORDER — INSULIN GLARGINE 100 UNIT/ML ~~LOC~~ SOLN
5.0000 [IU] | Freq: Every day | SUBCUTANEOUS | Status: DC
  Administered 2015-05-04: 5 [IU] via SUBCUTANEOUS
  Filled 2015-05-04 (×2): qty 0.05

## 2015-05-04 NOTE — Progress Notes (Signed)
  Echocardiogram 2D Echocardiogram has been performed.  Aggie Cosier 05/04/2015, 9:42 AM

## 2015-05-04 NOTE — Progress Notes (Signed)
Patient ID: Barbara Matthews, female   DOB: [birth year], 59 y.o.   MRN: [id number]  [facility]  HD#: 5  Subjective: Patient without complaints, feels better.  Some LLQ abd pain.  Wants to eat and drink.  Objective: Vital signs in last 24 hours: Temp:  [99.2 F (37.3 C)-100.9 F (38.3 C)] 100 F (37.8 C) (09/25 0537) Pulse Rate:  [125-135] 129 (09/25 0537) Resp:  [19-20] 19 (09/25 0537) BP: (141-154)/(60-68) 146/68 mmHg (09/25 0537) SpO2:  [92 %] 92 % (09/25 0537) Last BM Date: 05/01/15  Intake/Output from previous day: 2023-05-22 0701 - 09/25 0700 In: 1920 [P.O.:120; I.V.:1800] Out: 2800 [Emesis/NG output:2800] Intake/Output this shift:    Physical Exam: HEENT - sclerae clear, mucous membranes moist Neck - soft Chest - clear bilaterally Cor - RRR Abdomen - mild distension; minimal tenderness suprapubic; no gas or stool in ostomy bag; NG tube pulled back considerably with oral secretions and oral intake in cannister Ext - no edema, non-tender Neuro - alert & oriented, no focal deficits  Lab Results:   Recent Labs  May 22, 2015 0545 05/04/15 0525  WBC 8.1 10.6*  HGB 11.0* 9.8*  HCT 34.5* 30.4*  PLT 257 265   BMET  Recent Labs  2015-05-22 0545 May 22, 2015 1420 05/04/15 0525  NA 144  --  145  K 3.0* 3.1* 2.8*  CL 103  --  103  CO2 30  --  30  GLUCOSE 114*  --  76  BUN 43*  --  26*  CREATININE 1.33*  --  0.88  CALCIUM 8.9  --  8.8*   PT/INR No results for input(s): LABPROT, INR in the last 72 hours. Comprehensive Metabolic Panel:    Component Value Date/Time   NA 145 05/04/2015 0525   NA 144 2015/05/22 0545   K 2.8* 05/04/2015 0525   K 3.1* 05/22/15 1420   CL 103 05/04/2015 0525   CL 103 2015/05/22 0545   CO2 30 05/04/2015 0525   CO2 30 2015-05-22 0545   BUN 26* 05/04/2015 0525   BUN 43* 2015/05/22 0545   CREATININE 0.88 05/04/2015 0525   CREATININE 1.33* 05/22/2015 0545   GLUCOSE 76 05/04/2015 0525   GLUCOSE 114*  22-May-2015 0545   CALCIUM 8.8* 05/04/2015 0525   CALCIUM 8.9 05/22/2015 0545   AST 23 05/22/15 0545   AST 20 05/01/2015 0654   ALT 18 May 22, 2015 0545   ALT 21 05/01/2015 0654   ALKPHOS 48 05/22/15 0545   ALKPHOS 67 05/01/2015 0654   BILITOT 1.1 2015/05/22 0545   BILITOT 1.1 05/01/2015 0654   PROT 6.6 05-22-15 0545   PROT 6.8 05/01/2015 0654   ALBUMIN 3.0* 05/22/15 0545   ALBUMIN 3.6 05/01/2015 0654    Studies/Results: Dg Abd 2 Views  05-22-2015   CLINICAL DATA:  Left lower quadrant abdominal pain. Small-bowel obstruction.  EXAM: ABDOMEN - 2 VIEW  COMPARISON:  05/01/2015  FINDINGS: There is no evidence of intraperitoneal free air. There are a few new loops of mildly dilated small bowel in the left upper quadrant. Enteric tube terminates over the proximal stomach. Calcifications in the pelvis likely represent phleboliths.  IMPRESSION: New dilatation of a few small-bowel loops in the left upper quadrant concerning for developing obstruction.   Electronically Signed   By: Logan Bores M.D.   On: 22-May-2015 12:10    Anti-infectives: Anti-infectives    Start     Dose/Rate Route Frequency Ordered Stop   05/02/15 1300  piperacillin-tazobactam (ZOSYN) IVPB  3.375 g     3.375 g 12.5 mL/hr over 240 Minutes Intravenous 3 times per day 05/02/15 1204        Assessment & Plans: Small bowel obstruction, hx of APR NG tube not properly positioned - will ask to be advanced and repeat AXR to confirm placement in stomach  Hypokalemia - repletion per medical service  Repeat AXR in AM 9/26 OOB, ambulate in halls NPO except ice chips  IV hydration  [name], MD, [facility] Office: [place] 05/04/2015

## 2015-05-04 NOTE — Progress Notes (Addendum)
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 General surgery consulted and patient started on IV fluids, pain medication and NG tube. Lactic acid level elevated at 3.2.Repeat abd film shows changes concerning for new obstruction. Continue NG tube.    Subjective , low grade fever , LLQ pain,denies cough, hard stool in colostomy bag   Assessment/Plan:     SBO (small bowel obstruction):NG tube not properly positioned - will ask to be advanced and repeat AXR to confirm placement in stomach. Correct electrolytes , Mg OK.Repeat AXR in AM 9/26, NPO    Fever : white count and tachycardia, due to  bowel ischemia. Chest x-ray suspicious for pneumonia .Continue IV fluids. White count was trending up therefore started the patient on empiric antibiotics, currently WBC count is normal, lactic acid is normal.   continue Zosyn, added vancomycin for suspected HCAP    Acute kidney injury-, prerenal,/contrast nephropathy, creatinine has increased from 0.65-1.77-now resolved , continue  IV fluids at  100 mL/h,      Essential hypertension  Stable     Diabetes mellitus type 2,  borderline low blood sugars today,  Reduce Lantus to 5 units .   CBG (last 3)   Recent Labs  05/04/15 0010 05/04/15 0413 05/04/15 0758  GLUCAP 92 70 118*  resume SSI.      Sinus  Tachycardia, secondary to bowel obstruction, ruled out for pulmonary embolism on CTA . Probably from metastatic cancer. Pending echocardiogram   for persistent tachycardia.   History of rectal cancer: CT imaging done in July concerning for metastatic disease with enlarging pulmonary nodules. Second lung biopsy scheduled this week at [facility] . Currently on  chemotherapy along with surgical colostomy.   Hypokalemia replete as needed, keep K>4 and MG>2, with ongoing sbo.    Code Status: Full code  Family Communication: Left message for family  Disposition Plan: Continue inpatient,, possible d/c when sbo resolves.    Consultants:  General surgery  Procedures:  None  Antibiotics: Zosyn.  Objective: BP 146/68 mmHg  Pulse 129  Temp(Src) 100 F (37.8 C) (Oral)  Resp 19  Ht '5\' 6"'$  (1.676 m)  Wt 74.1 kg (163 lb 5.8 oz)  BMI 26.38 kg/m2  SpO2 92%  Intake/Output Summary (Last 24 hours) at 05/04/15 1022 Last data filed at 05/03/15 1843  Gross per 24 hour  Intake   1800 ml  Output   1000 ml  Net    800 ml   Filed Weights   05/02/15 1200  Weight: 74.1 kg (163 lb 5.8 oz)    Exam:   General:  Alert and oriented 3, somnolent  Cardiovascular: Regular rhythm, tachycardic  Respiratory: Clear to auscultation bilaterally  Abdomen: Soft, no bowel sounds, nondistended  Musculoskeletal: No edema   Data Reviewed: Basic Metabolic Panel:  Recent Labs Lab 04/30/15 0940 04/30/15 1735 05/01/15 0654 05/02/15 0440 05/03/15 0545 05/03/15 1420 05/04/15 0525  NA 137  --  140 144 144  --  145  K 3.1*  --  3.3* 3.5 3.0* 3.1* 2.8*  CL 101  --  103 104 103  --  103  CO2 24  --  27 32 30  --  30  GLUCOSE 349*  --  318* 176*  114*  --  76  BUN 15  --  13 37* 43*  --  26*  CREATININE 0.72 0.59 0.65 1.77* 1.33*  --  0.88  CALCIUM 9.8  --  9.3 9.0 8.9  --  8.8*  MG  --  1.2*  --   --  1.7  --  2.3   Liver Function Tests:  Recent Labs Lab 04/30/15 0940 05/01/15 0654 05/03/15 0545  AST '24 20 23  '$ ALT '22 21 18  '$ ALKPHOS 83 67 48  BILITOT 0.6 1.1 1.1  PROT 7.5 6.8 6.6  ALBUMIN 4.0 3.6 3.0*    Recent Labs Lab 04/30/15 0940  LIPASE 56*   No results for input(s): AMMONIA in the last 168 hours. CBC:  Recent Labs Lab 04/30/15 0940 04/30/15 1735 05/01/15 0654 05/02/15 0440 05/03/15 0545 05/04/15 0525  WBC 12.4* 13.0*  14.1* 16.8* 8.1 10.6*  NEUTROABS 10.8*  --   --   --   --   --   HGB 11.6* 11.5* 12.5 12.9 11.0* 9.8*  HCT 35.6* 35.5* 39.2 40.3 34.5* 30.4*  MCV 76.1* 77.2* 77.2* 78.3 78.2 78.1  PLT 235 244 246 281 257 265   Cardiac Enzymes:    Recent Labs Lab 04/30/15 1735 05/01/15 0044 05/01/15 0654  TROPONINI <0.03 <0.03 <0.03   BNP (last 3 results)  Recent Labs  05/01/15 0654  BNP 129.5*    ProBNP (last 3 results) No results for input(s): PROBNP in the last 8760 hours.  CBG:  Recent Labs Lab 05/03/15 1629 05/03/15 2003 05/04/15 0010 05/04/15 0413 05/04/15 0758  GLUCAP 93 97 92 70 118*    No results found for this or any previous visit (from the past 240 hour(s)).   Studies: Dg Abd 1 View  05/04/2015   CLINICAL DATA:  Abdominal distention, follow-up small bowel obstruction  EXAM: ABDOMEN - 1 VIEW  COMPARISON:  Abdominal radiographs dated 05/03/2015. CT abdomen pelvis dated 04/30/2015  FINDINGS: Dilated loops of small bowel in the left mid abdomen, worrisome for small bowel obstruction or possibly adynamic small bowel ileus.  IMPRESSION: Dilated loops of small bowel in the left mid abdomen, worrisome for small bowel obstruction or possible adynamic small bowel ileus.   Electronically Signed   By: Julian Hy M.D.   On: 05/04/2015 09:00   Dg Abd 2 Views  05/03/2015   CLINICAL DATA:  Left lower quadrant abdominal pain. Small-bowel obstruction.  EXAM: ABDOMEN - 2 VIEW  COMPARISON:  05/01/2015  FINDINGS: There is no evidence of intraperitoneal free air. There are a few new loops of mildly dilated small bowel in the left upper quadrant. Enteric tube terminates over the proximal stomach. Calcifications in the pelvis likely represent phleboliths.  IMPRESSION: New dilatation of a few small-bowel loops in the left upper quadrant concerning for developing obstruction.   Electronically Signed   By: Logan Bores M.D.   On: 05/03/2015 12:10    Scheduled Meds: . antiseptic oral rinse  7  mL Mouth Rinse q12n4p  . chlorhexidine  15 mL Mouth Rinse BID  . enoxaparin (LOVENOX) injection  40 mg Subcutaneous Q24H  . insulin aspart  0-15 Units Subcutaneous 6 times per day  . insulin glargine  10 Units Subcutaneous QHS  . metoprolol  5 mg Intravenous 4 times per day  . piperacillin-tazobactam (ZOSYN)  IV  3.375 g Intravenous 3 times per day  . potassium chloride  10 mEq Intravenous Q1 Hr x 4  . sodium chloride  3 mL  Intravenous Q12H    Continuous Infusions: . 0.9 % NaCl with KCl 40 mEq / L       Time spent: 35 minutes  [facility] Pager [phone] If 7PM-7AM, please contact night-coverage at [email or web address], password [facility] 05/04/2015, 10:22 AM  LOS: 4 days

## 2015-05-04 NOTE — Progress Notes (Signed)
ANTIBIOTIC CONSULT NOTE - INITIAL  Pharmacy Consult for Zosyn, add Vancomycin Indication: Intra-abdominal infection, HAP  No Known Allergies  Patient Measurements: Height: '5\' 6"'$  (167.6 cm) Weight: 163 lb 5.8 oz (74.1 kg) IBW/kg (Calculated) : 59.3   Vital Signs: Temp: 100 F (37.8 C) (09/25 0537) Temp Source: Oral (09/25 0537) BP: 146/68 mmHg (09/25 0537) Pulse Rate: 129 (09/25 0537) Intake/Output from previous day: 09/24 0701 - 09/25 0700 In: 1920 [P.O.:120; I.V.:1800] Out: 2800 [Emesis/NG output:2800] Intake/Output from this shift:    Labs:  Recent Labs  05/02/15 0440 05/03/15 0545 05/04/15 0525  WBC 16.8* 8.1 10.6*  HGB 12.9 11.0* 9.8*  PLT 281 257 265  CREATININE 1.77* 1.33* 0.88   Estimated Creatinine Clearance: 70 mL/min (by C-G formula based on Cr of 0.88). No results for input(s): VANCOTROUGH, VANCOPEAK, VANCORANDOM, GENTTROUGH, GENTPEAK, GENTRANDOM, TOBRATROUGH, TOBRAPEAK, TOBRARND, AMIKACINPEAK, AMIKACINTROU, AMIKACIN in the last 72 hours.   Microbiology: No results found for this or any previous visit (from the past 720 hour(s)).  Medical History: Past Medical History  Diagnosis Date  . Hypertension   . Diabetes mellitus without complication   . Elevated cholesterol   . Hemorrhoids   . Anxiety   . Cancer   . Status post colostomy     Assessment: Barbara Matthews with PMH of adenocarcinoma of rectum s/p permanent colostomy who was admitted after having several hours of abdominal pain and found to have a colonic obstruction. Given elevated white count and persistent tachycardia, concern about bowel ischemia. Pharmacy consulted to dose Zosyn for possible intra-abdominal infection. Today, 9/25, concern for possible HAP with CXR findings, low-grade fever and leukocytosis. Pharmacy now consulted to add vancomycin.  9/23 >> Zosyn >>   9/25 >> Vancomycin >>  No cultures ordered  Tmax: 100.62F WBC: elevated and trending up Renal: AKI, SCr now improved to WNL,  70 ml/min CG  Goal of Therapy:  Vancomycin troughs 15-20 mcg/ml Appropriate antibiotic dosing for renal function and indication Eradication of infection  Plan:   Begin Vancomycin 1g IV q12h Check trough at steady state Continue Zosyn 3.375g IV q8h (infuse over 4 hours) Monitor renal function, culture results as available, clinical course.  Peggyann Juba, PharmD, BCPS Pager: [phone] 05/04/2015 11:05 AM

## 2015-05-05 ENCOUNTER — Inpatient Hospital Stay (HOSPITAL_COMMUNITY): Payer: BC Managed Care – PPO

## 2015-05-05 DIAGNOSIS — A419 Sepsis, unspecified organism: Secondary | ICD-10-CM | POA: Diagnosis present

## 2015-05-05 DIAGNOSIS — E876 Hypokalemia: Secondary | ICD-10-CM | POA: Diagnosis present

## 2015-05-05 DIAGNOSIS — J189 Pneumonia, unspecified organism: Secondary | ICD-10-CM | POA: Diagnosis present

## 2015-05-05 DIAGNOSIS — K559 Vascular disorder of intestine, unspecified: Secondary | ICD-10-CM

## 2015-05-05 LAB — COMPREHENSIVE METABOLIC PANEL
ALBUMIN: 2.4 g/dL — AB (ref 3.5–5.0)
ALK PHOS: 56 U/L (ref 38–126)
ALT: 18 U/L (ref 14–54)
ANION GAP: 12 (ref 5–15)
AST: 19 U/L (ref 15–41)
BUN: 14 mg/dL (ref 6–20)
CALCIUM: 8.7 mg/dL — AB (ref 8.9–10.3)
CHLORIDE: 105 mmol/L (ref 101–111)
CO2: 26 mmol/L (ref 22–32)
Creatinine, Ser: 0.68 mg/dL (ref 0.44–1.00)
GFR calc non Af Amer: >60 mL/min (ref >60)
GLUCOSE: 127 mg/dL — AB (ref 65–99)
Potassium: 3.7 mmol/L (ref 3.5–5.1)
SODIUM: 143 mmol/L (ref 135–145)
Total Bilirubin: 1.8 mg/dL — ABNORMAL HIGH (ref 0.3–1.2)
Total Protein: 6.3 g/dL — ABNORMAL LOW (ref 6.5–8.1)

## 2015-05-05 LAB — CBC
HCT: 28.9 % — ABNORMAL LOW (ref 36.0–46.0)
HEMOGLOBIN: 9.3 g/dL — AB (ref 12.0–15.0)
MCH: 25.1 pg — AB (ref 26.0–34.0)
MCHC: 32.2 g/dL (ref 30.0–36.0)
MCV: 78.1 fL (ref 78.0–100.0)
PLATELETS: 273 10*3/uL (ref 150–400)
RBC: 3.7 MIL/uL — ABNORMAL LOW (ref 3.87–5.11)
RDW: 17.2 % — ABNORMAL HIGH (ref 11.5–15.5)
WBC: 14 10*3/uL — ABNORMAL HIGH (ref 4.0–10.5)

## 2015-05-05 LAB — GLUCOSE, CAPILLARY
GLUCOSE-CAPILLARY: 100 mg/dL — AB (ref 65–99)
GLUCOSE-CAPILLARY: 142 mg/dL — AB (ref 65–99)
GLUCOSE-CAPILLARY: 141 mg/dL — AB (ref 65–99)
Glucose-Capillary: 131 mg/dL — ABNORMAL HIGH (ref 65–99)

## 2015-05-05 LAB — PHOSPHORUS: Phosphorus: 1.8 mg/dL — ABNORMAL LOW (ref 2.5–4.6)

## 2015-05-05 MED ORDER — LORAZEPAM 2 MG/ML IJ SOLN: 1.0000 mg | Freq: Every day | INTRAMUSCULAR | Status: DC

## 2015-05-05 MED ORDER — INSULIN ASPART 100 UNIT/ML ~~LOC~~ SOLN: 0.0000 [IU] | SUBCUTANEOUS | Status: DC

## 2015-05-05 MED ORDER — POTASSIUM CHLORIDE IN NACL 40-0.9 MEQ/L-% IV SOLN
INTRAVENOUS | Status: DC
  Administered 2015-05-05: 60 mL/h via INTRAVENOUS
  Filled 2015-05-05: qty 1000

## 2015-05-05 MED ORDER — POTASSIUM CHLORIDE 10 MEQ/100ML IV SOLN
10.0000 meq | INTRAVENOUS | Status: AC
Start: 2015-05-05 — End: 2015-05-05
  Administered 2015-05-05 (×2): 10 meq via INTRAVENOUS
  Filled 2015-05-05 (×2): qty 100

## 2015-05-05 MED ORDER — CHLORHEXIDINE GLUCONATE 0.12 % MT SOLN: 15.0000 mL | Freq: Two times a day (BID) | OROMUCOSAL | Status: DC

## 2015-05-05 MED ORDER — PIPERACILLIN-TAZOBACTAM 3.375 G IVPB: 3.3750 g | Freq: Three times a day (TID) | INTRAVENOUS | Status: DC

## 2015-05-05 MED ORDER — TRACE MINERALS CR-CU-MN-SE-ZN 10-1000-500-60 MCG/ML IV SOLN
INTRAVENOUS | Status: DC
  Administered 2015-05-05: 18:00:00 via INTRAVENOUS
  Filled 2015-05-05: qty 960

## 2015-05-05 MED ORDER — ENOXAPARIN SODIUM 40 MG/0.4ML ~~LOC~~ SOLN: 40.0000 mg | SUBCUTANEOUS | Status: DC

## 2015-05-05 MED ORDER — LEVALBUTEROL HCL 0.63 MG/3ML IN NEBU: 0.6300 mg | INHALATION_SOLUTION | Freq: Four times a day (QID) | RESPIRATORY_TRACT | Status: DC | PRN

## 2015-05-05 MED ORDER — METOPROLOL TARTRATE 1 MG/ML IV SOLN: 5.0000 mg | Freq: Four times a day (QID) | INTRAVENOUS | Status: DC

## 2015-05-05 MED ORDER — CETYLPYRIDINIUM CHLORIDE 0.05 % MT LIQD: 7.0000 mL | Freq: Two times a day (BID) | OROMUCOSAL | Status: DC

## 2015-05-05 MED ORDER — VANCOMYCIN HCL IN DEXTROSE 1-5 GM/200ML-% IV SOLN: 1000.0000 mg | Freq: Two times a day (BID) | INTRAVENOUS | Status: DC

## 2015-05-05 MED ORDER — FAT EMULSION 20 % IV EMUL
240.0000 mL | INTRAVENOUS | Status: DC
Start: 2015-05-05 — End: 2015-05-06
  Administered 2015-05-05: 240 mL via INTRAVENOUS
  Filled 2015-05-05: qty 240

## 2015-05-05 MED ORDER — ONDANSETRON HCL 4 MG/2ML IJ SOLN: 4.0000 mg | Freq: Four times a day (QID) | INTRAMUSCULAR | Status: DC | PRN

## 2015-05-05 MED ORDER — POTASSIUM PHOSPHATES 15 MMOLE/5ML IV SOLN
10.0000 mmol | Freq: Once | INTRAVENOUS | Status: DC
  Administered 2015-05-05: 10 mmol via INTRAVENOUS
  Filled 2015-05-05 (×2): qty 3.33

## 2015-05-05 MED ORDER — HYDRALAZINE HCL 20 MG/ML IJ SOLN: 10.0000 mg | Freq: Four times a day (QID) | INTRAMUSCULAR | Status: DC | PRN

## 2015-05-05 MED ORDER — INSULIN GLARGINE 100 UNIT/ML ~~LOC~~ SOLN: 5.0000 [IU] | Freq: Every day | SUBCUTANEOUS | Status: DC

## 2015-05-05 MED ORDER — HYOSCYAMINE SULFATE 0.125 MG SL SUBL
0.1250 mg | SUBLINGUAL_TABLET | Freq: Once | SUBLINGUAL | Status: AC
  Administered 2015-05-05: 0.125 mg via SUBLINGUAL
  Filled 2015-05-05: qty 1

## 2015-05-05 MED ORDER — HYDROMORPHONE HCL 1 MG/ML IJ SOLN: 0.5000 mg | INTRAMUSCULAR | Status: DC | PRN

## 2015-05-05 NOTE — Progress Notes (Addendum)
Report given to [place] at [facility]. Patient's is transporting to [employer] floor, room 1020. Patient is made aware. Patient is waiting transport. Family at bedside.

## 2015-05-05 NOTE — Discharge Summary (Addendum)
Physician Discharge Summary  Barbara Matthews YWV:[id number] DOB: 05-Oct-1955 DOA: 04/30/2015  PCP: Anselmo Pickler, DO  Admit date: 04/30/2015 Discharge date: 05/05/2015   Recommendations for Outpatient Follow-Up:   1. The patient is being transferred to her primary treating physicians/surgeon at [facility] per her request.   Discharge Diagnosis:   Principal Problem:    SBO (small bowel obstruction) Active Problems:    Anal cancer-adenocarcinoma    Essential hypertension    Diabetes mellitus type 2, uncontrolled, with complications    Tachycardia    Lactic acidosis    Intestinal occlusion    Sepsis    HCAP (healthcare-associated pneumonia)    Small bowel ischemia    Hypokalemia    Hypomagnesemia   Discharge disposition:  [place], surgical service.  Dr. Guerry Minors, accepting MD.  Discharge Condition: Stable.  Diet recommendation: NPO.  Consider TNA.   History of Present Illness:   Barbara Matthews is an 59 y.o. 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 General surgery consulted and patient started on IV fluids, pain medication and NG tube. Lactic acid level elevated at 3.2.Repeat abd film showed changes concerning for new obstruction.   Hospital Course by Problem:   Principal Problem:  SBO (small bowel obstruction) - Admitted and surgical consultation obtained.  - Treated with conservative measures with bowel rest, NG tube gastric decompression, IV fluids. - Repeat abdominal films today showed persistent obstruction with increased gaseous distention/air fluid levels. - General surgery following. Patient declined surgical intervention here, and requested transfer to [facility]. - Transfer arranged, Dr. Guerry Minors accepting MD.  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 Charma Igo at [facility].  - Received treatment by Dr. Rushie Nyhan at [facility] and is status post neo-adjuvant Xeloda/XRT. - Has had a CT scan showing pulmonary nodules, being worked up at [facility] for pulmonary metastasis.  - FNA of left lower lobe nodule was nondiagnostic. Second lung biopsy scheduled.   Sepsis secondary to Possible HCAP versus bowel ischemia - Patient was started on empiric antibiotics given fever, tachycardia and leukocytosis. - Currently on Zosyn and vancomycin to cover both potential etiologies.   Hypokalemia/hypomagnesemia  - Monitoring and replacing as needed.   Acute kidney injury  - Multifactorial with dehydration, contrast nephropathy contributory. - Resolved with IV fluids.   Essential hypertension - Normally on Norvasc, Cozaar which is currently on hold. - Continue hydralazine as needed.   Diabetes mellitus type 2, uncontrolled, with complications - Currently being managed with SSI, moderate scale, every 4 hours and Lantus 5 units daily. - CBGs 114-148.   Tachycardia - Heart rate 105-129. CT chest negative for PE.   Lactic acidosis - Lactate cleared with hydration.   DVT Prophylaxis - Lovenox ordered.    Medical Consultants:    Dr. Leighton Ruff, [place]   Discharge Exam:   Filed Vitals:   05/05/15 1500  BP: 160/90  Pulse: 122  Temp: 98.2 F (36.8 C)  Resp: 20   Filed Vitals:   05/04/15 2209 05/05/15 0619 05/05/15 0900 05/05/15 1500  BP: 165/85 154/77 160/78 160/90  Pulse: 124 105  122  Temp: 99 F (37.2 C) 98.6 F (37 C) 98.4 F (36.9 C) 98.2 F (36.8 C)  TempSrc: Oral Oral Oral Oral  Resp: '19 20 20 20  '$ Height:  Weight:      SpO2: 94% 93% 97% 100%    Gen: Restless Cardiovascular: Tachy, No M/R/G Respiratory: Lungs CTAB Gastrointestinal: Abdomen distended, sore to touch, ostomy with small tan formed stool Extremities: No C/E/C  The results  of significant diagnostics from this hospitalization (including imaging, microbiology, ancillary and laboratory) are listed below for reference.     Procedures and Diagnostic Studies:   Dg Abd 1 View  05/04/2015   CLINICAL DATA:  Abdominal distention, follow-up small bowel obstruction  EXAM: ABDOMEN - 1 VIEW  COMPARISON:  Abdominal radiographs dated 05/03/2015. CT abdomen pelvis dated 04/30/2015  FINDINGS: Dilated loops of small bowel in the left mid abdomen, worrisome for small bowel obstruction or possibly adynamic small bowel ileus.  IMPRESSION: Dilated loops of small bowel in the left mid abdomen, worrisome for small bowel obstruction or possible adynamic small bowel ileus.   Electronically Signed   By: Julian Hy M.D.   On: 05/04/2015 09:00   Ct Angio Chest Pe W/cm &/or Wo Cm  04/30/2015   CLINICAL DATA:  59 year old female with a history of shortness of breath and concern for pulmonary emboli.  EXAM: CT ANGIOGRAPHY CHEST WITH CONTRAST  TECHNIQUE: Multidetector CT imaging of the chest was performed using the standard protocol during bolus administration of intravenous contrast. Multiplanar CT image reconstructions and MIPs were obtained to evaluate the vascular anatomy.  CONTRAST:  141m OMNIPAQUE IOHEXOL 350 MG/ML SOLN  COMPARISON:  Abdominal CT 04/30/2015, PET-CT 12/25/2013, chest CT 11/20/2013  FINDINGS: Chest:  Port catheter on the right chest wall via right IJ approach.  No axillary or supraclavicular adenopathy.  Nodular appearance of the thyroid with hypodense/ hypoenhancing small lesions inferiorly. Nonspecific by CT. Ill-defined low-density soft tissue inferior to the thyroid extending to the aortic arch was present on comparison PET-CT and chest CT, uncertain significance.  Enteric catheter within the esophagus, terminating out of the field of view.  Several mediastinal lymph nodes, none of which are enlarged by CT size criteria or have suspicious features.  Unremarkable appearance of  the esophagus.  Unremarkable course caliber and contour of the thoracic aorta without dissection flap, aneurysm, periaortic fluid.  No central, lobar, segmental, or proximal subsegmental filling defects to indicate pulmonary emboli.  Heart size within normal limits without pericardial fluid/ thickening.  Compared to the prior PET-CT and chest CT of 2015, there has been interval development/enlargement of multiple bilateral pulmonary nodules.  The largest is in the right middle lobe measuring 14 mm (image 46). Remainder of the nodules are smaller than this diameter, and include right upper lobe (image 14, 7 mm), left upper lobe (image 30, 6 mm), right lower lobe (image 58, 11 mm) and left lower lobe (image 38, 8 mm, image 40, 5 mm, image 60, 8 mm, image 63, 13 mm)  No confluent airspace disease.  No pneumothorax or pleural effusion.  Upper abdomen:  Hemangioma of the liver.  Additional findings of the abdomen are better characterized on dedicated contrast enhanced CT of today's date. Please refer to this report.  Musculoskeletal:  No displaced fracture identified.  Focal sclerotic lesion at the angle of the right ninth rib, was present on the previous PET-CT.  Review of the MIP images confirms the above findings.  IMPRESSION: Study is negative for pulmonary emboli.  New/enlarging bilateral pulmonary nodules involving all lobes, compatible with metastatic disease. Largest in the right middle lobe measures 14 mm.  Sclerotic lesion at the angle of the right ninth rib, present on comparison  CT of Dec 25, 2013, and could potentially be benign.  Signed,  Dulcy Fanny. Earleen Newport, DO  [facility]  [facility]   Electronically Signed   By: Corrie Mckusick D.O.   On: 04/30/2015 17:01   Ct Abdomen Pelvis W Contrast  04/30/2015   CLINICAL DATA:  Abdominal pain, back pain, nausea, vomiting. History of anal cancer. Last chemotherapy treatment in April.  EXAM: CT ABDOMEN AND PELVIS WITH  CONTRAST  TECHNIQUE: Multidetector CT imaging of the abdomen and pelvis was performed using the standard protocol following bolus administration of intravenous contrast.  CONTRAST:  82m OMNIPAQUE IOHEXOL 300 MG/ML SOLN, 1050mOMNIPAQUE IOHEXOL 300 MG/ML SOLN  COMPARISON:  PET CT 12/25/2013  FINDINGS: Enlarging pulmonary nodules in the lung bases. Partially cavitary nodule in the right lower lobe measures 11 mm on image 9 compared with 3 mm previously. 11 mm nodule in the left lower lobe on image 8 previously measured 5 mm. Second enlarging left lower lobe nodule on image 9 in the posterior left lower lobe. These are most compatible with metastases. Heart is normal size. No pleural effusions.  Liver, gallbladder, spleen, pancreas, adrenals and kidneys are unremarkable.  Left upper quadrant ostomy noted. There are mildly prominent small bowel loop in s in the pelvis with air-fluid levels. Cannot completely exclude partial early low grade small bowel obstruction in the pelvis. No well-defined transition point.  Small amount of free fluid in the pelvis. Urinary bladder is unremarkable. Prior hysterectomy. Aorta is normal caliber. No adenopathy or free air. No acute bony abnormality or focal bone lesion. Degenerative changes in the lumbar spine.  IMPRESSION: Postoperative changes with left abdominal ostomy small bowel obstruction or focal ileus.  Small amount of free fluid in the pelvis.  Enlarging bilateral lower lobe pulmonary nodules compatible with metastases.   Electronically Signed   By: KeRolm Baptise.D.   On: 04/30/2015 11:00   Dg Chest Port 1 View  05/04/2015   CLINICAL DATA:  Shortness of breath  EXAM: PORTABLE CHEST 1 VIEW  COMPARISON:  04/30/2015  FINDINGS: Mild bibasilar opacities, likely atelectasis, pneumonia not excluded. No pleural effusion or pneumothorax.  Heart is top-normal in size.  Right chest power port terminating in the lower SVC.  Enteric tube terminating in the gastric cardia.  IMPRESSION:  Mild bibasilar opacities, likely atelectasis, pneumonia not excluded.   Electronically Signed   By: SrJulian Hy.D.   On: 05/04/2015 10:43   Dg Chest Port 1 View  04/30/2015   CLINICAL DATA:  Port-A-Cath placement, hypertension, diabetes mellitus, anal adenocarcinoma  EXAM: PORTABLE CHEST - 1 VIEW  COMPARISON:  Portable exam 1846 hr compared to CT chest of 04/30/2015  FINDINGS: RIGHT jugular dual-lumen Port-A-Cath with tip projecting over SVC.  Nasogastric tube extends into stomach.  Enlargement of cardiac silhouette.  Mediastinal contours and pulmonary vascularity normal.  Scattered pulmonary nodules again identified.  No gross infiltrate, pleural effusion or pneumothorax.  No acute osseous findings.  IMPRESSION: No pneumothorax following Port-A-Cath placement.  Enlargement of cardiac silhouette.  Pulmonary nodules question metastatic disease   Electronically Signed   By: MaLavonia Dana.D.   On: 04/30/2015 19:36   Dg Abd 2 Views  05/05/2015   CLINICAL DATA:  Mid abdominal discomfort, abdominal distention, with generalized weakness  EXAM: ABDOMEN - 2 VIEW  COMPARISON:  Supine abdominal film dated May 04, 2015  FINDINGS: There remain numerous loops of mildly distended gas-filled small bowel in the mid and upper abdomen.  There is only a small amount of colonic gas and stool. There is no rectal gas. No free extraluminal gas collections are observed. The esophagogastric tube tip and proximal port lie in the stomach. There is mild bibasilar atelectasis and small right pleural effusion. There are mild degenerative changes of the lumbar spine. There are phleboliths within the pelvis.  IMPRESSION: Findings consistent with small bowel ileus or partial mid to distal small bowel obstruction. The degree of gaseous distention has decreased somewhat.   Electronically Signed   By: David  Martinique M.D.   On: 05/05/2015 08:03   Dg Abd 2 Views  05/03/2015   CLINICAL DATA:  Left lower quadrant abdominal pain.  Small-bowel obstruction.  EXAM: ABDOMEN - 2 VIEW  COMPARISON:  05/01/2015  FINDINGS: There is no evidence of intraperitoneal free air. There are a few new loops of mildly dilated small bowel in the left upper quadrant. Enteric tube terminates over the proximal stomach. Calcifications in the pelvis likely represent phleboliths.  IMPRESSION: New dilatation of a few small-bowel loops in the left upper quadrant concerning for developing obstruction.   Electronically Signed   By: Logan Bores M.D.   On: 05/03/2015 12:10   Dg Abd Portable 1v  05/04/2015   CLINICAL DATA:  NG tube placement  EXAM: PORTABLE ABDOMEN - 1 VIEW  COMPARISON:  05/04/2015  FINDINGS: NG tube has been placed and coils in the fundus of the stomach. Continued small bowel distention. No free air organomegaly.  IMPRESSION: NG tube coils in the fundus of the stomach.   Electronically Signed   By: Rolm Baptise M.D.   On: 05/04/2015 10:24   Dg Abd Portable 1v-small Bowel Obstruction Protocol-initial, 8 Hr Delay  05/01/2015   CLINICAL DATA:  Small-bowel protocol.  Contrast given at 11 a.m.  EXAM: PORTABLE ABDOMEN - 1 VIEW  COMPARISON:  05/01/2015 at 7:58 a.m.  FINDINGS: On the previous exam, there is a focus of tubular contrast that project in the right mid abdomen. This is stable.  There is some contrast in the stomach. Some contrast is seen superimposed on the left colostomy. There is no other contrast evident within the abdomen. It is unclear whether this contrast has passed through the bowel and in removed from a colostomy bag. There is no visible small bowel contrast to suggest significant obstruction. No small bowel dilation is seen.  IMPRESSION: No convincing small bowel obstruction. No small bowel contrast is seen. There is some contrast the left mid abdomen colostomy.   Electronically Signed   By: Lajean Manes M.D.   On: 05/01/2015 19:24   Dg Abd Portable 1v  05/01/2015   CLINICAL DATA:  Followup slow  EXAM: PORTABLE ABDOMEN - 1 VIEW   COMPARISON:  04/30/2015  FINDINGS: Left upper quadrant ostomy is again identified. Scattered large and small bowel gas is noted. No definitive obstructive changes are seen although the degree of fluid distension seen previously may preclude adequate evaluation on plain film. Contrast material is noted within the gallbladder no free air is seen. No abnormal mass is noted. A nasogastric catheter is seen within the stomach.  IMPRESSION: No definitive obstructive changes are noted although the degree of dilated fluid-filled bowel would not be well appreciated on this exam.   Electronically Signed   By: Inez Catalina M.D.   On: 05/01/2015 08:29     Labs:   Basic Metabolic Panel:  Recent Labs Lab 04/30/15 1735 05/01/15 1829 05/02/15 0440 05/03/15 0545  05/04/15 0525 05/05/15 0505  NA  --  140 144 144  --  145 143  K  --  3.3* 3.5 3.0*  < > 2.8* 3.7  CL  --  103 104 103  --  103 105  CO2  --  27 32 30  --  30 26  GLUCOSE  --  318* 176* 114*  --  76 127*  BUN  --  13 37* 43*  --  26* 14  CREATININE 0.59 0.65 1.77* 1.33*  --  0.88 0.68  CALCIUM  --  9.3 9.0 8.9  --  8.8* 8.7*  MG 1.2*  --   --  1.7  --  2.3  --   < > = values in this interval not displayed. GFR Estimated Creatinine Clearance: 77 mL/min (by C-G formula based on Cr of 0.68). Liver Function Tests:  Recent Labs Lab 04/30/15 0940 05/01/15 0654 05/03/15 0545 05/05/15 0505  AST '24 20 23 19  '$ ALT '22 21 18 18  '$ ALKPHOS 83 67 48 56  BILITOT 0.6 1.1 1.1 1.8*  PROT 7.5 6.8 6.6 6.3*  ALBUMIN 4.0 3.6 3.0* 2.4*    Recent Labs Lab 04/30/15 0940  LIPASE 56*   CBC:  Recent Labs Lab 04/30/15 0940  05/01/15 0654 05/02/15 0440 05/03/15 0545 05/04/15 0525 05/05/15 0505  WBC 12.4*  < > 14.1* 16.8* 8.1 10.6* 14.0*  NEUTROABS 10.8*  --   --   --   --   --   --   HGB 11.6*  < > 12.5 12.9 11.0* 9.8* 9.3*  HCT 35.6*  < > 39.2 40.3 34.5* 30.4* 28.9*  MCV 76.1*  < > 77.2* 78.3 78.2 78.1 78.1  PLT 235  < > 246 281 257 265 273    < > = values in this interval not displayed. Cardiac Enzymes:  Recent Labs Lab 04/30/15 1735 05/01/15 0044 05/01/15 0654  TROPONINI <0.03 <0.03 <0.03  CBG:  Recent Labs Lab 05/04/15 2348 05/05/15 0354 05/05/15 0816 05/05/15 1306 05/05/15 1632  GLUCAP 114* 131* 100* 142* 141*     Discharge Instructions:   Discharge Instructions    Increase activity slowly    Complete by:  As directed             Medication List    STOP taking these medications        amLODipine 10 MG tablet  Commonly known as:  NORVASC     CHOICEFUL MULTIVITAMIN Caps     Fish Oil Oil     FUSION PLUS Caps     glipiZIDE 10 MG tablet  Commonly known as:  GLUCOTROL     losartan 100 MG tablet  Commonly known as:  COZAAR      TAKE these medications        antiseptic oral rinse 0.05 % Liqd solution  Commonly known as:  CPC / CETYLPYRIDINIUM CHLORIDE 0.05%  7 mLs by Mouth Rinse route 2 times daily at 12 noon and 4 pm.     chlorhexidine 0.12 % solution  Commonly known as:  PERIDEX  15 mLs by Mouth Rinse route 2 (two) times daily.     enoxaparin 40 MG/0.4ML injection  Commonly known as:  LOVENOX  Inject 0.4 mLs (40 mg total) into the skin daily.     hydrALAZINE 20 MG/ML injection  Commonly known as:  APRESOLINE  Inject 0.5 mLs (10 mg total) into the vein every 6 (six) hours as needed (sbp>160).     HYDROmorphone 1 MG/ML injection  Commonly known as:  DILAUDID  Inject 0.5 mLs (0.5 mg total) into the vein every 4 (four) hours as needed for severe pain.     insulin aspart 100 UNIT/ML injection  Commonly known as:  novoLOG  Inject 0-15 Units into the skin every 4 (four) hours.     insulin glargine 100 UNIT/ML injection  Commonly known as:  LANTUS  Inject 0.05 mLs (5 Units total) into the skin at bedtime.     levalbuterol 0.63 MG/3ML nebulizer solution  Commonly known as:  XOPENEX  Take 3 mLs (0.63 mg total) by nebulization every 6 (six) hours as needed for wheezing or shortness of  breath.     LORazepam 2 MG/ML injection  Commonly known as:  ATIVAN  Inject 0.5 mLs (1 mg total) into the vein at bedtime.     metoprolol 1 MG/ML injection  Commonly known as:  LOPRESSOR  Inject 5 mLs (5 mg total) into the vein every 6 (six) hours.     ondansetron 4 MG/2ML Soln injection  Commonly known as:  ZOFRAN  Inject 2 mLs (4 mg total) into the vein every 6 (six) hours as needed for nausea.     piperacillin-tazobactam 3.375 GM/50ML IVPB  Commonly known as:  ZOSYN  Inject 50 mLs (3.375 g total) into the vein every 8 (eight) hours.     vancomycin 1 GM/200ML Soln  Commonly known as:  VANCOCIN  Inject 200 mLs (1,000 mg total) into the vein every 12 (twelve) hours.          Time coordinating transfer: 45 minutes.  Signed:  RAMA,CHRISTINA  Pager [phone] [facility] 05/05/2015, 4:45 PM

## 2015-05-05 NOTE — Progress Notes (Signed)
Patient ID: Barbara Matthews, female   DOB: 14-Apr-1956, 59 y.o.   MRN: [id number]    Subjective: Pt c/o some crampy abdominal pain.  No action in her bag.  Objective: Vital signs in last 24 hours: Temp:  [98.4 F (36.9 C)-99.3 F (37.4 C)] 98.4 F (36.9 C) (09/26 0900) Pulse Rate:  [105-128] 105 (09/26 0619) Resp:  [18-20] 20 (09/26 0900) BP: (154-166)/(71-85) 160/78 mmHg (09/26 0900) SpO2:  [93 %-97 %] 97 % (09/26 0900) Last BM Date: 05/04/15  Intake/Output from previous day: 09/25 0701 - 09/26 0700 In: 1830 [I.V.:1650; NG/GT:120; IV Piggyback:60] Out: 2700 [Emesis/NG output:2700] Intake/Output this shift:    PE: Abd: soft, but mild bloating, hypoactive BS, mildly tender, ostomy with no output today.  No air.  Small amount of hard stool in bag that is residual and not new.  NGT with bilious output Heart: regular Lungs: CTAB   Lab Results:   Recent Labs  05/04/15 0525 05/05/15 0505  WBC 10.6* 14.0*  HGB 9.8* 9.3*  HCT 30.4* 28.9*  PLT 265 273   BMET  Recent Labs  05/04/15 0525 05/05/15 0505  NA 145 143  K 2.8* 3.7  CL 103 105  CO2 30 26  GLUCOSE 76 127*  BUN 26* 14  CREATININE 0.88 0.68  CALCIUM 8.8* 8.7*   PT/INR No results for input(s): LABPROT, INR in the last 72 hours. CMP     Component Value Date/Time   NA 143 05/05/2015 0505   K 3.7 05/05/2015 0505   CL 105 05/05/2015 0505   CO2 26 05/05/2015 0505   GLUCOSE 127* 05/05/2015 0505   BUN 14 05/05/2015 0505   CREATININE 0.68 05/05/2015 0505   CALCIUM 8.7* 05/05/2015 0505   PROT 6.3* 05/05/2015 0505   ALBUMIN 2.4* 05/05/2015 0505   AST 19 05/05/2015 0505   ALT 18 05/05/2015 0505   ALKPHOS 56 05/05/2015 0505   BILITOT 1.8* 05/05/2015 0505   GFRNONAA >60 05/05/2015 0505   GFRAA >60 05/05/2015 0505   Lipase     Component Value Date/Time   LIPASE 56* 04/30/2015 0940       Studies/Results: Dg Abd 1 View  05/04/2015   CLINICAL DATA:  Abdominal distention, follow-up small bowel obstruction   EXAM: ABDOMEN - 1 VIEW  COMPARISON:  Abdominal radiographs dated 05/03/2015. CT abdomen pelvis dated 04/30/2015  FINDINGS: Dilated loops of small bowel in the left mid abdomen, worrisome for small bowel obstruction or possibly adynamic small bowel ileus.  IMPRESSION: Dilated loops of small bowel in the left mid abdomen, worrisome for small bowel obstruction or possible adynamic small bowel ileus.   Electronically Signed   By: Julian Hy M.D.   On: 05/04/2015 09:00   Dg Chest Port 1 View  05/04/2015   CLINICAL DATA:  Shortness of breath  EXAM: PORTABLE CHEST 1 VIEW  COMPARISON:  04/30/2015  FINDINGS: Mild bibasilar opacities, likely atelectasis, pneumonia not excluded. No pleural effusion or pneumothorax.  Heart is top-normal in size.  Right chest power port terminating in the lower SVC.  Enteric tube terminating in the gastric cardia.  IMPRESSION: Mild bibasilar opacities, likely atelectasis, pneumonia not excluded.   Electronically Signed   By: Julian Hy M.D.   On: 05/04/2015 10:43   Dg Abd 2 Views  05/05/2015   CLINICAL DATA:  Mid abdominal discomfort, abdominal distention, with generalized weakness  EXAM: ABDOMEN - 2 VIEW  COMPARISON:  Supine abdominal film dated May 04, 2015  FINDINGS: There remain numerous loops of  mildly distended gas-filled small bowel in the mid and upper abdomen. There is only a small amount of colonic gas and stool. There is no rectal gas. No free extraluminal gas collections are observed. The esophagogastric tube tip and proximal port lie in the stomach. There is mild bibasilar atelectasis and small right pleural effusion. There are mild degenerative changes of the lumbar spine. There are phleboliths within the pelvis.  IMPRESSION: Findings consistent with small bowel ileus or partial mid to distal small bowel obstruction. The degree of gaseous distention has decreased somewhat.   Electronically Signed   By: David  Martinique M.D.   On: 05/05/2015 08:03   Dg Abd 2  Views  05/03/2015   CLINICAL DATA:  Left lower quadrant abdominal pain. Small-bowel obstruction.  EXAM: ABDOMEN - 2 VIEW  COMPARISON:  05/01/2015  FINDINGS: There is no evidence of intraperitoneal free air. There are a few new loops of mildly dilated small bowel in the left upper quadrant. Enteric tube terminates over the proximal stomach. Calcifications in the pelvis likely represent phleboliths.  IMPRESSION: New dilatation of a few small-bowel loops in the left upper quadrant concerning for developing obstruction.   Electronically Signed   By: Logan Bores M.D.   On: 05/03/2015 12:10   Dg Abd Portable 1v  05/04/2015   CLINICAL DATA:  NG tube placement  EXAM: PORTABLE ABDOMEN - 1 VIEW  COMPARISON:  05/04/2015  FINDINGS: NG tube has been placed and coils in the fundus of the stomach. Continued small bowel distention. No free air organomegaly.  IMPRESSION: NG tube coils in the fundus of the stomach.   Electronically Signed   By: Rolm Baptise M.D.   On: 05/04/2015 10:24    Anti-infectives: Anti-infectives    Start     Dose/Rate Route Frequency Ordered Stop   05/04/15 1200  vancomycin (VANCOCIN) IVPB 1000 mg/200 mL premix     1,000 mg 200 mL/hr over 60 Minutes Intravenous Every 12 hours 05/04/15 1100     05/02/15 1300  piperacillin-tazobactam (ZOSYN) IVPB 3.375 g     3.375 g 12.5 mL/hr over 240 Minutes Intravenous 3 times per day 05/02/15 1204         Assessment/Plan  1. SBO, s/p APR at [facility] -patient's films do not show much improvement and bowel obstruction clinically still is present as well.  -all of patient's current care is under MDs at [facility].  She has been here fore 5 days so far with no real progression of her bowel obstruction.  She would prefer if she needed an operation (which I think is likely) to be transferred back to [facility] for surgical intervention.   -will need to discuss with primary team.   LOS: 5 days    OSBORNE,KELLY E 05/05/2015, 11:35 AM Pager: [phone]

## 2015-05-05 NOTE — Progress Notes (Addendum)
PARENTERAL NUTRITION CONSULT NOTE - INITIAL  Pharmacy Consult for TPN Indication: SBO  No Known Allergies  Patient Measurements: Height: '5\' 6"'$  (167.6 cm) Weight: 163 lb 5.8 oz (74.1 kg) IBW/kg (Calculated) : 59.3 Adjusted Body Weight: Usual Weight:   Vital Signs: Temp: 98.4 F (36.9 C) (09/26 0900) Temp Source: Oral (09/26 0900) BP: 160/78 mmHg (09/26 0900) Pulse Rate: 105 (09/26 0619) Intake/Output from previous day: 09/25 0701 - 09/26 0700 In: 1830 [I.V.:1650; NG/GT:120; IV Piggyback:60] Out: 2700 [Emesis/NG output:2700] Intake/Output from this shift:    Labs:  Recent Labs  05/03/15 0545 05/04/15 0525 05/05/15 0505  WBC 8.1 10.6* 14.0*  HGB 11.0* 9.8* 9.3*  HCT 34.5* 30.4* 28.9*  PLT 257 265 273     Recent Labs  05/03/15 0545 05/03/15 1420 05/04/15 0525 05/05/15 0505  NA 144  --  145 143  K 3.0* 3.1* 2.8* 3.7  CL 103  --  103 105  CO2 30  --  30 26  GLUCOSE 114*  --  76 127*  BUN 43*  --  26* 14  CREATININE 1.33*  --  0.88 0.68  CALCIUM 8.9  --  8.8* 8.7*  MG 1.7  --  2.3  --   PROT 6.6  --   --  6.3*  ALBUMIN 3.0*  --   --  2.4*  AST 23  --   --  19  ALT 18  --   --  18  ALKPHOS 48  --   --  56  BILITOT 1.1  --   --  1.8*   Estimated Creatinine Clearance: 77 mL/min (by C-G formula based on Cr of 0.68).    Recent Labs  05/05/15 0354 05/05/15 0816 05/05/15 1306  GLUCAP 131* 100* 142*    Medical History: Past Medical History  Diagnosis Date  . Hypertension   . Diabetes mellitus without complication   . Elevated cholesterol   . Hemorrhoids   . Anxiety   . Cancer   . 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  She presented to ED on 9/21 with c/o abd pain.  CT abd on 9/21 is suspicious for  L abdominal ostomy SBO or focal ileus.  To start TPN for SBO.  Significant events:   Today:   Glucose (goal <150): cbgs <150  Electrolytes: K 3.7, Mag 2.3 on 9/25, CoCa wnl  Renal: scr wnl  LFTs: wnl, Tbili slightly elevated at 1.8  TGs: pending  Prealbumin:  pending  NUTRITIONAL GOALS                                                                                             RD recs: pending Clinimix E 5/15 at a goal rate of 90 ml/hr + 20% fat emulsion at 10 ml/hr to provide: 108 g/day protein, 2013 Kcal/day.  PLAN                                                                                                                         1) phosphorous level stat 2) potassium chloride 10 mEq IV x2 3) At 1800 today:  Start Clinimix E 5/15 at 40 ml/hr.  20% fat emulsion at 10 ml/hr.  Plan to advance as tolerated to the goal rate.  TPN to contain standard multivitamins and trace elements.  Reduce IVF to 60 ml/hr.  Continue moderate SSI q4h and lantus  TPN lab panels on Mondays & Thursdays.  F/u daily.  Sade Mehlhoff P 05/05/2015,2:47 PM  Adden: Phos level now lack low at 1.8.  Will  Replete with potassium phosphate 10 mmol x1.  Will f/u with patient in the morning if she has not been transferred to [facility].

## 2015-05-05 NOTE — Progress Notes (Addendum)
Progress Note   [name] XLK:[id number] DOB: 11-May-1956 DOA: 04/30/2015 PCP: Anselmo Pickler, DO   Brief Narrative:   Barbara Matthews is an 59 y.o. 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 General surgery consulted and patient started on IV fluids, pain medication and NG tube. Lactic acid level elevated at 3.2.Repeat abd film shows changes concerning for new obstruction.   Assessment/Plan:   Principal Problem:   SBO (small bowel obstruction) - Admitted and surgical consultation obtained.  - Continue conservative measures with bowel rest, NG tube gastric decompression, IV fluids. - Repeat abdominal films today show persistent obstruction with increased gaseous distention/air fluid levels (personally reviewed). - General surgery following.  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 Charma Igo at [facility]. - Has seen Dr. Marin Olp, but this type of cancer typically not responsive to either chemotherapy or radiation therapy. - Received treatment by Dr. Rushie Nyhan at [facility] and is status post neo-adjuvant Xeloda/XRT. - Has had a CT scan showing pulmonary nodules, being worked up at [facility] for pulmonary metastasis.  - FNA of left lower lobe nodule was nondiagnostic. Second lung biopsy scheduled.    Sepsis secondary to Possible HCAP versus bowel ischemia - Patient was started on empiric antibiotics given fever, tachycardia and leukocytosis. - Currently on Zosyn and vancomycin to cover both potential etiologies.    Hypokalemia/hypomagnesemia  - Monitoring and replacing as needed.    Acute kidney injury  - Multifactorial with dehydration, contrast nephropathy contributory. - Resolved with IV fluids.    Essential hypertension -  Normally on Norvasc, Cozaar which is currently on hold. - Continue hydralazine as needed.    Diabetes mellitus type 2, uncontrolled, with complications - Currently being managed with SSI, moderate scale, every 4 hours and Lantus 5 units daily. - CBGs 114-148.    Tachycardia - Heart rate 105-129. CT chest negative for PE.    Lactic acidosis - Lactate cleared with hydration.    DVT Prophylaxis - Lovenox ordered.  Family Communication: No family at the bedside. Disposition Plan: Home when small bowel obstruction resolved, likely several more days. Code Status:     Code Status Orders        Start     Ordered   04/30/15 1521  Full code   Continuous     04/30/15 1522        IV Access:    Port-A-Cath   Procedures and diagnostic studies:   Dg Abd 1 View  05/04/2015   CLINICAL DATA:  Abdominal distention, follow-up small bowel obstruction  EXAM: ABDOMEN - 1 VIEW  COMPARISON:  Abdominal radiographs dated 05/03/2015. CT abdomen pelvis dated 04/30/2015  FINDINGS: Dilated loops of small bowel in the left mid abdomen, worrisome for small bowel obstruction or possibly adynamic small bowel ileus.  IMPRESSION: Dilated loops of small bowel in the left mid abdomen, worrisome for small bowel obstruction or possible adynamic small bowel ileus.   Electronically Signed   By: Julian Hy M.D.   On: 05/04/2015 09:00   Ct Angio Chest Pe W/cm &/or Wo Cm  04/30/2015   CLINICAL DATA:  59 year old female with a history of shortness of breath and concern for pulmonary emboli.  EXAM: CT ANGIOGRAPHY CHEST WITH CONTRAST  TECHNIQUE: Multidetector CT imaging of the chest was performed using  the standard protocol during bolus administration of intravenous contrast. Multiplanar CT image reconstructions and MIPs were obtained to evaluate the vascular anatomy.  CONTRAST:  177m OMNIPAQUE IOHEXOL 350 MG/ML SOLN  COMPARISON:  Abdominal CT 04/30/2015, PET-CT 12/25/2013, chest CT 11/20/2013  FINDINGS: Chest:   Port catheter on the right chest wall via right IJ approach.  No axillary or supraclavicular adenopathy.  Nodular appearance of the thyroid with hypodense/ hypoenhancing small lesions inferiorly. Nonspecific by CT. Ill-defined low-density soft tissue inferior to the thyroid extending to the aortic arch was present on comparison PET-CT and chest CT, uncertain significance.  Enteric catheter within the esophagus, terminating out of the field of view.  Several mediastinal lymph nodes, none of which are enlarged by CT size criteria or have suspicious features.  Unremarkable appearance of the esophagus.  Unremarkable course caliber and contour of the thoracic aorta without dissection flap, aneurysm, periaortic fluid.  No central, lobar, segmental, or proximal subsegmental filling defects to indicate pulmonary emboli.  Heart size within normal limits without pericardial fluid/ thickening.  Compared to the prior PET-CT and chest CT of 2015, there has been interval development/enlargement of multiple bilateral pulmonary nodules.  The largest is in the right middle lobe measuring 14 mm (image 46). Remainder of the nodules are smaller than this diameter, and include right upper lobe (image 14, 7 mm), left upper lobe (image 30, 6 mm), right lower lobe (image 58, 11 mm) and left lower lobe (image 38, 8 mm, image 40, 5 mm, image 60, 8 mm, image 63, 13 mm)  No confluent airspace disease.  No pneumothorax or pleural effusion.  Upper abdomen:  Hemangioma of the liver.  Additional findings of the abdomen are better characterized on dedicated contrast enhanced CT of today's date. Please refer to this report.  Musculoskeletal:  No displaced fracture identified.  Focal sclerotic lesion at the angle of the right ninth rib, was present on the previous PET-CT.  Review of the MIP images confirms the above findings.  IMPRESSION: Study is negative for pulmonary emboli.  New/enlarging bilateral pulmonary nodules involving all lobes,  compatible with metastatic disease. Largest in the right middle lobe measures 14 mm.  Sclerotic lesion at the angle of the right ninth rib, present on comparison CT of Dec 25, 2013, and could potentially be benign.  Signed,  JDulcy Fanny WEarleen Newport DO  [facility]  [facility]   Electronically Signed   By: JCorrie MckusickD.O.   On: 04/30/2015 17:01   Ct Abdomen Pelvis W Contrast  04/30/2015   CLINICAL DATA:  Abdominal pain, back pain, nausea, vomiting. History of anal cancer. Last chemotherapy treatment in April.  EXAM: CT ABDOMEN AND PELVIS WITH CONTRAST  TECHNIQUE: Multidetector CT imaging of the abdomen and pelvis was performed using the standard protocol following bolus administration of intravenous contrast.  CONTRAST:  237mOMNIPAQUE IOHEXOL 300 MG/ML SOLN, 10052mMNIPAQUE IOHEXOL 300 MG/ML SOLN  COMPARISON:  PET CT 12/25/2013  FINDINGS: Enlarging pulmonary nodules in the lung bases. Partially cavitary nodule in the right lower lobe measures 11 mm on image 9 compared with 3 mm previously. 11 mm nodule in the left lower lobe on image 8 previously measured 5 mm. Second enlarging left lower lobe nodule on image 9 in the posterior left lower lobe. These are most compatible with metastases. Heart is normal size. No pleural effusions.  Liver, gallbladder, spleen, pancreas, adrenals and kidneys are unremarkable.  Left upper quadrant ostomy noted. There are mildly prominent small  bowel loop in s in the pelvis with air-fluid levels. Cannot completely exclude partial early low grade small bowel obstruction in the pelvis. No well-defined transition point.  Small amount of free fluid in the pelvis. Urinary bladder is unremarkable. Prior hysterectomy. Aorta is normal caliber. No adenopathy or free air. No acute bony abnormality or focal bone lesion. Degenerative changes in the lumbar spine.  IMPRESSION: Postoperative changes with left abdominal ostomy small bowel obstruction or  focal ileus.  Small amount of free fluid in the pelvis.  Enlarging bilateral lower lobe pulmonary nodules compatible with metastases.   Electronically Signed   By: Rolm Baptise M.D.   On: 04/30/2015 11:00   Dg Chest Port 1 View  05/04/2015   CLINICAL DATA:  Shortness of breath  EXAM: PORTABLE CHEST 1 VIEW  COMPARISON:  04/30/2015  FINDINGS: Mild bibasilar opacities, likely atelectasis, pneumonia not excluded. No pleural effusion or pneumothorax.  Heart is top-normal in size.  Right chest power port terminating in the lower SVC.  Enteric tube terminating in the gastric cardia.  IMPRESSION: Mild bibasilar opacities, likely atelectasis, pneumonia not excluded.   Electronically Signed   By: Julian Hy M.D.   On: 05/04/2015 10:43   Dg Chest Port 1 View  04/30/2015   CLINICAL DATA:  Port-A-Cath placement, hypertension, diabetes mellitus, anal adenocarcinoma  EXAM: PORTABLE CHEST - 1 VIEW  COMPARISON:  Portable exam 1846 hr compared to CT chest of 04/30/2015  FINDINGS: RIGHT jugular dual-lumen Port-A-Cath with tip projecting over SVC.  Nasogastric tube extends into stomach.  Enlargement of cardiac silhouette.  Mediastinal contours and pulmonary vascularity normal.  Scattered pulmonary nodules again identified.  No gross infiltrate, pleural effusion or pneumothorax.  No acute osseous findings.  IMPRESSION: No pneumothorax following Port-A-Cath placement.  Enlargement of cardiac silhouette.  Pulmonary nodules question metastatic disease   Electronically Signed   By: Lavonia Dana M.D.   On: 04/30/2015 19:36   Dg Abd 2 Views  05/05/2015   CLINICAL DATA:  Mid abdominal discomfort, abdominal distention, with generalized weakness  EXAM: ABDOMEN - 2 VIEW  COMPARISON:  Supine abdominal film dated May 04, 2015  FINDINGS: There remain numerous loops of mildly distended gas-filled small bowel in the mid and upper abdomen. There is only a small amount of colonic gas and stool. There is no rectal gas. No free  extraluminal gas collections are observed. The esophagogastric tube tip and proximal port lie in the stomach. There is mild bibasilar atelectasis and small right pleural effusion. There are mild degenerative changes of the lumbar spine. There are phleboliths within the pelvis.  IMPRESSION: Findings consistent with small bowel ileus or partial mid to distal small bowel obstruction. The degree of gaseous distention has decreased somewhat.   Electronically Signed   By: David  Martinique M.D.   On: 05/05/2015 08:03   Dg Abd 2 Views  05/03/2015   CLINICAL DATA:  Left lower quadrant abdominal pain. Small-bowel obstruction.  EXAM: ABDOMEN - 2 VIEW  COMPARISON:  05/01/2015  FINDINGS: There is no evidence of intraperitoneal free air. There are a few new loops of mildly dilated small bowel in the left upper quadrant. Enteric tube terminates over the proximal stomach. Calcifications in the pelvis likely represent phleboliths.  IMPRESSION: New dilatation of a few small-bowel loops in the left upper quadrant concerning for developing obstruction.   Electronically Signed   By: Logan Bores M.D.   On: 05/03/2015 12:10   Dg Abd Portable 1v  05/04/2015   CLINICAL  DATA:  NG tube placement  EXAM: PORTABLE ABDOMEN - 1 VIEW  COMPARISON:  05/04/2015  FINDINGS: NG tube has been placed and coils in the fundus of the stomach. Continued small bowel distention. No free air organomegaly.  IMPRESSION: NG tube coils in the fundus of the stomach.   Electronically Signed   By: Rolm Baptise M.D.   On: 05/04/2015 10:24   Dg Abd Portable 1v-small Bowel Obstruction Protocol-initial, 8 Hr Delay  05/01/2015   CLINICAL DATA:  Small-bowel protocol.  Contrast given at 11 a.m.  EXAM: PORTABLE ABDOMEN - 1 VIEW  COMPARISON:  05/01/2015 at 7:58 a.m.  FINDINGS: On the previous exam, there is a focus of tubular contrast that project in the right mid abdomen. This is stable.  There is some contrast in the stomach. Some contrast is seen superimposed on the  left colostomy. There is no other contrast evident within the abdomen. It is unclear whether this contrast has passed through the bowel and in removed from a colostomy bag. There is no visible small bowel contrast to suggest significant obstruction. No small bowel dilation is seen.  IMPRESSION: No convincing small bowel obstruction. No small bowel contrast is seen. There is some contrast the left mid abdomen colostomy.   Electronically Signed   By: Lajean Manes M.D.   On: 05/01/2015 19:24   Dg Abd Portable 1v  05/01/2015   CLINICAL DATA:  Followup slow  EXAM: PORTABLE ABDOMEN - 1 VIEW  COMPARISON:  04/30/2015  FINDINGS: Left upper quadrant ostomy is again identified. Scattered large and small bowel gas is noted. No definitive obstructive changes are seen although the degree of fluid distension seen previously may preclude adequate evaluation on plain film. Contrast material is noted within the gallbladder no free air is seen. No abnormal mass is noted. A nasogastric catheter is seen within the stomach.  IMPRESSION: No definitive obstructive changes are noted although the degree of dilated fluid-filled bowel would not be well appreciated on this exam.   Electronically Signed   By: Inez Catalina M.D.   On: 05/01/2015 08:29     Medical Consultants:    None.  Anti-Infectives:   Anti-infectives    Start     Dose/Rate Route Frequency Ordered Stop   05/04/15 1200  vancomycin (VANCOCIN) IVPB 1000 mg/200 mL premix     1,000 mg 200 mL/hr over 60 Minutes Intravenous Every 12 hours 05/04/15 1100     05/02/15 1300  piperacillin-tazobactam (ZOSYN) IVPB 3.375 g     3.375 g 12.5 mL/hr over 240 Minutes Intravenous 3 times per day 05/02/15 1204        Subjective:   Smyer reports crampy LLQ pain.  Feels hungry.  Had a stool in her colostomy bag yesterday.  Denies cough, SOB.  Having trouble sleeping (says was having issues with this prior to coming into the hospital.)  Objective:    Filed  Vitals:   05/04/15 0537 05/04/15 1511 05/04/15 2209 05/05/15 0619  BP: 146/68 166/71 165/85 154/77  Pulse: 129 128 124 105  Temp: 100 F (37.8 C) 99.3 F (37.4 C) 99 F (37.2 C) 98.6 F (37 C)  TempSrc: Oral Oral Oral Oral  Resp: '19 18 19 20  '$ Height:      Weight:      SpO2: 92% 93% 94% 93%    Intake/Output Summary (Last 24 hours) at 05/05/15 0925 Last data filed at 05/05/15 0655  Gross per 24 hour  Intake   1830 ml  Output   2700 ml  Net   -870 ml   Filed Weights   05/02/15 1200  Weight: 74.1 kg (163 lb 5.8 oz)    Exam: Gen:  Restless Cardiovascular:  Tachy, No M/R/G Respiratory:  Lungs CTAB Gastrointestinal:  Abdomen distended, sore to touch, ostomy with small tan formed stool Extremities:  No C/E/C   Data Reviewed:    Labs: Basic Metabolic Panel:  Recent Labs Lab 04/30/15 1735 05/01/15 0654 05/02/15 0440 05/03/15 0545  05/04/15 0525 05/05/15 0505  NA  --  140 144 144  --  145 143  K  --  3.3* 3.5 3.0*  < > 2.8* 3.7  CL  --  103 104 103  --  103 105  CO2  --  27 32 30  --  30 26  GLUCOSE  --  318* 176* 114*  --  76 127*  BUN  --  13 37* 43*  --  26* 14  CREATININE 0.59 0.65 1.77* 1.33*  --  0.88 0.68  CALCIUM  --  9.3 9.0 8.9  --  8.8* 8.7*  MG 1.2*  --   --  1.7  --  2.3  --   < > = values in this interval not displayed. GFR Estimated Creatinine Clearance: 77 mL/min (by C-G formula based on Cr of 0.68). Liver Function Tests:  Recent Labs Lab 04/30/15 0940 05/01/15 0654 05/03/15 0545 05/05/15 0505  AST '24 20 23 19  '$ ALT '22 21 18 18  '$ ALKPHOS 83 67 48 56  BILITOT 0.6 1.1 1.1 1.8*  PROT 7.5 6.8 6.6 6.3*  ALBUMIN 4.0 3.6 3.0* 2.4*    Recent Labs Lab 04/30/15 0940  LIPASE 56*   CBC:  Recent Labs Lab 04/30/15 0940  05/01/15 0654 05/02/15 0440 05/03/15 0545 05/04/15 0525 05/05/15 0505  WBC 12.4*  < > 14.1* 16.8* 8.1 10.6* 14.0*  NEUTROABS 10.8*  --   --   --   --   --   --   HGB 11.6*  < > 12.5 12.9 11.0* 9.8* 9.3*  HCT 35.6*  <  > 39.2 40.3 34.5* 30.4* 28.9*  MCV 76.1*  < > 77.2* 78.3 78.2 78.1 78.1  PLT 235  < > 246 281 257 265 273  < > = values in this interval not displayed. Cardiac Enzymes:  Recent Labs Lab 04/30/15 1735 05/01/15 0044 05/01/15 0654  TROPONINI <0.03 <0.03 <0.03   CBG:  Recent Labs Lab 05/04/15 1742 05/04/15 1952 05/04/15 2348 05/05/15 0354 05/05/15 0816  GLUCAP 130* 132* 114* 131* 100*   Sepsis Labs:  Recent Labs Lab 04/30/15 1141  05/01/15 2000 05/02/15 0440 05/03/15 0545 05/04/15 0525 05/05/15 0505  WBC  --   < >  --  16.8* 8.1 10.6* 14.0*  LATICACIDVEN 3.16*  --  1.6  --   --   --   --   < > = values in this interval not displayed. Microbiology No results found for this or any previous visit (from the past 240 hour(s)).   Medications:   . antiseptic oral rinse  7 mL Mouth Rinse q12n4p  . chlorhexidine  15 mL Mouth Rinse BID  . enoxaparin (LOVENOX) injection  40 mg Subcutaneous Q24H  . hyoscyamine  0.125 mg Sublingual Once  . insulin aspart  0-15 Units Subcutaneous 6 times per day  . insulin glargine  5 Units Subcutaneous QHS  . LORazepam  1 mg Intravenous QHS  . metoprolol  5 mg Intravenous 4  times per day  . piperacillin-tazobactam (ZOSYN)  IV  3.375 g Intravenous 3 times per day  . sodium chloride  3 mL Intravenous Q12H  . vancomycin  1,000 mg Intravenous Q12H   Continuous Infusions: . 0.9 % NaCl with KCl 40 mEq / L 100 mL/hr (05/04/15 2330)    Time spent: 25 minutes.   LOS: 5 days   [place] [facility] Pager [phone]. If unable to reach me by pager, please call my cell phone at [phone].  *Please refer to [email or web address], password [email or web address] to get updated schedule on who will round on this patient, as hospitalists switch teams weekly. If 7PM-7AM, please contact night-coverage at [email or web address], password [email or web address] for any overnight needs.  05/05/2015, 9:25 AM

## 2015-05-06 NOTE — Progress Notes (Signed)
Pt d/c with care link accompanied by daughter and care link to [facility].  Pt is alert, oriented, and stable.

## 2015-08-12 ENCOUNTER — Emergency Department (HOSPITAL_BASED_OUTPATIENT_CLINIC_OR_DEPARTMENT_OTHER): Payer: BC Managed Care – PPO

## 2015-08-12 ENCOUNTER — Encounter (HOSPITAL_BASED_OUTPATIENT_CLINIC_OR_DEPARTMENT_OTHER): Payer: Self-pay | Admitting: *Deleted

## 2015-08-12 ENCOUNTER — Emergency Department (HOSPITAL_BASED_OUTPATIENT_CLINIC_OR_DEPARTMENT_OTHER)
Admission: EM | Admit: 2015-08-12 | Discharge: 2015-08-12 | Disposition: A | Discharge status: Other Acute Inpatient Hospital | Payer: BC Managed Care – PPO | Attending: Emergency Medicine | Admitting: Emergency Medicine

## 2015-08-12 DIAGNOSIS — R5383 Other fatigue: Secondary | ICD-10-CM | POA: Diagnosis not present

## 2015-08-12 DIAGNOSIS — N739 Female pelvic inflammatory disease, unspecified: Secondary | ICD-10-CM

## 2015-08-12 DIAGNOSIS — I10 Essential (primary) hypertension: Secondary | ICD-10-CM | POA: Diagnosis not present

## 2015-08-12 DIAGNOSIS — Z8719 Personal history of other diseases of the digestive system: Secondary | ICD-10-CM | POA: Insufficient documentation

## 2015-08-12 DIAGNOSIS — F419 Anxiety disorder, unspecified: Secondary | ICD-10-CM | POA: Diagnosis not present

## 2015-08-12 DIAGNOSIS — Z859 Personal history of malignant neoplasm, unspecified: Secondary | ICD-10-CM | POA: Insufficient documentation

## 2015-08-12 DIAGNOSIS — Z87891 Personal history of nicotine dependence: Secondary | ICD-10-CM | POA: Insufficient documentation

## 2015-08-12 DIAGNOSIS — R000 Tachycardia, unspecified: Secondary | ICD-10-CM | POA: Diagnosis not present

## 2015-08-12 DIAGNOSIS — Z933 Colostomy status: Secondary | ICD-10-CM | POA: Insufficient documentation

## 2015-08-12 DIAGNOSIS — A419 Sepsis, unspecified organism: Secondary | ICD-10-CM | POA: Insufficient documentation

## 2015-08-12 DIAGNOSIS — Z794 Long term (current) use of insulin: Secondary | ICD-10-CM | POA: Diagnosis not present

## 2015-08-12 DIAGNOSIS — R42 Dizziness and giddiness: Secondary | ICD-10-CM | POA: Diagnosis present

## 2015-08-12 DIAGNOSIS — E119 Type 2 diabetes mellitus without complications: Secondary | ICD-10-CM | POA: Diagnosis not present

## 2015-08-12 DIAGNOSIS — Z79899 Other long term (current) drug therapy: Secondary | ICD-10-CM | POA: Diagnosis not present

## 2015-08-12 DIAGNOSIS — M546 Pain in thoracic spine: Secondary | ICD-10-CM | POA: Diagnosis not present

## 2015-08-12 LAB — I-STAT CG4 LACTIC ACID, ED: LACTIC ACID, VENOUS: 1.81 mmol/L (ref 0.5–2.0)

## 2015-08-12 LAB — URINALYSIS, ROUTINE W REFLEX MICROSCOPIC
BILIRUBIN URINE: NEGATIVE
Glucose, UA: NEGATIVE mg/dL
KETONES UR: NEGATIVE mg/dL
Leukocytes, UA: NEGATIVE
NITRITE: NEGATIVE
PROTEIN: 100 mg/dL — AB
Specific Gravity, Urine: 1.02 (ref 1.005–1.030)
pH: 6 (ref 5.0–8.0)

## 2015-08-12 LAB — COMPREHENSIVE METABOLIC PANEL
ALT: 39 U/L (ref 14–54)
ANION GAP: 7 (ref 5–15)
AST: 22 U/L (ref 15–41)
Albumin: 3 g/dL — ABNORMAL LOW (ref 3.5–5.0)
Alkaline Phosphatase: 131 U/L — ABNORMAL HIGH (ref 38–126)
BUN: 25 mg/dL — ABNORMAL HIGH (ref 6–20)
CALCIUM: 9.2 mg/dL (ref 8.9–10.3)
CHLORIDE: 104 mmol/L (ref 101–111)
CO2: 23 mmol/L (ref 22–32)
Creatinine, Ser: 0.66 mg/dL (ref 0.44–1.00)
Glucose, Bld: 192 mg/dL — ABNORMAL HIGH (ref 65–99)
Potassium: 4 mmol/L (ref 3.5–5.1)
SODIUM: 134 mmol/L — AB (ref 135–145)
Total Bilirubin: 1.5 mg/dL — ABNORMAL HIGH (ref 0.3–1.2)
Total Protein: 7.7 g/dL (ref 6.5–8.1)

## 2015-08-12 LAB — CBC WITH DIFFERENTIAL/PLATELET
BASOS PCT: 0 %
Basophils Absolute: 0 10*3/uL (ref 0.0–0.1)
Eosinophils Absolute: 0 10*3/uL (ref 0.0–0.7)
Eosinophils Relative: 0 %
HEMATOCRIT: 27.8 % — AB (ref 36.0–46.0)
HEMOGLOBIN: 8.5 g/dL — AB (ref 12.0–15.0)
LYMPHS ABS: 0.8 10*3/uL (ref 0.7–4.0)
Lymphocytes Relative: 7 %
MCH: 23.4 pg — ABNORMAL LOW (ref 26.0–34.0)
MCHC: 30.6 g/dL (ref 30.0–36.0)
MCV: 76.4 fL — ABNORMAL LOW (ref 78.0–100.0)
MONOS PCT: 7 %
Monocytes Absolute: 0.8 10*3/uL (ref 0.1–1.0)
NEUTROS ABS: 8.7 10*3/uL — AB (ref 1.7–7.7)
NEUTROS PCT: 86 %
Platelets: 248 10*3/uL (ref 150–400)
RBC: 3.64 MIL/uL — ABNORMAL LOW (ref 3.87–5.11)
RDW: 18.4 % — ABNORMAL HIGH (ref 11.5–15.5)
WBC: 10.2 10*3/uL (ref 4.0–10.5)

## 2015-08-12 LAB — URINE MICROSCOPIC-ADD ON: WBC UA: NONE SEEN WBC/hpf (ref 0–5)

## 2015-08-12 LAB — CBG MONITORING, ED
GLUCOSE-CAPILLARY: 54 mg/dL — AB (ref 65–99)
Glucose-Capillary: 182 mg/dL — ABNORMAL HIGH (ref 65–99)

## 2015-08-12 MED ORDER — PIPERACILLIN-TAZOBACTAM 3.375 G IVPB 30 MIN
3.3750 g | Freq: Once | INTRAVENOUS | Status: AC
  Administered 2015-08-12: 3.375 g via INTRAVENOUS
  Filled 2015-08-12 (×2): qty 50

## 2015-08-12 MED ORDER — LOPERAMIDE HCL 2 MG PO CAPS
4.0000 mg | ORAL_CAPSULE | Freq: Once | ORAL | Status: AC
  Administered 2015-08-12: 4 mg via ORAL
  Filled 2015-08-12: qty 2

## 2015-08-12 MED ORDER — ACETAMINOPHEN 325 MG PO TABS
650.0000 mg | ORAL_TABLET | Freq: Once | ORAL | Status: AC
  Administered 2015-08-12: 650 mg via ORAL

## 2015-08-12 MED ORDER — VANCOMYCIN HCL IN DEXTROSE 750-5 MG/150ML-% IV SOLN
750.0000 mg | Freq: Two times a day (BID) | INTRAVENOUS | Status: DC
  Filled 2015-08-12: qty 150

## 2015-08-12 MED ORDER — SODIUM CHLORIDE 0.9 % IV BOLUS (SEPSIS)
1000.0000 mL | INTRAVENOUS | Status: AC
  Administered 2015-08-12 (×2): 1000 mL via INTRAVENOUS

## 2015-08-12 MED ORDER — DIPHENOXYLATE-ATROPINE 2.5-0.025 MG PO TABS: 2.0000 | ORAL_TABLET | Freq: Four times a day (QID) | ORAL | Status: DC | PRN

## 2015-08-12 MED ORDER — VANCOMYCIN HCL IN DEXTROSE 1-5 GM/200ML-% IV SOLN
1000.0000 mg | Freq: Once | INTRAVENOUS | Status: AC
  Administered 2015-08-12: 1000 mg via INTRAVENOUS
  Filled 2015-08-12: qty 200

## 2015-08-12 MED ORDER — SODIUM CHLORIDE 0.9 % IV BOLUS (SEPSIS)
500.0000 mL | INTRAVENOUS | Status: AC
  Administered 2015-08-12: 500 mL via INTRAVENOUS

## 2015-08-12 MED ORDER — IOHEXOL 350 MG/ML SOLN
100.0000 mL | Freq: Once | INTRAVENOUS | Status: AC | PRN
  Administered 2015-08-12: 100 mL via INTRAVENOUS

## 2015-08-12 MED ORDER — ACETAMINOPHEN 325 MG PO TABS
ORAL_TABLET | ORAL | Status: AC
  Administered 2015-08-12: 650 mg via ORAL
  Filled 2015-08-12: qty 2

## 2015-08-12 MED ORDER — ACETAMINOPHEN 325 MG PO TABS
650.0000 mg | ORAL_TABLET | Freq: Once | ORAL | Status: AC
  Administered 2015-08-12: 650 mg via ORAL
  Filled 2015-08-12: qty 2

## 2015-08-12 MED ORDER — PIPERACILLIN-TAZOBACTAM 3.375 G IVPB: 3.3750 g | Freq: Three times a day (TID) | INTRAVENOUS | Status: DC

## 2015-08-12 MED ORDER — DEXTROSE 50 % IV SOLN
1.0000 | Freq: Once | INTRAVENOUS | Status: AC
  Administered 2015-08-12: 50 mL via INTRAVENOUS
  Filled 2015-08-12: qty 50

## 2015-08-12 MED ORDER — FENTANYL CITRATE (PF) 100 MCG/2ML IJ SOLN: 50.0000 ug | Freq: Once | INTRAMUSCULAR | Status: DC

## 2015-08-12 NOTE — ED Notes (Addendum)
Dizziness today, chills x 2 days.

## 2015-08-12 NOTE — Progress Notes (Deleted)
ANTIBIOTIC CONSULT NOTE - INITIAL  Pharmacy Consult for Vancomycin Indication: rule out sepsis  No Known Allergies  Patient Measurements: Height: '5\' 6"'$  (167.6 cm) Weight: 157 lb (71.215 kg) IBW/kg (Calculated) : 59.3 Adjusted Body Weight:   Vital Signs: Temp: 102.3 F (39.1 C) (01/03 1420) Temp Source: Oral (01/03 1420) BP: 134/66 mmHg (01/03 1421) Pulse Rate: 130 (01/03 1421) Intake/Output from previous day:   Intake/Output from this shift:    Labs:  Recent Labs  08/12/15 1410  WBC 10.2  HGB 8.5*  PLT 248   CrCl cannot be calculated (Patient has no serum creatinine result on file.). No results for input(s): VANCOTROUGH, VANCOPEAK, VANCORANDOM, GENTTROUGH, GENTPEAK, GENTRANDOM, TOBRATROUGH, TOBRAPEAK, TOBRARND, AMIKACINPEAK, AMIKACINTROU, AMIKACIN in the last 72 hours.   Microbiology: No results found for this or any previous visit (from the past 720 hour(s)).  Medical History: Past Medical History  Diagnosis Date  . Hypertension   . Diabetes mellitus without complication ([place])   . Elevated cholesterol   . Hemorrhoids   . Anxiety   . Cancer ([place])   . Status post colostomy [facility])     Medications:   (Not in a hospital admission) Scheduled:   Infusions:  . piperacillin-tazobactam    . sodium chloride 1,000 mL (08/12/15 1432)   Followed by  . sodium chloride    . vancomycin     Assessment: 60yo female with history of HTN, DM2 and colostomy presents to [facility] with chills and dizziness. Pharmacy is consulted to dose vancomycin for suspected sepsis. Tmax 102.3, WBC 10.2, sCr 0.66, LA 1.81.  Goal of Therapy:  Vancomycin trough level 15-20 mcg/ml  Plan:  Vancomycin 1g IV q12h Zosyn 3.375g IV q8h Measure antibiotic drug levels at steady state Follow up culture results, renal function and clinical course  Andrey Cota. Diona Foley, PharmD, [address] Clinical Pharmacist Pager [phone] 08/12/2015,2:44 PM

## 2015-08-12 NOTE — ED Notes (Signed)
Patient transported to radiology department.

## 2015-08-12 NOTE — ED Notes (Signed)
Patient transported to CT 

## 2015-08-12 NOTE — Progress Notes (Signed)
ANTIBIOTIC CONSULT NOTE - INITIAL  Pharmacy Consult for Vancomycin Indication: rule out sepsis  No Known Allergies  Patient Measurements: Height: '5\' 6"'$  (167.6 cm) Weight: 157 lb (71.215 kg) IBW/kg (Calculated) : 59.3  Vital Signs: Temp: 102.3 F (39.1 C) (01/03 1420) Temp Source: Oral (01/03 1420) BP: 134/66 mmHg (01/03 1421) Pulse Rate: 130 (01/03 1421)  Labs:  Recent Labs  08/12/15 1410  WBC 10.2  HGB 8.5*  PLT 248   CrCl cannot be calculated (Patient has no serum creatinine result on file.). No results for input(s): VANCOTROUGH, VANCOPEAK, VANCORANDOM, GENTTROUGH, GENTPEAK, GENTRANDOM, TOBRATROUGH, TOBRAPEAK, TOBRARND, AMIKACINPEAK, AMIKACINTROU, AMIKACIN in the last 72 hours.   Microbiology: No results found for this or any previous visit (from the past 720 hour(s)).  Medical History: Past Medical History  Diagnosis Date  . Hypertension   . Diabetes mellitus without complication ([place])   . Elevated cholesterol   . Hemorrhoids   . Anxiety   . Cancer ([place])   . Status post colostomy [facility])     Assessment: 60 year old female to begin Vancomycin for sepsis Received dose of Zosyn Scr historically stable  Goal of Therapy:  Vancomycin trough level 15-20 mcg/ml  Plan:  Vancomycin 1 gram iv 1 dose now Vancomycin 750 mg iv Q 12 hours Follow up Zosyn continuation Follow up progress, Scr, cultures  Thank you Anette Guarneri, PharmD [phone]  Tad Moore 08/12/2015,2:44 PM

## 2015-08-12 NOTE — ED Notes (Signed)
MD at bedside. 

## 2015-08-12 NOTE — ED Provider Notes (Signed)
CSN: [id number]     Arrival date & time 08/12/15  1313 History   First MD Initiated Contact with Patient 08/12/15 1353     Chief Complaint  Patient presents with  . Chills  . Dizziness     (Consider location/radiation/quality/duration/timing/severity/associated sxs/prior Treatment) HPI Comments: Thursday started having upper back pain on right Daughter tried to massage area, did no thelp, area not tender Chills No fevers at home fever here No cough, no dysuria, no rash/sore throat/abd pain/no rash No HA Tylenol Rectal cancer, last year surgery, chemo PICC line and porta cath Receiving TPN Recent admission for [facility]dominal [facility] percutaneous [facility], had drains removed 12/16    Patient is a 60 y.o. female presenting with dizziness.  Dizziness Associated symptoms: no chest pain, no headaches, no nausea, no shortness of breath and no vomiting  Diarrhea: ostmoy.     Past Medical History  Diagnosis Date  . Hypertension   . Diabetes mellitus without complication ([place])   . Elevated cholesterol   . Hemorrhoids   . Anxiety   . Cancer ([place])   . Status post colostomy [facility])    Past Surgical History  Procedure Laterality Date  . Abdominal hysterectomy    . Laceration repair    . Colonoscopy N/A 10/19/2013    Procedure: COLONOSCOPY;  Surgeon: Leighton Ruff, MD;  Location: [facility] ENDOSCOPY;  Service: Endoscopy;  Laterality: N/A;  . Ileostomy    . Picc line place peripheral ([facility] h[facility])    . Port a cath injection ([facility] hx)     No family history on file. Social History  Substance Use Topics  . Smoking status: Former Smoker -- 0.50 packs/day for 34 years    Types: Cigarettes    Start date: 07/15/1975    Quit date: 05/14/2009  . Smokeless tobacco: Never Used     Comment: quit 5 years ago  . Alcohol Use: No   OB History    No data available     Review of Systems  Constitutional: Positive for fever, chills and fatigue.  HENT: Negative for sore throat.   Eyes:  Negative for visual disturbance.  Respiratory: Negative for cough and shortness of breath.   Cardiovascular: Negative for chest pain.  Gastrointestinal: Negative for nausea, vomiting and abdominal pain. Diarrhea: ostmoy.  Genitourinary: Negative for difficulty urinating.  Musculoskeletal: Positive for back pain (right upper back pain). Negative for neck pain.  Skin: Negative for rash.  Neurological: Positive for dizziness and light-headedness. Negative for syncope and headaches.      Allergies  Review of patient's allergies indicates no known allergies.  Home Medications   Prior to Admission medications   Medication Sig Start Date End Date Taking? Authorizing Provider  antiseptic oral rinse (CPC / CETYLPYRIDINIUM CHLORIDE 0.05%) 0.05 % LIQD solution 7 mLs by Mouth Rinse route 2 times daily at 12 noon and 4 pm. 05/05/15   Venetia Maxon Rama, MD  chlorhexidine (PERIDEX) 0.12 % solution 15 mLs by Mouth Rinse route 2 (two) times daily. 05/05/15   Christina P Rama, MD  enoxaparin (LOVENOX) 40 MG/0.4ML injection Inject 0.4 mLs (40 mg total) into the skin daily. 05/05/15   Venetia Maxon Rama, MD  hydrALAZINE (APRESOLINE) 20 MG/ML injection Inject 0.5 mLs (10 mg total) into the vein every 6 (six) hours as needed (sbp>160). 05/05/15   Venetia Maxon Rama, MD  HYDROmorphone (DILAUDID) 1 MG/ML injection Inject 0.5 mLs (0.5 mg total) into the vein every 4 (four) hours as needed for severe pain. 05/05/15  Venetia Maxon Rama, MD  insulin aspart (NOVOLOG) 100 UNIT/ML injection Inject 0-15 Units into the skin every 4 (four) hours. 05/05/15   Venetia Maxon Rama, MD  insulin glargine (LANTUS) 100 UNIT/ML injection Inject 0.05 mLs (5 Units total) into the skin at bedtime. 05/05/15   Venetia Maxon Rama, MD  levalbuterol (XOPENEX) 0.63 MG/3ML nebulizer solution Take 3 mLs (0.63 mg total) by nebulization every 6 (six) hours as needed for wheezing or shortness of breath. 05/05/15   Christina P Rama, MD  LORazepam (ATIVAN) 2 MG/ML  injection Inject 0.5 mLs (1 mg total) into the vein at bedtime. 05/05/15   Venetia Maxon Rama, MD  metoprolol (LOPRESSOR) 1 MG/ML injection Inject 5 mLs (5 mg total) into the vein every 6 (six) hours. 05/05/15   Christina P Rama, MD  ondansetron (ZOFRAN) 4 MG/2ML SOLN injection Inject 2 mLs (4 mg total) into the vein every 6 (six) hours as needed for nausea. 05/05/15   Christina P Rama, MD  piperacillin-tazobactam (ZOSYN) 3.375 GM/50ML IVPB Inject 50 mLs (3.375 g total) into the vein every 8 (eight) hours. 05/05/15   Venetia Maxon Rama, MD  vancomycin (VANCOCIN) 1 GM/200ML SOLN Inject 200 mLs (1,000 mg total) into the vein every 12 (twelve) hours. 05/05/15   Christina P Rama, MD   BP 112/59 mmHg  Pulse 94  Temp(Src) 100.1 F (37.8 C) (Oral)  Resp 23  Ht '5\' 6"'$  (1.676 m)  Wt 157 lb (71.215 kg)  BMI 25.35 kg/m2  SpO2 99% Physical Exam  Constitutional: She is oriented to person, place, and time. She appears well-developed and well-nourished. No distress.  HENT:  Head: Normocephalic and atraumatic.  Eyes: Conjunctivae and EOM are normal.  Neck: Normal range of motion.  Cardiovascular: Regular rhythm, normal heart sounds and intact distal pulses.  Tachycardia present.  Exam reveals no gallop and no friction rub.   No murmur heard. Pulmonary/Chest: Effort normal and breath sounds normal. No respiratory distress. She has no wheezes. She has no rales.  Abdominal: Soft. She exhibits no distension. There is no tenderness. There is no guarding.  Ostomy in place, no surrounding erythema, healed midline abdominal incision, no surrounding erythema, no tenderness  Genitourinary:  Healing wound from prior percutaneous drain through right buttock, no surrounding erythema, no drainage, no palpable sacral abscess  Musculoskeletal: She exhibits no edema or tenderness.  Neurological: She is alert and oriented to person, place, and time.  Skin: Skin is warm and dry. No rash noted. She is not diaphoretic. No erythema.   Port and picc line without surrounding erythema  Nursing note and vitals reviewed.   ED Course  Procedures (including critical care time) Labs Review Labs Reviewed  COMPREHENSIVE METABOLIC PANEL - Abnormal; Notable for the following:    Sodium 134 (*)    Glucose, Bld 192 (*)    BUN 25 (*)    Albumin 3.0 (*)    Alkaline Phosphatase 131 (*)    Total Bilirubin 1.5 (*)    All other components within normal limits  CBC WITH DIFFERENTIAL/PLATELET - Abnormal; Notable for the following:    RBC 3.64 (*)    Hemoglobin 8.5 (*)    HCT 27.8 (*)    MCV 76.4 (*)    MCH 23.4 (*)    RDW 18.4 (*)    Neutro Abs 8.7 (*)    All other components within normal limits  URINALYSIS, ROUTINE W REFLEX MICROSCOPIC (NOT AT [facility]) - Abnormal; Notable for the following:    Color, Urine  AMBER (*)    Hgb urine dipstick LARGE (*)    Protein, ur 100 (*)    All other components within normal limits  URINE MICROSCOPIC-ADD ON - Abnormal; Notable for the following:    Squamous Epithelial / LPF 0-5 (*)    Bacteria, UA RARE (*)    All other components within normal limits  CULTURE, BLOOD (ROUTINE X 2)  CULTURE, BLOOD (ROUTINE X 2)  URINE CULTURE  I-STAT CG4 LACTIC ACID, ED    Imaging Review Ct Angio Chest Pe W/cm &/or Wo Cm  08/12/2015  CLINICAL DATA:  Sepsis. Cough, dizziness, and chills for 2 days. Tachycardia today. History of hypertension, diabetes, and rectal cancer. EXAM: CT ANGIOGRAPHY CHEST CT ABDOMEN AND PELVIS WITH CONTRAST TECHNIQUE: Multidetector CT imaging of the chest was performed using the standard protocol during bolus administration of intravenous contrast. Multiplanar CT image reconstructions and MIPs were obtained to evaluate the vascular anatomy. Multidetector CT imaging of the abdomen and pelvis was performed using the standard protocol during bolus administration of intravenous contrast. CONTRAST:  165m OMNIPAQUE IOHEXOL 350 MG/ML SOLN COMPARISON:  04/30/2015 FINDINGS: CTA CHEST FINDINGS  Pulmonary arterial opacification is adequate without evidence of emboli, although a right middle lobe metastasis may be involving a subsegmental branch in the lateral segment. A right jugular Port-A-Cath terminates in the lower SVC. A left PICC is also present. The heart is normal in size. Thoracic aorta is normal in caliber. Nodular, heterogeneous thyroid is similar to the prior study. Ill-defined low density soft tissue inferior to the thyroid has also not significantly changed and is indeterminate. There is no pleural or pericardial effusion. Pulmonary nodules have all increased in size in the interim. The largest in the right lung measures 2.8 x 2.5 cm in the middle lobe (series 6, image 51, previously 1.4 cm). There are patchy/nodular opacities more peripheral to this lesion in the right middle lobe which may reflect postobstructive changes or additional tumor. There is at least 1 new lesion elsewhere (3 mm in the right upper lobe on series 6, image 40). For reference, a left lower lobe nodule measures 2.0 cm (series 6, image 62, previously 1.4 cm). Degenerative changes are partially visualized in the lower cervical spine. CT ABDOMEN and PELVIS FINDINGS 2.6 cm hyperenhancing lesion in the right hepatic lobe is unchanged and likely represents a hemangioma. The gallbladder, spleen, adrenal glands, right kidney, and pancreas have an unremarkable enhanced appearance. Subcentimeter low-density lesions in the left kidney are unchanged and too small to fully characterize. Sequelae of prior abdominoperineal are resection are again identified. A left upper quadrant ostomy is again seen, and there is a new left lower quadrant ostomy. There is no evidence of bowel obstruction. Postsurgical changes are also noted in the midline of the abdomen and in the right upper abdominal wall. There is hyperenhancement of the wall of multiple small bowel loops in the left lower quadrant/pelvis. There is also diffuse bladder wall  thickening and hyperenhancement. There is extensive low to intermediate density soft tissue thickening throughout the posterior pelvis and presacral space which is new from the prior study. Centrally within this region of amorphous soft tissue is a rim enhancing fluid collection which measures 2.0 x 1.5 cm (series 8, image 65). Extending posteriorly and laterally from this presacral soft tissue is a small tract which courses through the gluteal region right of the sacrum to communicate with the skin where external bandage material is in place. The uterus is absent. There is a prominent  amount of gas in the vagina. Mild aortic atherosclerosis is noted. Small left mid abdominal mesenteric lymph nodes have slightly increased in size, measuring up to 8 mm in short axis. Small sclerotic focus in the lateral right ninth rib is unchanged. Sclerotic focus in the left ilium is also unchanged. Review of the MIP images confirms the above findings. IMPRESSION: 1. No evidence of pulmonary emboli. 2. Increased size and number of pulmonary metastases. 3. New pelvic/presacral soft tissue which may reflect inflammation and post radiation changes, although underlying tumor is also possible. 4. 2.0 cm abscess within the presacral soft tissue, presumably communicating with a loop of bowel in the pelvis and also with a fistula to the skin in the right gluteal region. A fistula to the vagina is also possible. 5. Diffuse wall enhancement of multiple small bowel loops in the pelvis and of the bladder, consistent with radiation enteritis and radiation cystitis. Electronically Signed   By: Logan Bores M.D.   On: 08/12/2015 16:19   Ct Abdomen Pelvis W Contrast  08/12/2015  CLINICAL DATA:  Sepsis. Cough, dizziness, and chills for 2 days. Tachycardia today. History of hypertension, diabetes, and rectal cancer. EXAM: CT ANGIOGRAPHY CHEST CT ABDOMEN AND PELVIS WITH CONTRAST TECHNIQUE: Multidetector CT imaging of the chest was performed using  the standard protocol during bolus administration of intravenous contrast. Multiplanar CT image reconstructions and MIPs were obtained to evaluate the vascular anatomy. Multidetector CT imaging of the abdomen and pelvis was performed using the standard protocol during bolus administration of intravenous contrast. CONTRAST:  119m OMNIPAQUE IOHEXOL 350 MG/ML SOLN COMPARISON:  04/30/2015 FINDINGS: CTA CHEST FINDINGS Pulmonary arterial opacification is adequate without evidence of emboli, although a right middle lobe metastasis may be involving a subsegmental branch in the lateral segment. A right jugular Port-A-Cath terminates in the lower SVC. A left PICC is also present. The heart is normal in size. Thoracic aorta is normal in caliber. Nodular, heterogeneous thyroid is similar to the prior study. Ill-defined low density soft tissue inferior to the thyroid has also not significantly changed and is indeterminate. There is no pleural or pericardial effusion. Pulmonary nodules have all increased in size in the interim. The largest in the right lung measures 2.8 x 2.5 cm in the middle lobe (series 6, image 51, previously 1.4 cm). There are patchy/nodular opacities more peripheral to this lesion in the right middle lobe which may reflect postobstructive changes or additional tumor. There is at least 1 new lesion elsewhere (3 mm in the right upper lobe on series 6, image 40). For reference, a left lower lobe nodule measures 2.0 cm (series 6, image 62, previously 1.4 cm). Degenerative changes are partially visualized in the lower cervical spine. CT ABDOMEN and PELVIS FINDINGS 2.6 cm hyperenhancing lesion in the right hepatic lobe is unchanged and likely represents a hemangioma. The gallbladder, spleen, adrenal glands, right kidney, and pancreas have an unremarkable enhanced appearance. Subcentimeter low-density lesions in the left kidney are unchanged and too small to fully characterize. Sequelae of prior abdominoperineal  are resection are again identified. A left upper quadrant ostomy is again seen, and there is a new left lower quadrant ostomy. There is no evidence of bowel obstruction. Postsurgical changes are also noted in the midline of the abdomen and in the right upper abdominal wall. There is hyperenhancement of the wall of multiple small bowel loops in the left lower quadrant/pelvis. There is also diffuse bladder wall thickening and hyperenhancement. There is extensive low to intermediate density soft  tissue thickening throughout the posterior pelvis and presacral space which is new from the prior study. Centrally within this region of amorphous soft tissue is a rim enhancing fluid collection which measures 2.0 x 1.5 cm (series 8, image 65). Extending posteriorly and laterally from this presacral soft tissue is a small tract which courses through the gluteal region right of the sacrum to communicate with the skin where external bandage material is in place. The uterus is absent. There is a prominent amount of gas in the vagina. Mild aortic atherosclerosis is noted. Small left mid abdominal mesenteric lymph nodes have slightly increased in size, measuring up to 8 mm in short axis. Small sclerotic focus in the lateral right ninth rib is unchanged. Sclerotic focus in the left ilium is also unchanged. Review of the MIP images confirms the above findings. IMPRESSION: 1. No evidence of pulmonary emboli. 2. Increased size and number of pulmonary metastases. 3. New pelvic/presacral soft tissue which may reflect inflammation and post radiation changes, although underlying tumor is also possible. 4. 2.0 cm abscess within the presacral soft tissue, presumably communicating with a loop of bowel in the pelvis and also with a fistula to the skin in the right gluteal region. A fistula to the vagina is also possible. 5. Diffuse wall enhancement of multiple small bowel loops in the pelvis and of the bladder, consistent with radiation  enteritis and radiation cystitis. Electronically Signed   By: Logan Bores M.D.   On: 08/12/2015 16:19   Dg Chest Port 1 View  08/12/2015  CLINICAL DATA:  Dizziness, fever, chills, rectal cancer EXAM: PORTABLE CHEST 1 VIEW COMPARISON:  04/30/2015 and 05/04/2015 FINDINGS: Cardiomediastinal silhouette is stable. Right IJ Port-A-Cath is unchanged in position. There is a left arm PICC line with tip in SVC. At least 2 pulmonary nodules are noted in right lower lobe the largest measures 2.5 cm. Nodule in left lower lobe measures 2 cm. Findings are highly suspicious for progressing metastatic disease. Further evaluation with CT scan of the chest is recommended. No segmental infiltrate or pulmonary edema. IMPRESSION: At least 2 pulmonary nodules are noted in right lower lobe the largest measures 2.5 cm. Nodule in left lower lobe measures 2 cm. Findings are highly suspicious for progressing metastatic disease. Further evaluation with CT scan of the chest is recommended. Electronically Signed   By: Lahoma Crocker M.D.   On: 08/12/2015 14:27   I have personally reviewed and evaluated these images and lab results as part of my medical decision-making.   EKG Interpretation None      CRITICAL CARE: Sepsis, HR greater than 120, multiple fluid bolus Performed by: Alvino Chapel   Total critical care time: 30 minutes  Critical care time was exclusive of separately billable procedures and treating other patients.  Critical care was necessary to treat or prevent imminent or life-threatening deterioration.  Critical care was time spent personally by me on the following activities: development of treatment plan with patient and/or surrogate as well as nursing, discussions with consultants, evaluation of patient's response to treatment, examination of patient, obtaining history from patient or surrogate, ordering and performing treatments and interventions, ordering and review of laboratory studies, ordering  and review of radiographic studies, pulse oximetry and re-evaluation of patient's condition.   MDM   Final diagnoses:  Sepsis ([place])  Pelvic abscess in female   60 year old female with complex medical history including history of rectal cancer now with metastases to the lungs, ileostomy, history of small bowel ischemia requiring  exploratory laparotomy in September, intra-abdominal abscesses in November requiring percutaneous drainage by IR, with PICC line in place and on TPN, diabetes, hypertension, hyperlipidemia, presents with concern of right upper back pain, and lightheadedness. Patient was found to be febrile to 102.3 on arrival to the emergency department. Given patient's tachycardia and right sided upper back pain with several risk factors for pulmonary embolus, CT PE study was ordered which showed worsening metastases to the lungs, however no evidence of pneumonia or PE. Given patient's history of intra-abdominal abscess with recent removal of percutaneous drains and fever with unclear source a CT abdomen was ordered which showed a 2 cm pelvic abscess.  Possible sources of patient's fever include bacteremia from PICC line, pelvic abscess, port.  BLood cx pending. Urinalysis/CXR do not show signs of pneumonia or UTI. Patient was given vancomycin and Zosyn, and 2-1/2 L of fluid per sepsis protocol. Heart rate and temperature has improved. Lactic acid WNL.  Patient requires admission for further IV antibiotics and care is an evaluation of source of infection.   Given her complicated surgical history as well as persistent pelvic abscess, called Dr. Waynard Edwards of Gen. surgery at [facility]. Dr. recommends transfer to the [place] emergency department for further evaluation and admission to IM or surgery for further care.   Gareth Morgan, MD 08/12/15 [id number]

## 2015-08-13 LAB — URINE CULTURE

## 2015-08-14 ENCOUNTER — Telehealth (HOSPITAL_COMMUNITY): Payer: Self-pay

## 2015-08-14 NOTE — Telephone Encounter (Signed)
Lab calling w/critical.  Informed them per MD note pt transferred to [facility].

## 2015-08-15 LAB — CULTURE, BLOOD (ROUTINE X 2)

## 2015-08-16 ENCOUNTER — Telehealth (HOSPITAL_BASED_OUTPATIENT_CLINIC_OR_DEPARTMENT_OTHER): Payer: Self-pay | Admitting: Emergency Medicine

## 2015-08-16 NOTE — Telephone Encounter (Signed)
Post ED Visit - Positive Culture Follow-up  Culture report reviewed by antimicrobial stewardship pharmacist:  '[]'$  Elenor Quinones, Pharm.D. '[]'$  Heide Guile, Pharm.D., BCPS '[x]'$  Parks Neptune, Pharm.D. '[]'$  Alycia Rossetti, Pharm.D., BCPS '[]'$  [place], Pharm.D., BCPS, AAHIVP '[]'$  Legrand Como, Pharm.D., BCPS, AAHIVP '[]'$  Milus Glazier, Pharm.D. '[]'$  Stephens November, Pharm.D.  Positive blood culture Enterococcus x 2 sets Treated with none, pt was transferred to [place] [facility].  Hazle Nordmann 08/16/2015, 3:13 PM

## 2015-08-17 LAB — CULTURE, BLOOD (ROUTINE X 2)

## 2015-10-11 ENCOUNTER — Encounter (HOSPITAL_BASED_OUTPATIENT_CLINIC_OR_DEPARTMENT_OTHER): Payer: Self-pay

## 2015-10-11 ENCOUNTER — Emergency Department (HOSPITAL_BASED_OUTPATIENT_CLINIC_OR_DEPARTMENT_OTHER): Payer: BC Managed Care – PPO

## 2015-10-11 ENCOUNTER — Emergency Department (HOSPITAL_BASED_OUTPATIENT_CLINIC_OR_DEPARTMENT_OTHER)
Admission: EM | Admit: 2015-10-11 | Discharge: 2015-10-11 | Disposition: A | Discharge status: Home / Self Care | Payer: BC Managed Care – PPO | Attending: Emergency Medicine | Admitting: Emergency Medicine

## 2015-10-11 DIAGNOSIS — Z794 Long term (current) use of insulin: Secondary | ICD-10-CM | POA: Insufficient documentation

## 2015-10-11 DIAGNOSIS — Z79899 Other long term (current) drug therapy: Secondary | ICD-10-CM | POA: Diagnosis not present

## 2015-10-11 DIAGNOSIS — Z87891 Personal history of nicotine dependence: Secondary | ICD-10-CM | POA: Insufficient documentation

## 2015-10-11 DIAGNOSIS — Z792 Long term (current) use of antibiotics: Secondary | ICD-10-CM | POA: Insufficient documentation

## 2015-10-11 DIAGNOSIS — C3490 Malignant neoplasm of unspecified part of unspecified bronchus or lung: Secondary | ICD-10-CM | POA: Insufficient documentation

## 2015-10-11 DIAGNOSIS — Z8659 Personal history of other mental and behavioral disorders: Secondary | ICD-10-CM | POA: Diagnosis not present

## 2015-10-11 DIAGNOSIS — R531 Weakness: Secondary | ICD-10-CM | POA: Diagnosis present

## 2015-10-11 DIAGNOSIS — E860 Dehydration: Secondary | ICD-10-CM | POA: Diagnosis not present

## 2015-10-11 DIAGNOSIS — N289 Disorder of kidney and ureter, unspecified: Secondary | ICD-10-CM | POA: Diagnosis not present

## 2015-10-11 DIAGNOSIS — T451X5A Adverse effect of antineoplastic and immunosuppressive drugs, initial encounter: Secondary | ICD-10-CM | POA: Insufficient documentation

## 2015-10-11 DIAGNOSIS — I10 Essential (primary) hypertension: Secondary | ICD-10-CM | POA: Insufficient documentation

## 2015-10-11 DIAGNOSIS — E119 Type 2 diabetes mellitus without complications: Secondary | ICD-10-CM | POA: Insufficient documentation

## 2015-10-11 DIAGNOSIS — E871 Hypo-osmolality and hyponatremia: Secondary | ICD-10-CM

## 2015-10-11 DIAGNOSIS — Z85040 Personal history of malignant carcinoid tumor of rectum: Secondary | ICD-10-CM | POA: Insufficient documentation

## 2015-10-11 DIAGNOSIS — Z7901 Long term (current) use of anticoagulants: Secondary | ICD-10-CM | POA: Insufficient documentation

## 2015-10-11 DIAGNOSIS — Z9221 Personal history of antineoplastic chemotherapy: Secondary | ICD-10-CM | POA: Insufficient documentation

## 2015-10-11 DIAGNOSIS — D701 Agranulocytosis secondary to cancer chemotherapy: Secondary | ICD-10-CM

## 2015-10-11 DIAGNOSIS — Z8719 Personal history of other diseases of the digestive system: Secondary | ICD-10-CM | POA: Insufficient documentation

## 2015-10-11 LAB — CBC WITH DIFFERENTIAL/PLATELET
Basophils Absolute: 0 10*3/uL (ref 0.0–0.1)
Basophils Relative: 1 %
EOS PCT: 2 %
Eosinophils Absolute: 0 10*3/uL (ref 0.0–0.7)
HEMATOCRIT: 32.4 % — AB (ref 36.0–46.0)
HEMOGLOBIN: 10.8 g/dL — AB (ref 12.0–15.0)
LYMPHS ABS: 1.1 10*3/uL (ref 0.7–4.0)
LYMPHS PCT: 51 %
MCH: 24.9 pg — ABNORMAL LOW (ref 26.0–34.0)
MCHC: 33.3 g/dL (ref 30.0–36.0)
MCV: 74.8 fL — AB (ref 78.0–100.0)
MONOS PCT: 24 %
Monocytes Absolute: 0.5 10*3/uL (ref 0.1–1.0)
NEUTROS ABS: 0.5 10*3/uL — AB (ref 1.7–7.7)
Neutrophils Relative %: 22 %
Platelets: 271 10*3/uL (ref 150–400)
RBC: 4.33 MIL/uL (ref 3.87–5.11)
RDW: 19.2 % — AB (ref 11.5–15.5)
WBC: 2.1 10*3/uL — AB (ref 4.0–10.5)

## 2015-10-11 LAB — BASIC METABOLIC PANEL
ANION GAP: 10 (ref 5–15)
BUN: 31 mg/dL — ABNORMAL HIGH (ref 6–20)
CALCIUM: 10.1 mg/dL (ref 8.9–10.3)
CHLORIDE: 92 mmol/L — AB (ref 101–111)
CO2: 26 mmol/L (ref 22–32)
Creatinine, Ser: 2.05 mg/dL — ABNORMAL HIGH (ref 0.44–1.00)
GFR calc non Af Amer: 25 mL/min — ABNORMAL LOW (ref >60)
GFR, EST AFRICAN AMERICAN: 29 mL/min — AB (ref >60)
GLUCOSE: 123 mg/dL — AB (ref 65–99)
Potassium: 4.5 mmol/L (ref 3.5–5.1)
Sodium: 128 mmol/L — ABNORMAL LOW (ref 135–145)

## 2015-10-11 LAB — URINALYSIS, ROUTINE W REFLEX MICROSCOPIC
BILIRUBIN URINE: NEGATIVE
Glucose, UA: NEGATIVE mg/dL
HGB URINE DIPSTICK: NEGATIVE
KETONES UR: NEGATIVE mg/dL
LEUKOCYTES UA: NEGATIVE
Nitrite: NEGATIVE
PH: 6 (ref 5.0–8.0)
PROTEIN: NEGATIVE mg/dL
SPECIFIC GRAVITY, URINE: 1.004 — AB (ref 1.005–1.030)

## 2015-10-11 MED ORDER — SODIUM CHLORIDE 0.9 % IV BOLUS (SEPSIS)
1000.0000 mL | Freq: Once | INTRAVENOUS | Status: AC
  Administered 2015-10-11: 1000 mL via INTRAVENOUS

## 2015-10-11 NOTE — ED Notes (Signed)
Patient transported to X-ray 

## 2015-10-11 NOTE — Discharge Instructions (Signed)
Your blood count is low, but no sign of infection today. Call your physician immediately if you develop a fever at home.  Your kidney function is abnormal and high, but it is better than it was on February 28. Call your physician for recheck appointment.  Push fluids, stay hydrated.

## 2015-10-11 NOTE — ED Notes (Signed)
Patent here with increased weakness and dizziness with any ambulation for the past few weeks. Receiving chemo for cancer and unable to have chemo this past week due to the fatigue. No vomiting, just feels tired

## 2015-10-11 NOTE — ED Notes (Signed)
Pt made aware of need for urine.

## 2015-10-11 NOTE — ED Notes (Signed)
MD at bedside. 

## 2015-10-11 NOTE — ED Notes (Signed)
Pt reports generalized weakness x 1 week. Unable to get chemo earlier this week. Pt sts she is receiving chemo for lung CA in [place] at this time.

## 2015-10-11 NOTE — ED Provider Notes (Signed)
CSN: [id number]     Arrival date & time 10/11/15  1001 History   First MD Initiated Contact with Patient 10/11/15 1013     Chief Complaint  Patient presents with  . Weakness     HPI  Patient presents for weakness and dizziness. Has a history of rectal cancer status post surgery, radiation, chemotherapy. This ended late last year. Has recurrence with pulmonary nodules and has restarted chemotherapy in February. Get one course in early February. Said she could not get last week because "I was dehydrated". States she is really not been drinking well since then. No nausea or vomiting. Has an ostomy from her previous resection. Has had normal ostomy output. No fevers or chills. No pain. No vertigo symptoms.  Past Medical History  Diagnosis Date  . Hypertension   . Diabetes mellitus without complication ([place])   . Elevated cholesterol   . Hemorrhoids   . Anxiety   . Cancer ([place])   . Status post colostomy [facility])    Past Surgical History  Procedure Laterality Date  . Abdominal hysterectomy    . Laceration repair    . Colonoscopy N/A 10/19/2013    Procedure: COLONOSCOPY;  Surgeon: Leighton Ruff, MD;  Location: [facility];  Service: Endoscopy;  Laterality: N/A;  . Ileostomy    . Picc line place peripheral ([facility] hx)    . Port a cath injection ([facility] hx)     No family history on file. Social History  Substance Use Topics  . Smoking status: Former Smoker -- 0.50 packs/day for 34 years    Types: Cigarettes    Start date: 07/15/1975    Quit date: 05/14/2009  . Smokeless tobacco: Never Used     Comment: quit 5 years ago  . Alcohol Use: No   OB History    No data available     Review of Systems  Constitutional: Negative for fever, chills, diaphoresis, appetite change and fatigue.  HENT: Negative for mouth sores, sore throat and trouble swallowing.   Eyes: Negative for visual disturbance.  Respiratory: Negative for cough, chest tightness, shortness of breath and wheezing.    Cardiovascular: Negative for chest pain.  Gastrointestinal: Negative for nausea, vomiting, abdominal pain, diarrhea and abdominal distention.  Endocrine: Negative for polydipsia, polyphagia and polyuria.  Genitourinary: Negative for dysuria, frequency and hematuria.  Musculoskeletal: Negative for gait problem.  Skin: Negative for color change, pallor and rash.  Neurological: Positive for dizziness and weakness. Negative for syncope, light-headedness and headaches.  Hematological: Does not bruise/bleed easily.  Psychiatric/Behavioral: Negative for behavioral problems and confusion.      Allergies  Review of patient's allergies indicates no known allergies.  Home Medications   Prior to Admission medications   Medication Sig Start Date End Date Taking? Authorizing Provider  levofloxacin (LEVAQUIN) 500 MG tablet Take 500 mg by mouth daily.   Yes Historical Provider, MD  antiseptic oral rinse (CPC / CETYLPYRIDINIUM CHLORIDE 0.05%) 0.05 % LIQD solution 7 mLs by Mouth Rinse route 2 times daily at 12 noon and 4 pm. 05/05/15   Venetia Maxon Rama, MD  chlorhexidine (PERIDEX) 0.12 % solution 15 mLs by Mouth Rinse route 2 (two) times daily. 05/05/15   Christina P Rama, MD  enoxaparin (LOVENOX) 40 MG/0.4ML injection Inject 0.4 mLs (40 mg total) into the skin daily. 05/05/15   Venetia Maxon Rama, MD  hydrALAZINE (APRESOLINE) 20 MG/ML injection Inject 0.5 mLs (10 mg total) into the vein every 6 (six) hours as needed (sbp>160). 05/05/15  Venetia Maxon Rama, MD  HYDROmorphone (DILAUDID) 1 MG/ML injection Inject 0.5 mLs (0.5 mg total) into the vein every 4 (four) hours as needed for severe pain. 05/05/15   Venetia Maxon Rama, MD  insulin aspart (NOVOLOG) 100 UNIT/ML injection Inject 0-15 Units into the skin every 4 (four) hours. 05/05/15   Venetia Maxon Rama, MD  insulin glargine (LANTUS) 100 UNIT/ML injection Inject 0.05 mLs (5 Units total) into the skin at bedtime. 05/05/15   Venetia Maxon Rama, MD  levalbuterol  (XOPENEX) 0.63 MG/3ML nebulizer solution Take 3 mLs (0.63 mg total) by nebulization every 6 (six) hours as needed for wheezing or shortness of breath. 05/05/15   Christina P Rama, MD  LORazepam (ATIVAN) 2 MG/ML injection Inject 0.5 mLs (1 mg total) into the vein at bedtime. 05/05/15   Venetia Maxon Rama, MD  metoprolol (LOPRESSOR) 1 MG/ML injection Inject 5 mLs (5 mg total) into the vein every 6 (six) hours. 05/05/15   Christina P Rama, MD  ondansetron (ZOFRAN) 4 MG/2ML SOLN injection Inject 2 mLs (4 mg total) into the vein every 6 (six) hours as needed for nausea. 05/05/15   Christina P Rama, MD  piperacillin-tazobactam (ZOSYN) 3.375 GM/50ML IVPB Inject 50 mLs (3.375 g total) into the vein every 8 (eight) hours. 05/05/15   Venetia Maxon Rama, MD  vancomycin (VANCOCIN) 1 GM/200ML SOLN Inject 200 mLs (1,000 mg total) into the vein every 12 (twelve) hours. 05/05/15   Christina P Rama, MD   BP 119/68 mmHg  Pulse 88  Temp(Src) 98.5 F (36.9 C) (Oral)  Resp 16  Ht '5\' 6"'$  (1.676 m)  Wt 127 lb 14.4 oz (58.015 kg)  BMI 20.65 kg/m2  SpO2 100% Physical Exam  Constitutional: She is oriented to person, place, and time. She appears well-developed and well-nourished. No distress.  HENT:  Head: Normocephalic.  Sclera anicteric. Conjunctiva not pale. Mucous membranes slightly dry.  Eyes: Conjunctivae are normal. Pupils are equal, round, and reactive to light. No scleral icterus.  Neck: Normal range of motion. Neck supple. No thyromegaly present.  Cardiovascular: Normal rate and regular rhythm.  Exam reveals no gallop and no friction rub.   No murmur heard. Tachycardic. No murmurs. Clear lungs. Sinus rhythm on monitor  Pulmonary/Chest: Effort normal and breath sounds normal. No respiratory distress. She has no wheezes. She has no rales.  Abdominal: Soft. Bowel sounds are normal. She exhibits no distension. There is no tenderness. There is no rebound.  Nontender. Right lower quadrant ostomy site appears normal   Musculoskeletal: Normal range of motion.  Neurological: She is alert and oriented to person, place, and time.  Skin: Skin is warm and dry. No rash noted.  Psychiatric: She has a normal mood and affect. Her behavior is normal.    ED Course  Procedures (including critical care time) Labs Review Labs Reviewed  CBC WITH DIFFERENTIAL/PLATELET - Abnormal; Notable for the following:    WBC 2.1 (*)    Hemoglobin 10.8 (*)    HCT 32.4 (*)    MCV 74.8 (*)    MCH 24.9 (*)    RDW 19.2 (*)    Neutro Abs 0.5 (*)    All other components within normal limits  BASIC METABOLIC PANEL - Abnormal; Notable for the following:    Sodium 128 (*)    Chloride 92 (*)    Glucose, Bld 123 (*)    BUN 31 (*)    Creatinine, Ser 2.05 (*)    GFR calc non Af Amer 25 (*)  GFR calc Af Amer 29 (*)    All other components within normal limits  URINALYSIS, ROUTINE W REFLEX MICROSCOPIC (NOT AT [facility]) - Abnormal; Notable for the following:    Specific Gravity, Urine 1.004 (*)    All other components within normal limits    Imaging Review Dg Chest 2 View  10/11/2015  CLINICAL DATA:  Increased weakness and dizziness with ambulation for the past several weeks, current history of lung cancer and chemotherapy EXAM: CHEST  2 VIEW COMPARISON:  08/12/2015 FINDINGS: Unchanged Port-A-Cath. Heart size and vascular pattern normal. Prior rounded opacities left lower lobe nearly completely resolved. Inferior-most opacity right lower lobe much less conspicuous. Prior 2.6 cm mass right lower lobe remains present but now measures 17 mm. No evidence of infiltrate or effusion. IMPRESSION: Significant interval decrease in size of bilateral pulmonary nodules consistent with response to chemotherapy. No new abnormalities. Electronically Signed   By: Skipper Cliche M.D.   On: 10/11/2015 11:09   I have personally reviewed and evaluated these images and lab results as part of my medical decision-making.   EKG Interpretation None      MDM    Final diagnoses:  Weakness  Dehydration  Hyponatremia  Renal insufficiency  Chemotherapy-induced neutropenia (HCC)    Neutropenic. But no fever, or signs symptoms or findings to suggest infection. No bandemia. Her creatinine is elevated just above 2. However, on the 28th it was 2.9. She is given IV fluids here feels much much better. Mild hyponatremia. I discussed all this with her at length. I would like to avoid hospitalization of this neutropenic patient. She is rehydrated feeling much improved. Given return precautions. Neurovascular contact her oncologist immediately. Worsening symptoms she may follow with her allergist, return here, or to [facility] for primary care has been thus far    Tanna Furry, MD 10/11/15 1341

## 2015-10-25 ENCOUNTER — Encounter (HOSPITAL_BASED_OUTPATIENT_CLINIC_OR_DEPARTMENT_OTHER): Payer: Self-pay

## 2015-10-25 ENCOUNTER — Emergency Department (HOSPITAL_BASED_OUTPATIENT_CLINIC_OR_DEPARTMENT_OTHER)
Admission: EM | Admit: 2015-10-25 | Discharge: 2015-10-25 | Disposition: A | Discharge status: Other Acute Inpatient Hospital | Payer: BC Managed Care – PPO | Attending: Emergency Medicine | Admitting: Emergency Medicine

## 2015-10-25 DIAGNOSIS — E871 Hypo-osmolality and hyponatremia: Secondary | ICD-10-CM | POA: Diagnosis not present

## 2015-10-25 DIAGNOSIS — E119 Type 2 diabetes mellitus without complications: Secondary | ICD-10-CM | POA: Insufficient documentation

## 2015-10-25 DIAGNOSIS — I10 Essential (primary) hypertension: Secondary | ICD-10-CM | POA: Insufficient documentation

## 2015-10-25 DIAGNOSIS — R112 Nausea with vomiting, unspecified: Secondary | ICD-10-CM | POA: Diagnosis present

## 2015-10-25 DIAGNOSIS — Z87891 Personal history of nicotine dependence: Secondary | ICD-10-CM | POA: Diagnosis not present

## 2015-10-25 DIAGNOSIS — N19 Unspecified kidney failure: Secondary | ICD-10-CM

## 2015-10-25 DIAGNOSIS — Z85048 Personal history of other malignant neoplasm of rectum, rectosigmoid junction, and anus: Secondary | ICD-10-CM | POA: Insufficient documentation

## 2015-10-25 DIAGNOSIS — F419 Anxiety disorder, unspecified: Secondary | ICD-10-CM | POA: Diagnosis not present

## 2015-10-25 DIAGNOSIS — Z79899 Other long term (current) drug therapy: Secondary | ICD-10-CM | POA: Insufficient documentation

## 2015-10-25 DIAGNOSIS — R42 Dizziness and giddiness: Secondary | ICD-10-CM | POA: Diagnosis not present

## 2015-10-25 DIAGNOSIS — E860 Dehydration: Secondary | ICD-10-CM | POA: Insufficient documentation

## 2015-10-25 DIAGNOSIS — Z8719 Personal history of other diseases of the digestive system: Secondary | ICD-10-CM | POA: Diagnosis not present

## 2015-10-25 DIAGNOSIS — Z794 Long term (current) use of insulin: Secondary | ICD-10-CM | POA: Insufficient documentation

## 2015-10-25 DIAGNOSIS — N390 Urinary tract infection, site not specified: Secondary | ICD-10-CM | POA: Diagnosis not present

## 2015-10-25 DIAGNOSIS — C799 Secondary malignant neoplasm of unspecified site: Secondary | ICD-10-CM

## 2015-10-25 DIAGNOSIS — Z792 Long term (current) use of antibiotics: Secondary | ICD-10-CM | POA: Diagnosis not present

## 2015-10-25 DIAGNOSIS — Z933 Colostomy status: Secondary | ICD-10-CM | POA: Insufficient documentation

## 2015-10-25 LAB — CBC
HEMATOCRIT: 34.2 % — AB (ref 36.0–46.0)
HEMOGLOBIN: 11.6 g/dL — AB (ref 12.0–15.0)
MCH: 25.2 pg — ABNORMAL LOW (ref 26.0–34.0)
MCHC: 33.9 g/dL (ref 30.0–36.0)
MCV: 74.2 fL — AB (ref 78.0–100.0)
Platelets: 224 10*3/uL (ref 150–400)
RBC: 4.61 MIL/uL (ref 3.87–5.11)
RDW: 19.4 % — AB (ref 11.5–15.5)
WBC: 9.1 10*3/uL (ref 4.0–10.5)

## 2015-10-25 LAB — COMPREHENSIVE METABOLIC PANEL
ALT: 65 U/L — ABNORMAL HIGH (ref 14–54)
ANION GAP: 17 — AB (ref 5–15)
AST: 31 U/L (ref 15–41)
Albumin: 4.1 g/dL (ref 3.5–5.0)
Alkaline Phosphatase: 154 U/L — ABNORMAL HIGH (ref 38–126)
BILIRUBIN TOTAL: 0.8 mg/dL (ref 0.3–1.2)
BUN: 49 mg/dL — AB (ref 6–20)
CHLORIDE: 85 mmol/L — AB (ref 101–111)
CO2: 27 mmol/L (ref 22–32)
Calcium: 9.9 mg/dL (ref 8.9–10.3)
Creatinine, Ser: 4.71 mg/dL — ABNORMAL HIGH (ref 0.44–1.00)
GFR, EST AFRICAN AMERICAN: 11 mL/min — AB (ref >60)
GFR, EST NON AFRICAN AMERICAN: 9 mL/min — AB (ref >60)
Glucose, Bld: 222 mg/dL — ABNORMAL HIGH (ref 65–99)
POTASSIUM: 3.7 mmol/L (ref 3.5–5.1)
Sodium: 129 mmol/L — ABNORMAL LOW (ref 135–145)
TOTAL PROTEIN: 7.7 g/dL (ref 6.5–8.1)

## 2015-10-25 LAB — URINALYSIS, ROUTINE W REFLEX MICROSCOPIC
GLUCOSE, UA: NEGATIVE mg/dL
Hgb urine dipstick: NEGATIVE
KETONES UR: 15 mg/dL — AB
NITRITE: NEGATIVE
PH: 5 (ref 5.0–8.0)
Protein, ur: 100 mg/dL — AB
Specific Gravity, Urine: 1.022 (ref 1.005–1.030)

## 2015-10-25 LAB — URINE MICROSCOPIC-ADD ON

## 2015-10-25 LAB — LIPASE, BLOOD: LIPASE: 26 U/L (ref 11–51)

## 2015-10-25 MED ORDER — DEXTROSE 5 % IV SOLN
1.0000 g | Freq: Once | INTRAVENOUS | Status: AC
  Administered 2015-10-25: 1 g via INTRAVENOUS
  Filled 2015-10-25: qty 10

## 2015-10-25 MED ORDER — SODIUM CHLORIDE 0.9 % IV BOLUS (SEPSIS)
1000.0000 mL | Freq: Once | INTRAVENOUS | Status: AC
  Administered 2015-10-25: 1000 mL via INTRAVENOUS

## 2015-10-25 MED ORDER — ONDANSETRON HCL 4 MG/2ML IJ SOLN
4.0000 mg | Freq: Once | INTRAMUSCULAR | Status: AC | PRN
  Administered 2015-10-25: 4 mg via INTRAVENOUS
  Filled 2015-10-25: qty 2

## 2015-10-25 MED ORDER — SODIUM CHLORIDE 0.9 % IV SOLN: INTRAVENOUS | Status: DC

## 2015-10-25 MED ORDER — SODIUM CHLORIDE 0.9 % IV BOLUS (SEPSIS)
1000.0000 mL | Freq: Once | INTRAVENOUS | Status: AC
Start: 2015-10-25 — End: 2015-10-25
  Administered 2015-10-25: 1000 mL via INTRAVENOUS

## 2015-10-25 NOTE — ED Notes (Signed)
Pt ambulatory to bathroom.  Emptied ileostomy bag.

## 2015-10-25 NOTE — ED Notes (Signed)
Contact [facility] for med surgical bed update.  Notified RN

## 2015-10-25 NOTE — ED Notes (Signed)
Vomiting for two days with dizziness and weakness that started last night, hx of dehydration and cancer patient

## 2015-10-25 NOTE — ED Notes (Signed)
[facility] Contacted for transport

## 2015-10-25 NOTE — ED Provider Notes (Signed)
CSN: [id number]     Arrival date & time 10/25/15  1540 History   First MD Initiated Contact with Patient 10/25/15 [phone]     Chief Complaint  Patient presents with  . Emesis     (Consider location/radiation/quality/duration/timing/severity/associated sxs/prior Treatment) HPI  Pt presenting with c/o nausea and vomiting with dizziness.  Has a history of rectal cancer status post surgery, radiation, chemotherapy. This ended late last year. Has recurrence with pulmonary nodules and has restarted chemotherapy in February. She states she has had 2 chemo treatments.  Emesis x 2 days- nonbloody and nonbilious.  She has been able to keep down some liquids- has not used her home zofran.  She states she felt more dizzy and weak last night and felt she was urinating less.  No fever.  No abdominal pain.  No change in output from ostomy.  There are no other associated systemic symptoms, there are no other alleviating or modifying factors.   Past Medical History  Diagnosis Date  . Hypertension   . Diabetes mellitus without complication ([place])   . Elevated cholesterol   . Hemorrhoids   . Anxiety   . Cancer ([place])   . Status post colostomy [facility])    Past Surgical History  Procedure Laterality Date  . Abdominal hysterectomy    . Laceration repair    . Colonoscopy N/A 10/19/2013    Procedure: COLONOSCOPY;  Surgeon: Leighton Ruff, MD;  Location: [facility];  Service: Endoscopy;  Laterality: N/A;  . Ileostomy    . Picc line place peripheral ([facility] hx)    . Port a cath injection ([facility] hx)     No family history on file. Social History  Substance Use Topics  . Smoking status: Former Smoker -- 0.50 packs/day for 34 years    Types: Cigarettes    Start date: 07/15/1975    Quit date: 05/14/2009  . Smokeless tobacco: Never Used     Comment: quit 5 years ago  . Alcohol Use: No   OB History    No data available     Review of Systems  ROS reviewed and all otherwise negative except for mentioned in  HPI    Allergies  Review of patient's allergies indicates no known allergies.  Home Medications   Prior to Admission medications   Medication Sig Start Date End Date Taking? Authorizing Provider  antiseptic oral rinse (CPC / CETYLPYRIDINIUM CHLORIDE 0.05%) 0.05 % LIQD solution 7 mLs by Mouth Rinse route 2 times daily at 12 noon and 4 pm. 05/05/15   Venetia Maxon Rama, MD  chlorhexidine (PERIDEX) 0.12 % solution 15 mLs by Mouth Rinse route 2 (two) times daily. 05/05/15   Christina P Rama, MD  enoxaparin (LOVENOX) 40 MG/0.4ML injection Inject 0.4 mLs (40 mg total) into the skin daily. 05/05/15   Venetia Maxon Rama, MD  hydrALAZINE (APRESOLINE) 20 MG/ML injection Inject 0.5 mLs (10 mg total) into the vein every 6 (six) hours as needed (sbp>160). 05/05/15   Venetia Maxon Rama, MD  HYDROmorphone (DILAUDID) 1 MG/ML injection Inject 0.5 mLs (0.5 mg total) into the vein every 4 (four) hours as needed for severe pain. 05/05/15   Venetia Maxon Rama, MD  insulin aspart (NOVOLOG) 100 UNIT/ML injection Inject 0-15 Units into the skin every 4 (four) hours. 05/05/15   Venetia Maxon Rama, MD  insulin glargine (LANTUS) 100 UNIT/ML injection Inject 0.05 mLs (5 Units total) into the skin at bedtime. 05/05/15   Venetia Maxon Rama, MD  levalbuterol Penne Lash) 0.63 MG/3ML nebulizer  solution Take 3 mLs (0.63 mg total) by nebulization every 6 (six) hours as needed for wheezing or shortness of breath. 05/05/15   Venetia Maxon Rama, MD  levofloxacin (LEVAQUIN) 500 MG tablet Take 500 mg by mouth daily.    Historical Provider, MD  LORazepam (ATIVAN) 2 MG/ML injection Inject 0.5 mLs (1 mg total) into the vein at bedtime. 05/05/15   Venetia Maxon Rama, MD  metoprolol (LOPRESSOR) 1 MG/ML injection Inject 5 mLs (5 mg total) into the vein every 6 (six) hours. 05/05/15   Christina P Rama, MD  ondansetron (ZOFRAN) 4 MG/2ML SOLN injection Inject 2 mLs (4 mg total) into the vein every 6 (six) hours as needed for nausea. 05/05/15   Christina P Rama, MD   piperacillin-tazobactam (ZOSYN) 3.375 GM/50ML IVPB Inject 50 mLs (3.375 g total) into the vein every 8 (eight) hours. 05/05/15   Venetia Maxon Rama, MD  vancomycin (VANCOCIN) 1 GM/200ML SOLN Inject 200 mLs (1,000 mg total) into the vein every 12 (twelve) hours. 05/05/15   Christina P Rama, MD   BP 100/64 mmHg  Pulse 88  Temp(Src) 97.9 F (36.6 C) (Oral)  Resp 18  Ht '5\' 6"'$  (1.676 m)  Wt 125 lb (56.7 kg)  BMI 20.19 kg/m2  SpO2 100%  Vitals reviewed Physical Exam  Physical Examination: General appearance - alert, well appearing, and in no distress Mental status - alert, oriented to person, place, and time Eyes - no conjunctival injection no scleral icterus Mouth - mucous membranes moist, pharynx normal without lesions Chest - clear to auscultation, no wheezes, rales or rhonchi, symmetric air entry, port in right chest wall Heart - normal rate, regular rhythm, normal S1, S2, no murmurs, rubs, clicks or gallops Abdomen - soft, nontender, nondistended, no masses or organomegaly, ostomy bag in place Neurological - alert, oriented, normal speech Extremities - peripheral pulses normal, no pedal edema, no clubbing or cyanosis Skin - normal coloration and turgor, no rashes  ED Course  Procedures (including critical care time) Labs Review Labs Reviewed  COMPREHENSIVE METABOLIC PANEL - Abnormal; Notable for the following:    Sodium 129 (*)    Chloride 85 (*)    Glucose, Bld 222 (*)    BUN 49 (*)    Creatinine, Ser 4.71 (*)    ALT 65 (*)    Alkaline Phosphatase 154 (*)    GFR calc non Af Amer 9 (*)    GFR calc Af Amer 11 (*)    Anion gap 17 (*)    All other components within normal limits  CBC - Abnormal; Notable for the following:    Hemoglobin 11.6 (*)    HCT 34.2 (*)    MCV 74.2 (*)    MCH 25.2 (*)    RDW 19.4 (*)    All other components within normal limits  URINALYSIS, ROUTINE W REFLEX MICROSCOPIC (NOT AT [facility]) - Abnormal; Notable for the following:    Color, Urine AMBER (*)     APPearance TURBID (*)    Bilirubin Urine MODERATE (*)    Ketones, ur 15 (*)    Protein, ur 100 (*)    Leukocytes, UA MODERATE (*)    All other components within normal limits  URINE MICROSCOPIC-ADD ON - Abnormal; Notable for the following:    Squamous Epithelial / LPF 0-5 (*)    Bacteria, UA MANY (*)    Casts HYALINE CASTS (*)    All other components within normal limits  LIPASE, BLOOD    Imaging Review  No results found. I have personally reviewed and evaluated these images and lab results as part of my medical decision-making.   EKG Interpretation None     ED ECG REPORT   Date: 10/25/2015  Rate: 120  Rhythm: sinus tachycardia  QRS Axis: right  Intervals: normal  ST/T Wave abnormalities: nonspecific T wave changes  Conduction Disutrbances:none  Narrative Interpretation:   Old EKG Reviewed: none available  MDM   Final diagnoses:  Renal failure  Dehydration  Hyponatremia  UTI (lower urinary tract infection)  Metastatic cancer ([place])    Pt presenting with c/o dizziness, vomiting- she has hx of rectal cancer with mets and is currently undergoing chemotherapy.  Pt appears moderately dehydrated initial HR 128.  Maintaining blood pressure well.  Labs, urine obtained - pt treated with nausea meds, IV hydration.  Pt found to be in renal failure, also has UTI.  Potassium is normal.  Discussed findings with patient and she is agreeable with plan for admission.    8:28 AM d/w Dr. Shirleen Schirmer, oncology at [facility], pt accepted there for inpatient management.  They will call back with bed assignment  Alfonzo Beers, MD 10/25/15 [phone]

## 2015-11-15 ENCOUNTER — Emergency Department (HOSPITAL_BASED_OUTPATIENT_CLINIC_OR_DEPARTMENT_OTHER): Payer: BC Managed Care – PPO

## 2015-11-15 ENCOUNTER — Emergency Department (HOSPITAL_BASED_OUTPATIENT_CLINIC_OR_DEPARTMENT_OTHER)
Admission: EM | Admit: 2015-11-15 | Discharge: 2015-11-16 | Disposition: A | Discharge status: Home / Self Care | Payer: BC Managed Care – PPO | Attending: Emergency Medicine | Admitting: Emergency Medicine

## 2015-11-15 DIAGNOSIS — F419 Anxiety disorder, unspecified: Secondary | ICD-10-CM | POA: Diagnosis not present

## 2015-11-15 DIAGNOSIS — Z87891 Personal history of nicotine dependence: Secondary | ICD-10-CM | POA: Insufficient documentation

## 2015-11-15 DIAGNOSIS — E860 Dehydration: Secondary | ICD-10-CM | POA: Diagnosis not present

## 2015-11-15 DIAGNOSIS — Z7901 Long term (current) use of anticoagulants: Secondary | ICD-10-CM | POA: Insufficient documentation

## 2015-11-15 DIAGNOSIS — Z8719 Personal history of other diseases of the digestive system: Secondary | ICD-10-CM | POA: Diagnosis not present

## 2015-11-15 DIAGNOSIS — Z85118 Personal history of other malignant neoplasm of bronchus and lung: Secondary | ICD-10-CM | POA: Diagnosis not present

## 2015-11-15 DIAGNOSIS — Z79899 Other long term (current) drug therapy: Secondary | ICD-10-CM | POA: Diagnosis not present

## 2015-11-15 DIAGNOSIS — I10 Essential (primary) hypertension: Secondary | ICD-10-CM | POA: Diagnosis not present

## 2015-11-15 DIAGNOSIS — Z923 Personal history of irradiation: Secondary | ICD-10-CM | POA: Insufficient documentation

## 2015-11-15 DIAGNOSIS — R42 Dizziness and giddiness: Secondary | ICD-10-CM | POA: Diagnosis not present

## 2015-11-15 DIAGNOSIS — Z85048 Personal history of other malignant neoplasm of rectum, rectosigmoid junction, and anus: Secondary | ICD-10-CM | POA: Insufficient documentation

## 2015-11-15 DIAGNOSIS — Z792 Long term (current) use of antibiotics: Secondary | ICD-10-CM | POA: Insufficient documentation

## 2015-11-15 DIAGNOSIS — Z9221 Personal history of antineoplastic chemotherapy: Secondary | ICD-10-CM | POA: Diagnosis not present

## 2015-11-15 DIAGNOSIS — Z794 Long term (current) use of insulin: Secondary | ICD-10-CM | POA: Insufficient documentation

## 2015-11-15 DIAGNOSIS — E119 Type 2 diabetes mellitus without complications: Secondary | ICD-10-CM | POA: Diagnosis not present

## 2015-11-15 LAB — CBC WITH DIFFERENTIAL/PLATELET
BAND NEUTROPHILS: 6 %
BASOS ABS: 0 10*3/uL (ref 0.0–0.1)
Basophils Relative: 0 %
Eosinophils Absolute: 0.1 10*3/uL (ref 0.0–0.7)
Eosinophils Relative: 2 %
HEMATOCRIT: 31.2 % — AB (ref 36.0–46.0)
HEMOGLOBIN: 10.4 g/dL — AB (ref 12.0–15.0)
LYMPHS ABS: 1.2 10*3/uL (ref 0.7–4.0)
Lymphocytes Relative: 24 %
MCH: 26.9 pg (ref 26.0–34.0)
MCHC: 33.3 g/dL (ref 30.0–36.0)
MCV: 80.6 fL (ref 78.0–100.0)
MONOS PCT: 9 %
MYELOCYTES: 1 %
Monocytes Absolute: 0.4 10*3/uL (ref 0.1–1.0)
NEUTROS ABS: 3.1 10*3/uL (ref 1.7–7.7)
Neutrophils Relative %: 58 %
Platelets: 275 10*3/uL (ref 150–400)
RBC: 3.87 MIL/uL (ref 3.87–5.11)
RDW: 22.2 % — AB (ref 11.5–15.5)
WBC: 4.8 10*3/uL (ref 4.0–10.5)
nRBC: 1 /100 WBC — ABNORMAL HIGH

## 2015-11-15 LAB — COMPREHENSIVE METABOLIC PANEL
ALBUMIN: 4 g/dL (ref 3.5–5.0)
ALK PHOS: 229 U/L — AB (ref 38–126)
ALT: 34 U/L (ref 14–54)
ANION GAP: 13 (ref 5–15)
AST: 23 U/L (ref 15–41)
BILIRUBIN TOTAL: 0.9 mg/dL (ref 0.3–1.2)
BUN: 33 mg/dL — AB (ref 6–20)
CO2: 25 mmol/L (ref 22–32)
Calcium: 9.6 mg/dL (ref 8.9–10.3)
Chloride: 91 mmol/L — ABNORMAL LOW (ref 101–111)
Creatinine, Ser: 4.12 mg/dL — ABNORMAL HIGH (ref 0.44–1.00)
GFR, EST AFRICAN AMERICAN: 13 mL/min — AB (ref >60)
GFR, EST NON AFRICAN AMERICAN: 11 mL/min — AB (ref >60)
Glucose, Bld: 195 mg/dL — ABNORMAL HIGH (ref 65–99)
POTASSIUM: 4.6 mmol/L (ref 3.5–5.1)
Sodium: 129 mmol/L — ABNORMAL LOW (ref 135–145)
TOTAL PROTEIN: 7.2 g/dL (ref 6.5–8.1)

## 2015-11-15 MED ORDER — SODIUM CHLORIDE 0.9 % IV BOLUS (SEPSIS)
1000.0000 mL | Freq: Once | INTRAVENOUS | Status: AC
  Administered 2015-11-15: 1000 mL via INTRAVENOUS

## 2015-11-15 MED ORDER — ONDANSETRON HCL 4 MG/2ML IJ SOLN: 4.0000 mg | Freq: Once | INTRAMUSCULAR | Status: DC

## 2015-11-15 NOTE — ED Notes (Signed)
Went to perform EKG earlier, but patient was unavailable.

## 2015-11-15 NOTE — ED Provider Notes (Signed)
CSN: [id number]     Arrival date & time 11/15/15  1928 History  By signing my name below, I, Altamease Oiler, attest that this documentation has been prepared under the direction and in the presence of Deno Etienne, DO. Electronically Signed: Altamease Oiler, ED Scribe. 11/15/2015. 8:24 PM   Chief Complaint  Patient presents with  . Dizziness   The history is provided by the patient. No language interpreter was used.   Barbara Matthews is a 60 y.o. female with history of HTN, DM, recurrent and rectal cancer s/p chemotherapy and radiation and lung cancer who presents to the Emergency Department complaining of intermittent  lightheadedness with onset yesterday. Associated symptoms include nausea ans dry heaving. Additionally her urine has been dark in color.  Pt denies fever, chills, cough, congestion, vomiting, abdominal pain, dysuria, wounds. She last had chemotherapy 2 weeks ago at [facility].  Past Medical History  Diagnosis Date  . Hypertension   . Diabetes mellitus without complication ([place])   . Elevated cholesterol   . Hemorrhoids   . Anxiety   . Cancer ([place])   . Status post colostomy [facility])    Past Surgical History  Procedure Laterality Date  . Abdominal hysterectomy    . Laceration repair    . Colonoscopy N/A 10/19/2013    Procedure: COLONOSCOPY;  Surgeon: Leighton Ruff, MD;  Location: [facility] ENDOSCOPY;  Service: Endoscopy;  Laterality: N/A;  . Ileostomy    . Picc line place peripheral ([facility] hx)    . Port a cath injection ([facility] hx)     No family history on file. Social History  Substance Use Topics  . Smoking status: Former Smoker -- 0.50 packs/day for 34 years    Types: Cigarettes    Start date: 07/15/1975    Quit date: 05/14/2009  . Smokeless tobacco: Never Used     Comment: quit 5 years ago  . Alcohol Use: No   OB History    No data available     Review of Systems  Constitutional: Negative for fever and chills.  HENT: Negative for congestion and rhinorrhea.   Eyes:  Negative for redness and visual disturbance.  Respiratory: Negative for shortness of breath and wheezing.   Cardiovascular: Negative for chest pain and palpitations.  Gastrointestinal: Positive for nausea. Negative for vomiting and abdominal pain.  Genitourinary: Negative for dysuria and urgency.       Dark colored urine   Musculoskeletal: Negative for myalgias and arthralgias.  Skin: Negative for pallor and wound.  Neurological: Positive for light-headedness. Negative for dizziness and headaches.   Allergies  Review of patient's allergies indicates no known allergies.  Home Medications   Prior to Admission medications   Medication Sig Start Date End Date Taking? Authorizing Provider  antiseptic oral rinse (CPC / CETYLPYRIDINIUM CHLORIDE 0.05%) 0.05 % LIQD solution 7 mLs by Mouth Rinse route 2 times daily at 12 noon and 4 pm. 05/05/15   Venetia Maxon Rama, MD  chlorhexidine (PERIDEX) 0.12 % solution 15 mLs by Mouth Rinse route 2 (two) times daily. 05/05/15   Christina P Rama, MD  enoxaparin (LOVENOX) 40 MG/0.4ML injection Inject 0.4 mLs (40 mg total) into the skin daily. 05/05/15   Venetia Maxon Rama, MD  hydrALAZINE (APRESOLINE) 20 MG/ML injection Inject 0.5 mLs (10 mg total) into the vein every 6 (six) hours as needed (sbp>160). 05/05/15   Venetia Maxon Rama, MD  HYDROmorphone (DILAUDID) 1 MG/ML injection Inject 0.5 mLs (0.5 mg total) into the vein every 4 (four) hours  as needed for severe pain. 05/05/15   Venetia Maxon Rama, MD  insulin aspart (NOVOLOG) 100 UNIT/ML injection Inject 0-15 Units into the skin every 4 (four) hours. 05/05/15   Venetia Maxon Rama, MD  insulin glargine (LANTUS) 100 UNIT/ML injection Inject 0.05 mLs (5 Units total) into the skin at bedtime. 05/05/15   Venetia Maxon Rama, MD  levalbuterol (XOPENEX) 0.63 MG/3ML nebulizer solution Take 3 mLs (0.63 mg total) by nebulization every 6 (six) hours as needed for wheezing or shortness of breath. 05/05/15   Venetia Maxon Rama, MD  levofloxacin  (LEVAQUIN) 500 MG tablet Take 500 mg by mouth daily.    Historical Provider, MD  LORazepam (ATIVAN) 2 MG/ML injection Inject 0.5 mLs (1 mg total) into the vein at bedtime. 05/05/15   Venetia Maxon Rama, MD  metoprolol (LOPRESSOR) 1 MG/ML injection Inject 5 mLs (5 mg total) into the vein every 6 (six) hours. 05/05/15   Christina P Rama, MD  ondansetron (ZOFRAN) 4 MG/2ML SOLN injection Inject 2 mLs (4 mg total) into the vein every 6 (six) hours as needed for nausea. 05/05/15   Christina P Rama, MD  piperacillin-tazobactam (ZOSYN) 3.375 GM/50ML IVPB Inject 50 mLs (3.375 g total) into the vein every 8 (eight) hours. 05/05/15   Venetia Maxon Rama, MD  vancomycin (VANCOCIN) 1 GM/200ML SOLN Inject 200 mLs (1,000 mg total) into the vein every 12 (twelve) hours. 05/05/15   Christina P Rama, MD   BP 85/65 mmHg  Pulse 126  Temp(Src) 98.8 F (37.1 C) (Oral)  Resp 18  SpO2 100% Physical Exam  Constitutional: She is oriented to person, place, and time. She appears well-developed and well-nourished. She appears cachectic. No distress.  HENT:  Head: Normocephalic and atraumatic.  Mouth/Throat: Mucous membranes are dry (slightly ).  Eyes: EOM are normal. Pupils are equal, round, and reactive to light.  Neck: Normal range of motion. Neck supple.  Cardiovascular: Normal rate and regular rhythm.  Exam reveals no gallop and no friction rub.   No murmur heard. Pulmonary/Chest: Effort normal. She has no wheezes. She has no rales.  Port at right chest with no tenderness or erythema  CTAB  Abdominal: Soft. She exhibits no distension. There is no tenderness. There is no rebound and no guarding.  Musculoskeletal: She exhibits no edema or tenderness.  Neurological: She is alert and oriented to person, place, and time.  Skin: Skin is warm and dry. She is not diaphoretic.  Psychiatric: She has a normal mood and affect. Her behavior is normal.  Nursing note and vitals reviewed.   ED Course  Procedures (including critical  care time) DIAGNOSTIC STUDIES: Oxygen Saturation is 100% on RA,  normal by my interpretation.    COORDINATION OF CARE: 8:19 PM Discussed treatment plan which includes lab work, CXR, EKG, and IVF with pt at bedside and pt agreed to plan.  Labs Review Labs Reviewed  CBC WITH DIFFERENTIAL/PLATELET - Abnormal; Notable for the following:    Hemoglobin 10.4 (*)    HCT 31.2 (*)    RDW 22.2 (*)    nRBC 1 (*)    All other components within normal limits  COMPREHENSIVE METABOLIC PANEL - Abnormal; Notable for the following:    Sodium 129 (*)    Chloride 91 (*)    Glucose, Bld 195 (*)    BUN 33 (*)    Creatinine, Ser 4.12 (*)    Alkaline Phosphatase 229 (*)    GFR calc non Af Amer 11 (*)  GFR calc Af Amer 13 (*)    All other components within normal limits  URINALYSIS, ROUTINE W REFLEX MICROSCOPIC (NOT AT [facility])    Imaging Review Dg Chest 2 View  11/15/2015  CLINICAL DATA:  Decreased urine output since yesterday, vomiting x2. History of diabetes, hypertension, colon/lung cancer. EXAM: CHEST  2 VIEW COMPARISON:  Chest x-rays dated 10/11/2015 and 08/12/2015. Comparison also made to chest CT dated 08/12/2015. FINDINGS: Small nodular masslike consolidation persists within the right perihilar lung, slightly decreased in size compared to the previous exam of 10/11/2015, significantly decreased in size compared to the earlier chest x-ray of 08/12/2015. Overall, lungs appear much improved compared to the earlier chest x-ray of 08/12/2015. No new lung findings. No pleural effusion or pneumothorax seen. Heart size is normal. Overall cardiomediastinal silhouette is stable in size and configuration. Right chest wall Port-A-Cath remains in place with tip stable in position at the level of the lower SVC. Osseous structures about the chest are unremarkable. IMPRESSION: Interval improvement compared to the chest x-rays of 10/11/2015 and 08/12/2015, as detailed above. No new lung findings. No evidence of pneumonia  or pleural effusion. Heart size remains normal. Electronically Signed   By: Franki Cabot M.D.   On: 11/15/2015 21:07   I have personally reviewed and evaluated these images and lab results as part of my medical decision-making.   EKG Interpretation   Date/Time:  Saturday November 15 2015 20:36:20 EDT Ventricular Rate:  102 PR Interval:  190 QRS Duration: 85 QT Interval:  360 QTC Calculation: 469 R Axis:   53 Text Interpretation:  Sinus tachycardia No significant change since last  tracing Reconfirmed by Ashima Shrake MD, Quillian Quince [phone]) on 11/16/2015 3:53:18 PM       MDM   Final diagnoses:  Light headed  Dehydration    60 yo F With a chief complaint of feeling lightheaded. The going on for the past couple days. Worse when she stands up. Patient denies any fevers but has had some chills. Had a couple episodes of vomiting earlier today. Nonbilious nonbloody. Denies any diarrhea. Patient appears clinically dehydrated. Given IV fluid bolus with significant improvement of vital signs. Chest x-ray is negative for pneumonia. Awaiting UA. Turned over to Dr. Marisue Humble.  I personally performed the services described in this documentation, which was scribed in my presence. The recorded information has been reviewed and is accurate.   The patients results and plan were reviewed and discussed.   Any x-rays performed were independently reviewed by myself.   Differential diagnosis were considered with the presenting HPI.  Medications  sodium chloride 0.9 % bolus 1,000 mL (0 mLs Intravenous Stopped 11/15/15 2314)  sodium chloride 0.9 % bolus 1,000 mL (0 mLs Intravenous Stopped 11/16/15 0051)    Filed Vitals:   11/16/15 0030 11/16/15 0050 11/16/15 0100 11/16/15 0130  BP: 97/56  99/63 100/59  Pulse: 85 90 86 84  Temp:  98.1 F (36.7 C)    TempSrc:  Oral    Resp: '13 21 13 6  '$ SpO2: 100% 100% 100% 100%    Final diagnoses:  Light headed  Dehydration      Deno Etienne, DO 11/16/15 1553

## 2015-11-15 NOTE — ED Notes (Signed)
Pt has hx of lung CA. Decreased urine output since yest. Voided x 1 then, then twice today. Vomited x 2. Dry heaves since.

## 2015-11-16 LAB — URINALYSIS, ROUTINE W REFLEX MICROSCOPIC
GLUCOSE, UA: NEGATIVE mg/dL
Hgb urine dipstick: NEGATIVE
KETONES UR: NEGATIVE mg/dL
LEUKOCYTES UA: NEGATIVE
Nitrite: NEGATIVE
PH: 5.5 (ref 5.0–8.0)
Protein, ur: 30 mg/dL — AB
SPECIFIC GRAVITY, URINE: 1.013 (ref 1.005–1.030)

## 2015-11-16 LAB — URINE MICROSCOPIC-ADD ON: WBC UA: NONE SEEN WBC/hpf (ref 0–5)

## 2015-11-16 NOTE — ED Provider Notes (Signed)
Nursing notes and vitals signs, including pulse oximetry, reviewed.  Summary of this visit's results, reviewed by myself:  Labs:  Results for orders placed or performed during the hospital encounter of 11/15/15 (from the past 24 hour(s))  CBC with Differential     Status: Abnormal   Collection Time: 11/15/15  8:05 PM  Result Value Ref Range   WBC 4.8 4.0 - 10.5 K/uL   RBC 3.87 3.87 - 5.11 MIL/uL   Hemoglobin 10.4 (L) 12.0 - 15.0 g/dL   HCT 31.2 (L) 36.0 - 46.0 %   MCV 80.6 78.0 - 100.0 fL   MCH 26.9 26.0 - 34.0 pg   MCHC 33.3 30.0 - 36.0 g/dL   RDW 22.2 (H) 11.5 - 15.5 %   Platelets 275 150 - 400 K/uL   Neutrophils Relative % 58 %   Lymphocytes Relative 24 %   Monocytes Relative 9 %   Eosinophils Relative 2 %   Basophils Relative 0 %   Band Neutrophils 6 %   Myelocytes 1 %   nRBC 1 (H) 0 /100 WBC   Neutro Abs 3.1 1.7 - 7.7 K/uL   Lymphs Abs 1.2 0.7 - 4.0 K/uL   Monocytes Absolute 0.4 0.1 - 1.0 K/uL   Eosinophils Absolute 0.1 0.0 - 0.7 K/uL   Basophils Absolute 0.0 0.0 - 0.1 K/uL   RBC Morphology POLYCHROMASIA PRESENT    WBC Morphology TOXIC GRANULATION   Comprehensive metabolic panel     Status: Abnormal   Collection Time: 11/15/15  9:10 PM  Result Value Ref Range   Sodium 129 (L) 135 - 145 mmol/L   Potassium 4.6 3.5 - 5.1 mmol/L   Chloride 91 (L) 101 - 111 mmol/L   CO2 25 22 - 32 mmol/L   Glucose, Bld 195 (H) 65 - 99 mg/dL   BUN 33 (H) 6 - 20 mg/dL   Creatinine, Ser 4.12 (H) 0.44 - 1.00 mg/dL   Calcium 9.6 8.9 - 10.3 mg/dL   Total Protein 7.2 6.5 - 8.1 g/dL   Albumin 4.0 3.5 - 5.0 g/dL   AST 23 15 - 41 U/L   ALT 34 14 - 54 U/L   Alkaline Phosphatase 229 (H) 38 - 126 U/L   Total Bilirubin 0.9 0.3 - 1.2 mg/dL   GFR calc non Af Amer 11 (L) >60 mL/min   GFR calc Af Amer 13 (L) >60 mL/min   Anion gap 13 5 - 15  Urinalysis, Routine w reflex microscopic (not at [facility])     Status: Abnormal   Collection Time: 11/16/15 12:45 AM  Result Value Ref Range   Color, Urine  AMBER (A) YELLOW   APPearance CLOUDY (A) CLEAR   Specific Gravity, Urine 1.013 1.005 - 1.030   pH 5.5 5.0 - 8.0   Glucose, UA NEGATIVE NEGATIVE mg/dL   Hgb urine dipstick NEGATIVE NEGATIVE   Bilirubin Urine SMALL (A) NEGATIVE   Ketones, ur NEGATIVE NEGATIVE mg/dL   Protein, ur 30 (A) NEGATIVE mg/dL   Nitrite NEGATIVE NEGATIVE   Leukocytes, UA NEGATIVE NEGATIVE  Urine microscopic-add on     Status: Abnormal   Collection Time: 11/16/15 12:45 AM  Result Value Ref Range   Squamous Epithelial / LPF 0-5 (A) NONE SEEN   WBC, UA NONE SEEN 0 - 5 WBC/hpf   RBC / HPF 0-5 0 - 5 RBC/hpf   Bacteria, UA RARE (A) NONE SEEN    Imaging Studies: Dg Chest 2 View  11/15/2015  CLINICAL DATA:  Decreased  urine output since yesterday, vomiting x2. History of diabetes, hypertension, colon/lung cancer. EXAM: CHEST  2 VIEW COMPARISON:  Chest x-rays dated 10/11/2015 and 08/12/2015. Comparison also made to chest CT dated 08/12/2015. FINDINGS: Small nodular masslike consolidation persists within the right perihilar lung, slightly decreased in size compared to the previous exam of 10/11/2015, significantly decreased in size compared to the earlier chest x-ray of 08/12/2015. Overall, lungs appear much improved compared to the earlier chest x-ray of 08/12/2015. No new lung findings. No pleural effusion or pneumothorax seen. Heart size is normal. Overall cardiomediastinal silhouette is stable in size and configuration. Right chest wall Port-A-Cath remains in place with tip stable in position at the level of the lower SVC. Osseous structures about the chest are unremarkable. IMPRESSION: Interval improvement compared to the chest x-rays of 10/11/2015 and 08/12/2015, as detailed above. No new lung findings. No evidence of pneumonia or pleural effusion. Heart size remains normal. Electronically Signed   By: Franki Cabot M.D.   On: 11/15/2015 21:07      Shanon Rosser, MD 11/16/15 [phone]

## 2016-05-19 ENCOUNTER — Emergency Department (HOSPITAL_BASED_OUTPATIENT_CLINIC_OR_DEPARTMENT_OTHER): Payer: BC Managed Care – PPO

## 2016-05-19 ENCOUNTER — Encounter (HOSPITAL_BASED_OUTPATIENT_CLINIC_OR_DEPARTMENT_OTHER): Payer: Self-pay | Admitting: *Deleted

## 2016-05-19 ENCOUNTER — Emergency Department (HOSPITAL_BASED_OUTPATIENT_CLINIC_OR_DEPARTMENT_OTHER)
Admission: EM | Admit: 2016-05-19 | Discharge: 2016-05-19 | Disposition: A | Discharge status: Home / Self Care | Payer: BC Managed Care – PPO | Attending: Emergency Medicine | Admitting: Emergency Medicine

## 2016-05-19 DIAGNOSIS — I10 Essential (primary) hypertension: Secondary | ICD-10-CM | POA: Insufficient documentation

## 2016-05-19 DIAGNOSIS — Z79899 Other long term (current) drug therapy: Secondary | ICD-10-CM | POA: Diagnosis not present

## 2016-05-19 DIAGNOSIS — E119 Type 2 diabetes mellitus without complications: Secondary | ICD-10-CM | POA: Insufficient documentation

## 2016-05-19 DIAGNOSIS — R112 Nausea with vomiting, unspecified: Secondary | ICD-10-CM | POA: Diagnosis present

## 2016-05-19 DIAGNOSIS — Z794 Long term (current) use of insulin: Secondary | ICD-10-CM | POA: Diagnosis not present

## 2016-05-19 DIAGNOSIS — Z85048 Personal history of other malignant neoplasm of rectum, rectosigmoid junction, and anus: Secondary | ICD-10-CM | POA: Diagnosis not present

## 2016-05-19 LAB — I-STAT CG4 LACTIC ACID, ED: Lactic Acid, Venous: 1.51 mmol/L (ref 0.5–1.9)

## 2016-05-19 LAB — URINE MICROSCOPIC-ADD ON

## 2016-05-19 LAB — CBC WITH DIFFERENTIAL/PLATELET
Basophils Absolute: 0 10*3/uL (ref 0.0–0.1)
Basophils Relative: 0 %
Eosinophils Absolute: 0 10*3/uL (ref 0.0–0.7)
Eosinophils Relative: 0 %
HCT: 31.6 % — ABNORMAL LOW (ref 36.0–46.0)
Hemoglobin: 10.2 g/dL — ABNORMAL LOW (ref 12.0–15.0)
Lymphocytes Relative: 5 %
Lymphs Abs: 0.5 10*3/uL — ABNORMAL LOW (ref 0.7–4.0)
MCH: 26.6 pg (ref 26.0–34.0)
MCHC: 32.3 g/dL (ref 30.0–36.0)
MCV: 82.3 fL (ref 78.0–100.0)
Monocytes Absolute: 0.3 10*3/uL (ref 0.1–1.0)
Monocytes Relative: 3 %
Neutro Abs: 8.9 10*3/uL — ABNORMAL HIGH (ref 1.7–7.7)
Neutrophils Relative %: 92 %
Platelets: 133 10*3/uL — ABNORMAL LOW (ref 150–400)
RBC: 3.84 MIL/uL — ABNORMAL LOW (ref 3.87–5.11)
RDW: 16.9 % — ABNORMAL HIGH (ref 11.5–15.5)
WBC: 9.7 10*3/uL (ref 4.0–10.5)

## 2016-05-19 LAB — COMPREHENSIVE METABOLIC PANEL
ALT: 61 U/L — ABNORMAL HIGH (ref 14–54)
AST: 29 U/L (ref 15–41)
Albumin: 3.3 g/dL — ABNORMAL LOW (ref 3.5–5.0)
Alkaline Phosphatase: 215 U/L — ABNORMAL HIGH (ref 38–126)
Anion gap: 5 (ref 5–15)
BUN: 29 mg/dL — ABNORMAL HIGH (ref 6–20)
CO2: 27 mmol/L (ref 22–32)
Calcium: 9.3 mg/dL (ref 8.9–10.3)
Chloride: 101 mmol/L (ref 101–111)
Creatinine, Ser: 0.86 mg/dL (ref 0.44–1.00)
GFR calc Af Amer: >60 mL/min (ref >60)
GFR calc non Af Amer: >60 mL/min (ref >60)
Glucose, Bld: 348 mg/dL — ABNORMAL HIGH (ref 65–99)
Potassium: 4.3 mmol/L (ref 3.5–5.1)
Sodium: 133 mmol/L — ABNORMAL LOW (ref 135–145)
Total Bilirubin: 0.8 mg/dL (ref 0.3–1.2)
Total Protein: 6.7 g/dL (ref 6.5–8.1)

## 2016-05-19 LAB — URINALYSIS, ROUTINE W REFLEX MICROSCOPIC
Bilirubin Urine: NEGATIVE
Glucose, UA: >1000 mg/dL — AB
Ketones, ur: NEGATIVE mg/dL
Leukocytes, UA: NEGATIVE
Nitrite: NEGATIVE
Protein, ur: 100 mg/dL — AB
Specific Gravity, Urine: 1.022 (ref 1.005–1.030)
pH: 7 (ref 5.0–8.0)

## 2016-05-19 MED ORDER — HEPARIN SOD (PORK) LOCK FLUSH 100 UNIT/ML IV SOLN
INTRAVENOUS | Status: AC
  Filled 2016-05-19: qty 5

## 2016-05-19 MED ORDER — ONDANSETRON HCL 4 MG/2ML IJ SOLN
4.0000 mg | Freq: Once | INTRAMUSCULAR | Status: AC
  Administered 2016-05-19: 4 mg via INTRAVENOUS
  Filled 2016-05-19: qty 2

## 2016-05-19 MED ORDER — ONDANSETRON HCL 4 MG PO TABS: 4.0000 mg | ORAL_TABLET | Freq: Four times a day (QID) | ORAL | 0 refills | Status: DC

## 2016-05-19 MED ORDER — HEPARIN SOD (PORK) LOCK FLUSH 100 UNIT/ML IV SOLN
500.0000 [IU] | Freq: Once | INTRAVENOUS | Status: AC
  Administered 2016-05-19: 500 [IU]

## 2016-05-19 MED ORDER — SODIUM CHLORIDE 0.9 % IV BOLUS (SEPSIS)
1000.0000 mL | Freq: Once | INTRAVENOUS | Status: AC
  Administered 2016-05-19: 1000 mL via INTRAVENOUS

## 2016-05-19 MED FILL — ONDANSETRON HCL 4 MG TABLET: 4 | 3 days supply | Qty: 12 | Fill #0

## 2016-05-19 NOTE — ED Notes (Addendum)
Pt declines piv, requests that we use her port. Labs obtained from dobule portacath in right chest, line already accessed by pt home health nurse, chemo infusing via portable pump to left port, right port used by daughter for tpn, has been flushed with ns per daughter, initial draw discarded and 10cc blood drawn from right port for lab work.

## 2016-05-19 NOTE — ED Provider Notes (Signed)
[place] DEPT [facility] Provider Note   CSN: [id number] Arrival date & time: 05/19/16  1023    History   Chief Complaint Chief Complaint  Patient presents with  . Nausea  . Emesis    HPI Barbara Matthews is a 60 y.o. female.  HPI    60 year old female presents today with nausea and vomiting. Patient has a significant past medical history of stage IV colorectal cancer status post neoadjuvant chemo/RT; s/p adjuvant Oxaliplatin and Xeloda. She is currently on FOLFIRI and Avastin.  Patient notes that over the last 2 days she has had nausea and vomiting. She notes this is similar to previous episodes status post chemotherapy, but is slightly more severe. She notes that she did not have any antinausea medication at home. Daughter is at bedside and reports that patient stated that she had a elevated temperature after using a thermometer. Daughter notes that she used a thermometer and it was 100. Patient denies any upper respiratory complaints, chest pain or shortness of breath, abdominal pain, diarrhea, urinary abnormalities, rash, or any other significant infectious etiology.   Past Medical History:  Diagnosis Date  . Anxiety   . Cancer ([place])   . Diabetes mellitus without complication ([place])   . Elevated cholesterol   . Hemorrhoids   . Hypertension   . Status post colostomy [facility])     Patient Active Problem List   Diagnosis Date Noted  . Sepsis ([place]) 05/05/2015  . HCAP (healthcare-associated pneumonia) 05/05/2015  . Small bowel ischemia ([place]) 05/05/2015  . Hypokalemia 05/05/2015  . Hypomagnesemia 05/05/2015  . Intestinal occlusion   . Essential hypertension 05/01/2015  . Diabetes mellitus type 2, uncontrolled, with complications ([place]) 85/63/1497  . Tachycardia 05/01/2015  . Lactic acidosis 05/01/2015  . SBO (small bowel obstruction) 04/30/2015  . Anal cancer-adenocarcinoma 02/05/2014  . Mass of anus 10/10/2013    Past Surgical History:  Procedure Laterality Date  .  ABDOMINAL HYSTERECTOMY    . COLONOSCOPY N/A 10/19/2013   Procedure: COLONOSCOPY;  Surgeon: Leighton Ruff, MD;  Location: [facility] ENDOSCOPY;  Service: Endoscopy;  Laterality: N/A;  . ILEOSTOMY    . LACERATION REPAIR    . PICC LINE PLACE PERIPHERAL ([place] [facility]X)    . PORT A CATH INJECTION ([place] [facility])      OB History    No data available       Home Medications    Prior to Admission medications   Medication Sig Start Date End Date Taking? Authorizing Provider  antiseptic oral rinse (CPC / CETYLPYRIDINIUM CHLORIDE 0.05%) 0.05 % LIQD solution 7 mLs by Mouth Rinse route 2 times daily at 12 noon and 4 pm. 05/05/15   Venetia Maxon Rama, MD  chlorhexidine (PERIDEX) 0.12 % solution 15 mLs by Mouth Rinse route 2 (two) times daily. 05/05/15   Christina P Rama, MD  enoxaparin (LOVENOX) 40 MG/0.4ML injection Inject 0.4 mLs (40 mg total) into the skin daily. 05/05/15   Venetia Maxon Rama, MD  hydrALAZINE (APRESOLINE) 20 MG/ML injection Inject 0.5 mLs (10 mg total) into the vein every 6 (six) hours as needed (sbp>160). 05/05/15   Venetia Maxon Rama, MD  HYDROmorphone (DILAUDID) 1 MG/ML injection Inject 0.5 mLs (0.5 mg total) into the vein every 4 (four) hours as needed for severe pain. 05/05/15   Venetia Maxon Rama, MD  insulin aspart (NOVOLOG) 100 UNIT/ML injection Inject 0-15 Units into the skin every 4 (four) hours. 05/05/15   Venetia Maxon Rama, MD  insulin glargine (LANTUS) 100 UNIT/ML injection Inject 0.05 mLs (  5 Units total) into the skin at bedtime. 05/05/15   Venetia Maxon Rama, MD  levalbuterol (XOPENEX) 0.63 MG/3ML nebulizer solution Take 3 mLs (0.63 mg total) by nebulization every 6 (six) hours as needed for wheezing or shortness of breath. 05/05/15   Venetia Maxon Rama, MD  levofloxacin (LEVAQUIN) 500 MG tablet Take 500 mg by mouth daily.    Historical Provider, MD  LORazepam (ATIVAN) 2 MG/ML injection Inject 0.5 mLs (1 mg total) into the vein at bedtime. 05/05/15   Venetia Maxon Rama, MD  metoprolol (LOPRESSOR) 1 MG/ML  injection Inject 5 mLs (5 mg total) into the vein every 6 (six) hours. 05/05/15   Christina P Rama, MD  ondansetron (ZOFRAN) 4 MG tablet Take 1 tablet (4 mg total) by mouth every 6 (six) hours. 05/19/16   Latandra Loureiro, PA-C  piperacillin-tazobactam (ZOSYN) 3.375 GM/50ML IVPB Inject 50 mLs (3.375 g total) into the vein every 8 (eight) hours. 05/05/15   Venetia Maxon Rama, MD  vancomycin (VANCOCIN) 1 GM/200ML SOLN Inject 200 mLs (1,000 mg total) into the vein every 12 (twelve) hours. 05/05/15   Venetia Maxon Rama, MD    Family History History reviewed. No pertinent family history.  Social History Social History  Substance Use Topics  . Smoking status: Former Smoker    Packs/day: 0.50    Years: 34.00    Types: Cigarettes    Start date: 07/15/1975    Quit date: 05/14/2009  . Smokeless tobacco: Never Used     Comment: quit 5 years ago  . Alcohol use No     Allergies   Review of patient's allergies indicates no known allergies.   Review of Systems Review of Systems  All other systems reviewed and are negative.  Physical Exam Updated Vital Signs BP 147/76 (BP Location: Right Arm)   Pulse 100   Temp 99.9 F (37.7 C) (Oral)   Resp 16   Ht '5\' 1"'$  (1.549 m)   Wt 68 kg   SpO2 100%   BMI 28.34 kg/m   Physical Exam  Constitutional: She is oriented to person, place, and time. She appears well-developed and well-nourished.  HENT:  Head: Normocephalic and atraumatic.  Eyes: Conjunctivae are normal. Pupils are equal, round, and reactive to light. Right eye exhibits no discharge. Left eye exhibits no discharge. No scleral icterus.  Neck: Normal range of motion. No JVD present. No tracheal deviation present.  Pulmonary/Chest: Effort normal. No stridor. No respiratory distress. She has no wheezes. She has no rales. She exhibits no tenderness.  Right anterior chest port in place no signs of surrounding infection  Abdominal: Soft. She exhibits no distension. There is no tenderness.    Neurological: She is alert and oriented to person, place, and time. Coordination normal.  Skin: Skin is warm.  Psychiatric: She has a normal mood and affect. Her behavior is normal. Judgment and thought content normal.  Nursing note and vitals reviewed.   ED Treatments / Results  Labs (all labs ordered are listed, but only abnormal results are displayed) Labs Reviewed  CBC WITH DIFFERENTIAL/PLATELET - Abnormal; Notable for the following:       Result Value   RBC 3.84 (*)    Hemoglobin 10.2 (*)    HCT 31.6 (*)    RDW 16.9 (*)    Platelets 133 (*)    Neutro Abs 8.9 (*)    Lymphs Abs 0.5 (*)    All other components within normal limits  COMPREHENSIVE METABOLIC PANEL - Abnormal; Notable for  the following:    Sodium 133 (*)    Glucose, Bld 348 (*)    BUN 29 (*)    Albumin 3.3 (*)    ALT 61 (*)    Alkaline Phosphatase 215 (*)    All other components within normal limits  URINALYSIS, ROUTINE W REFLEX MICROSCOPIC (NOT AT [facility]) - Abnormal; Notable for the following:    Glucose, UA >1000 (*)    Hgb urine dipstick MODERATE (*)    Protein, ur 100 (*)    All other components within normal limits  URINE MICROSCOPIC-ADD ON - Abnormal; Notable for the following:    Squamous Epithelial / LPF 0-5 (*)    Bacteria, UA RARE (*)    All other components within normal limits  I-STAT CG4 LACTIC ACID, ED    EKG  EKG Interpretation None       Radiology Dg Chest 2 View  Result Date: 05/19/2016 CLINICAL DATA:  Headache, nausea.  Cancer patient. EXAM: CHEST  2 VIEW COMPARISON:  11/15/2015 FINDINGS: Right Port-A-Cath remains in place, unchanged. Heart and mediastinal contours are within normal limits. No focal opacities or effusions. No acute bony abnormality. IMPRESSION: No active cardiopulmonary disease. Electronically Signed   By: Rolm Baptise M.D.   On: 05/19/2016 11:18    Procedures Procedures (including critical care time)  Medications Ordered in ED Medications  heparin lock flush  100 UNIT/ML injection (not administered)  sodium chloride 0.9 % bolus 1,000 mL (1,000 mLs Intravenous New Bag/Given 05/19/16 1105)  ondansetron (ZOFRAN) injection 4 mg (4 mg Intravenous Given 05/19/16 1115)  heparin lock flush 100 unit/mL (500 Units Intracatheter Given 05/19/16 1448)     Initial Impression / Assessment and Plan / ED Course  I have reviewed the triage vital signs and the nursing notes.  Pertinent labs & imaging results that were available during my care of the patient were reviewed by me and considered in my medical decision making (see chart for details).  Clinical Course    Final Clinical Impressions(s) / ED Diagnoses   Final diagnoses:  Nausea and vomiting, intractability of vomiting not specified, unspecified vomiting type   Labs:   Imaging:  Consults:  Therapeutics:  Discharge Meds:   Assessment/Plan:  60 year old female presents today with nausea vomiting. Patient has no diarrhea, no signs of infectious etiology. Patient significantly improved after antinausea medication, tolerating by mouth without difficulty. Patient has no elevation or decrease in her white blood cells, and has reassuring laboratory and vital signs. Patient was noted to have a slight decrease in her platelets, she will be encouraged follow-up with oncologist in 2-3 days for reevaluation and repeat lab draw. With the patient and her daughter verbalized understanding and agreement to today's plan had no further questions or concerns at time of discharge.    New Prescriptions New Prescriptions   ONDANSETRON (ZOFRAN) 4 MG TABLET    Take 1 tablet (4 mg total) by mouth every 6 (six) hours.     Okey Regal, PA-C 05/19/16 1450    Merrily Pew, MD 05/20/16 [phone]

## 2016-05-19 NOTE — Discharge Instructions (Signed)
Please follow-up with your oncologist in 2 days for reevaluation and repeat laboratory analysis. Please inform them of your lab results and all relevant data from today's visit. Please return immediately if you experience any new or worsening signs or symptoms

## 2016-05-19 NOTE — ED Notes (Signed)
PA at the bedside.

## 2016-08-09 DIAGNOSIS — R569 Unspecified convulsions: Secondary | ICD-10-CM

## 2016-08-09 DIAGNOSIS — G939 Disorder of brain, unspecified: Secondary | ICD-10-CM

## 2016-08-09 HISTORY — DX: Disorder of brain, unspecified: G93.9

## 2016-08-09 HISTORY — DX: Unspecified convulsions: R56.9

## 2016-09-12 ENCOUNTER — Emergency Department (HOSPITAL_COMMUNITY): Payer: BC Managed Care – PPO

## 2016-09-12 ENCOUNTER — Inpatient Hospital Stay (HOSPITAL_COMMUNITY)
Admission: EM | Admit: 2016-09-12 | Discharge: 2016-09-17 | DRG: 956 | Disposition: A | Discharge status: Rehabilitation Facility | Payer: BC Managed Care – PPO | Attending: Surgery | Admitting: Surgery

## 2016-09-12 ENCOUNTER — Encounter (HOSPITAL_COMMUNITY): Payer: Self-pay | Admitting: Emergency Medicine

## 2016-09-12 DIAGNOSIS — T148XXA Other injury of unspecified body region, initial encounter: Secondary | ICD-10-CM

## 2016-09-12 DIAGNOSIS — T07XXXA Unspecified multiple injuries, initial encounter: Secondary | ICD-10-CM | POA: Diagnosis not present

## 2016-09-12 DIAGNOSIS — M25469 Effusion, unspecified knee: Secondary | ICD-10-CM

## 2016-09-12 DIAGNOSIS — Z79899 Other long term (current) drug therapy: Secondary | ICD-10-CM | POA: Diagnosis not present

## 2016-09-12 DIAGNOSIS — E1142 Type 2 diabetes mellitus with diabetic polyneuropathy: Secondary | ICD-10-CM | POA: Diagnosis present

## 2016-09-12 DIAGNOSIS — S37019D Minor contusion of unspecified kidney, subsequent encounter: Secondary | ICD-10-CM | POA: Diagnosis not present

## 2016-09-12 DIAGNOSIS — S2241XA Multiple fractures of ribs, right side, initial encounter for closed fracture: Secondary | ICD-10-CM | POA: Diagnosis present

## 2016-09-12 DIAGNOSIS — C19 Malignant neoplasm of rectosigmoid junction: Secondary | ICD-10-CM | POA: Diagnosis present

## 2016-09-12 DIAGNOSIS — Y92410 Unspecified street and highway as the place of occurrence of the external cause: Secondary | ICD-10-CM

## 2016-09-12 DIAGNOSIS — E46 Unspecified protein-calorie malnutrition: Secondary | ICD-10-CM | POA: Diagnosis not present

## 2016-09-12 DIAGNOSIS — D62 Acute posthemorrhagic anemia: Secondary | ICD-10-CM | POA: Diagnosis present

## 2016-09-12 DIAGNOSIS — E8889 Other specified metabolic disorders: Secondary | ICD-10-CM | POA: Diagnosis present

## 2016-09-12 DIAGNOSIS — E875 Hyperkalemia: Secondary | ICD-10-CM | POA: Diagnosis present

## 2016-09-12 DIAGNOSIS — S7292XS Unspecified fracture of left femur, sequela: Secondary | ICD-10-CM | POA: Diagnosis not present

## 2016-09-12 DIAGNOSIS — Z933 Colostomy status: Secondary | ICD-10-CM

## 2016-09-12 DIAGNOSIS — S72402A Unspecified fracture of lower end of left femur, initial encounter for closed fracture: Secondary | ICD-10-CM

## 2016-09-12 DIAGNOSIS — E11649 Type 2 diabetes mellitus with hypoglycemia without coma: Secondary | ICD-10-CM | POA: Diagnosis not present

## 2016-09-12 DIAGNOSIS — S72402S Unspecified fracture of lower end of left femur, sequela: Secondary | ICD-10-CM | POA: Diagnosis not present

## 2016-09-12 DIAGNOSIS — E872 Acidosis: Secondary | ICD-10-CM | POA: Diagnosis present

## 2016-09-12 DIAGNOSIS — S2249XA Multiple fractures of ribs, unspecified side, initial encounter for closed fracture: Secondary | ICD-10-CM

## 2016-09-12 DIAGNOSIS — M25552 Pain in left hip: Secondary | ICD-10-CM | POA: Diagnosis present

## 2016-09-12 DIAGNOSIS — S2241XD Multiple fractures of ribs, right side, subsequent encounter for fracture with routine healing: Secondary | ICD-10-CM | POA: Diagnosis present

## 2016-09-12 DIAGNOSIS — S72462A Displaced supracondylar fracture with intracondylar extension of lower end of left femur, initial encounter for closed fracture: Secondary | ICD-10-CM | POA: Diagnosis present

## 2016-09-12 DIAGNOSIS — C210 Malignant neoplasm of anus, unspecified: Secondary | ICD-10-CM | POA: Diagnosis not present

## 2016-09-12 DIAGNOSIS — S72492S Other fracture of lower end of left femur, sequela: Secondary | ICD-10-CM | POA: Diagnosis not present

## 2016-09-12 DIAGNOSIS — S37041A Minor laceration of right kidney, initial encounter: Secondary | ICD-10-CM | POA: Diagnosis present

## 2016-09-12 DIAGNOSIS — M25569 Pain in unspecified knee: Secondary | ICD-10-CM

## 2016-09-12 DIAGNOSIS — S2220XD Unspecified fracture of sternum, subsequent encounter for fracture with routine healing: Secondary | ICD-10-CM | POA: Diagnosis not present

## 2016-09-12 DIAGNOSIS — S36115A Moderate laceration of liver, initial encounter: Secondary | ICD-10-CM | POA: Diagnosis present

## 2016-09-12 DIAGNOSIS — Z87891 Personal history of nicotine dependence: Secondary | ICD-10-CM

## 2016-09-12 DIAGNOSIS — F4323 Adjustment disorder with mixed anxiety and depressed mood: Secondary | ICD-10-CM | POA: Diagnosis not present

## 2016-09-12 DIAGNOSIS — Z6827 Body mass index (BMI) 27.0-27.9, adult: Secondary | ICD-10-CM | POA: Diagnosis not present

## 2016-09-12 DIAGNOSIS — Z789 Other specified health status: Secondary | ICD-10-CM | POA: Diagnosis not present

## 2016-09-12 DIAGNOSIS — Z7984 Long term (current) use of oral hypoglycemic drugs: Secondary | ICD-10-CM | POA: Diagnosis not present

## 2016-09-12 DIAGNOSIS — S36113D Laceration of liver, unspecified degree, subsequent encounter: Secondary | ICD-10-CM | POA: Diagnosis not present

## 2016-09-12 DIAGNOSIS — Z9221 Personal history of antineoplastic chemotherapy: Secondary | ICD-10-CM

## 2016-09-12 DIAGNOSIS — E118 Type 2 diabetes mellitus with unspecified complications: Secondary | ICD-10-CM | POA: Diagnosis not present

## 2016-09-12 DIAGNOSIS — I10 Essential (primary) hypertension: Secondary | ICD-10-CM | POA: Diagnosis present

## 2016-09-12 DIAGNOSIS — C7800 Secondary malignant neoplasm of unspecified lung: Secondary | ICD-10-CM | POA: Diagnosis present

## 2016-09-12 DIAGNOSIS — E119 Type 2 diabetes mellitus without complications: Secondary | ICD-10-CM

## 2016-09-12 DIAGNOSIS — E871 Hypo-osmolality and hyponatremia: Secondary | ICD-10-CM | POA: Diagnosis not present

## 2016-09-12 DIAGNOSIS — M7989 Other specified soft tissue disorders: Secondary | ICD-10-CM | POA: Diagnosis not present

## 2016-09-12 DIAGNOSIS — R627 Adult failure to thrive: Secondary | ICD-10-CM | POA: Diagnosis present

## 2016-09-12 DIAGNOSIS — E1165 Type 2 diabetes mellitus with hyperglycemia: Secondary | ICD-10-CM | POA: Diagnosis present

## 2016-09-12 DIAGNOSIS — S72402D Unspecified fracture of lower end of left femur, subsequent encounter for closed fracture with routine healing: Secondary | ICD-10-CM | POA: Diagnosis not present

## 2016-09-12 DIAGNOSIS — R509 Fever, unspecified: Secondary | ICD-10-CM | POA: Diagnosis not present

## 2016-09-12 DIAGNOSIS — Z433 Encounter for attention to colostomy: Secondary | ICD-10-CM | POA: Diagnosis not present

## 2016-09-12 DIAGNOSIS — D638 Anemia in other chronic diseases classified elsewhere: Secondary | ICD-10-CM | POA: Diagnosis not present

## 2016-09-12 DIAGNOSIS — Z419 Encounter for procedure for purposes other than remedying health state, unspecified: Secondary | ICD-10-CM

## 2016-09-12 DIAGNOSIS — S2239XA Fracture of one rib, unspecified side, initial encounter for closed fracture: Secondary | ICD-10-CM

## 2016-09-12 HISTORY — DX: Encounter for other specified aftercare: Z51.89

## 2016-09-12 HISTORY — DX: Malignant neoplasm of rectum: C20

## 2016-09-12 HISTORY — DX: Unspecified fracture of lower end of left femur, initial encounter for closed fracture: S72.402A

## 2016-09-12 LAB — COMPREHENSIVE METABOLIC PANEL
ALK PHOS: 142 U/L — AB (ref 38–126)
ALT: 101 U/L — AB (ref 14–54)
AST: 113 U/L — AB (ref 15–41)
Albumin: 3.4 g/dL — ABNORMAL LOW (ref 3.5–5.0)
Anion gap: 13 (ref 5–15)
BUN: 28 mg/dL — AB (ref 6–20)
CALCIUM: 9 mg/dL (ref 8.9–10.3)
CHLORIDE: 99 mmol/L — AB (ref 101–111)
CO2: 23 mmol/L (ref 22–32)
CREATININE: 0.85 mg/dL (ref 0.44–1.00)
GFR calc Af Amer: >60 mL/min (ref >60)
GFR calc non Af Amer: >60 mL/min (ref >60)
Glucose, Bld: 299 mg/dL — ABNORMAL HIGH (ref 65–99)
Potassium: 3.7 mmol/L (ref 3.5–5.1)
SODIUM: 135 mmol/L (ref 135–145)
Total Bilirubin: 1.3 mg/dL — ABNORMAL HIGH (ref 0.3–1.2)
Total Protein: 7.1 g/dL (ref 6.5–8.1)

## 2016-09-12 LAB — I-STAT CHEM 8, ED
BUN: 32 mg/dL — ABNORMAL HIGH (ref 6–20)
CHLORIDE: 102 mmol/L (ref 101–111)
Calcium, Ion: 1.14 mmol/L — ABNORMAL LOW (ref 1.15–1.40)
Creatinine, Ser: 0.8 mg/dL (ref 0.44–1.00)
Glucose, Bld: 291 mg/dL — ABNORMAL HIGH (ref 65–99)
HEMATOCRIT: 34 % — AB (ref 36.0–46.0)
HEMOGLOBIN: 11.6 g/dL — AB (ref 12.0–15.0)
POTASSIUM: 3.9 mmol/L (ref 3.5–5.1)
Sodium: 139 mmol/L (ref 135–145)
TCO2: 26 mmol/L (ref 0–100)

## 2016-09-12 LAB — ETHANOL: Alcohol, Ethyl (B): <5 mg/dL (ref <5)

## 2016-09-12 LAB — CBC
HCT: 33.6 % — ABNORMAL LOW (ref 36.0–46.0)
HCT: 24.3 % — ABNORMAL LOW (ref 36.0–46.0)
Hemoglobin: 10.4 g/dL — ABNORMAL LOW (ref 12.0–15.0)
Hemoglobin: 7.5 g/dL — ABNORMAL LOW (ref 12.0–15.0)
MCH: 23.6 pg — AB (ref 26.0–34.0)
MCH: 23.7 pg — AB (ref 26.0–34.0)
MCHC: 31 g/dL (ref 30.0–36.0)
MCHC: 30.9 g/dL (ref 30.0–36.0)
MCV: 76.4 fL — AB (ref 78.0–100.0)
MCV: 76.7 fL — AB (ref 78.0–100.0)
PLATELETS: 115 10*3/uL — AB (ref 150–400)
Platelets: 103 10*3/uL — ABNORMAL LOW (ref 150–400)
RBC: 4.4 MIL/uL (ref 3.87–5.11)
RBC: 3.17 MIL/uL — AB (ref 3.87–5.11)
RDW: 17.5 % — AB (ref 11.5–15.5)
RDW: 18.2 % — AB (ref 11.5–15.5)
WBC: 20.1 10*3/uL — ABNORMAL HIGH (ref 4.0–10.5)
WBC: 22.1 10*3/uL — AB (ref 4.0–10.5)

## 2016-09-12 LAB — SAMPLE TO BLOOD BANK

## 2016-09-12 LAB — PROTIME-INR
INR: 1.08
PROTHROMBIN TIME: 14 s (ref 11.4–15.2)

## 2016-09-12 LAB — MRSA PCR SCREENING: MRSA by PCR: NEGATIVE

## 2016-09-12 LAB — I-STAT CG4 LACTIC ACID, ED: Lactic Acid, Venous: 2.5 mmol/L (ref 0.5–1.9)

## 2016-09-12 MED ORDER — SODIUM CHLORIDE 0.9 % IV BOLUS (SEPSIS)
500.0000 mL | Freq: Once | INTRAVENOUS | Status: AC
  Administered 2016-09-12: 500 mL via INTRAVENOUS

## 2016-09-12 MED ORDER — FENTANYL CITRATE (PF) 100 MCG/2ML IJ SOLN
INTRAMUSCULAR | Status: AC
  Filled 2016-09-12: qty 2

## 2016-09-12 MED ORDER — SODIUM CHLORIDE 0.9 % IV BOLUS (SEPSIS)
1000.0000 mL | Freq: Once | INTRAVENOUS | Status: AC
  Administered 2016-09-12: 1000 mL via INTRAVENOUS

## 2016-09-12 MED ORDER — ONDANSETRON HCL 4 MG/2ML IJ SOLN
4.0000 mg | Freq: Four times a day (QID) | INTRAMUSCULAR | Status: DC | PRN
  Administered 2016-09-13: 4 mg via INTRAVENOUS

## 2016-09-12 MED ORDER — SODIUM CHLORIDE 0.9 % IV SOLN
Freq: Once | INTRAVENOUS | Status: AC
  Administered 2016-09-12: 16:00:00 via INTRAVENOUS

## 2016-09-12 MED ORDER — CHLORHEXIDINE GLUCONATE 4 % EX LIQD
60.0000 mL | Freq: Once | CUTANEOUS | Status: DC
  Filled 2016-09-12: qty 60

## 2016-09-12 MED ORDER — FENTANYL CITRATE (PF) 100 MCG/2ML IJ SOLN
50.0000 ug | Freq: Once | INTRAMUSCULAR | Status: AC
  Administered 2016-09-12: 50 ug via INTRAVENOUS

## 2016-09-12 MED ORDER — POTASSIUM CHLORIDE IN NACL 20-0.9 MEQ/L-% IV SOLN
INTRAVENOUS | Status: DC
  Administered 2016-09-12: 22:00:00 via INTRAVENOUS
  Filled 2016-09-12 (×2): qty 1000

## 2016-09-12 MED ORDER — ONDANSETRON HCL 4 MG/2ML IJ SOLN
INTRAMUSCULAR | Status: AC
Start: 2016-09-12 — End: 2016-09-13
  Filled 2016-09-12: qty 2

## 2016-09-12 MED ORDER — ONDANSETRON HCL 4 MG/2ML IJ SOLN
4.0000 mg | Freq: Once | INTRAMUSCULAR | Status: AC
  Administered 2016-09-12: 4 mg via INTRAVENOUS

## 2016-09-12 MED ORDER — ONDANSETRON HCL 4 MG/2ML IJ SOLN
4.0000 mg | Freq: Once | INTRAMUSCULAR | Status: AC
  Administered 2016-09-12: 4 mg via INTRAVENOUS
  Filled 2016-09-12: qty 2

## 2016-09-12 MED ORDER — HYDROMORPHONE HCL 1 MG/ML IJ SOLN
1.0000 mg | INTRAMUSCULAR | Status: DC | PRN
  Administered 2016-09-13 – 2016-09-14 (×4): 1 mg via INTRAVENOUS
  Filled 2016-09-12 (×5): qty 1

## 2016-09-12 MED ORDER — MORPHINE SULFATE (PF) 4 MG/ML IV SOLN: 4.0000 mg | INTRAVENOUS | Status: DC | PRN

## 2016-09-12 MED ORDER — CEFAZOLIN SODIUM-DEXTROSE 2-4 GM/100ML-% IV SOLN
2.0000 g | INTRAVENOUS | Status: DC
  Filled 2016-09-12: qty 100

## 2016-09-12 MED ORDER — POVIDONE-IODINE 10 % EX SWAB: 2.0000 "application " | Freq: Once | CUTANEOUS | Status: DC

## 2016-09-12 MED ORDER — MORPHINE SULFATE (PF) 2 MG/ML IV SOLN
2.0000 mg | INTRAVENOUS | Status: DC | PRN
  Administered 2016-09-13 – 2016-09-14 (×4): 2 mg via INTRAVENOUS
  Filled 2016-09-12 (×4): qty 1

## 2016-09-12 MED ORDER — ONDANSETRON HCL 4 MG PO TABS: 4.0000 mg | ORAL_TABLET | Freq: Four times a day (QID) | ORAL | Status: DC | PRN

## 2016-09-12 MED ORDER — MORPHINE SULFATE (PF) 2 MG/ML IV SOLN: 1.0000 mg | INTRAVENOUS | Status: DC | PRN

## 2016-09-12 MED ORDER — IOPAMIDOL (ISOVUE-300) INJECTION 61%
INTRAVENOUS | Status: AC
  Administered 2016-09-12: 100 mL
  Filled 2016-09-12: qty 100
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
## 2016-09-12 NOTE — ED Notes (Signed)
CT called advised patient ready for scan.

## 2016-09-12 NOTE — ED Notes (Signed)
Family asking for update from EDP; EDP informed of same

## 2016-09-12 NOTE — ED Notes (Signed)
Ortho Tech placed knee immobilization.
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
## 2016-09-12 NOTE — ED Notes (Signed)
Pt states she only feels pain when she vomits; pt c/o pain related to bruising under right breast; will obtain order for antiemetic

## 2016-09-12 NOTE — Progress Notes (Signed)
Orthopedic Tech Progress Note Patient Details:  Barbara Matthews 06/08/1956 [id number]  Ortho Devices Type of Ortho Device: Knee Immobilizer Ortho Device/Splint Location: LLE Ortho Device/Splint Interventions: Ordered, Application   Braulio Bosch 09/12/2016, 4:31 PM

## 2016-09-12 NOTE — ED Notes (Signed)
Family at beside. Family given emotional support. 
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
## 2016-09-12 NOTE — ED Notes (Signed)
Patient placed on 2L

## 2016-09-12 NOTE — ED Notes (Signed)
Patients daughter given patient Necklace.

## 2016-09-12 NOTE — ED Notes (Signed)
Patient in CT

## 2016-09-13 ENCOUNTER — Inpatient Hospital Stay (HOSPITAL_COMMUNITY): Payer: BC Managed Care – PPO

## 2016-09-13 ENCOUNTER — Inpatient Hospital Stay (HOSPITAL_COMMUNITY): Payer: BC Managed Care – PPO | Admitting: Anesthesiology

## 2016-09-13 ENCOUNTER — Encounter (HOSPITAL_COMMUNITY): Payer: Self-pay | Admitting: Anesthesiology

## 2016-09-13 ENCOUNTER — Encounter (HOSPITAL_COMMUNITY): Admission: EM | Disposition: A | Discharge status: Rehabilitation Facility | Payer: Self-pay | Source: Home / Self Care

## 2016-09-13 HISTORY — PX: FEMUR IM NAIL: SHX1597

## 2016-09-13 LAB — CBC
HCT: 28.3 % — ABNORMAL LOW (ref 36.0–46.0)
HCT: 27.7 % — ABNORMAL LOW (ref 36.0–46.0)
HEMATOCRIT: 22.8 % — AB (ref 36.0–46.0)
Hemoglobin: 9.3 g/dL — ABNORMAL LOW (ref 12.0–15.0)
Hemoglobin: 8.9 g/dL — ABNORMAL LOW (ref 12.0–15.0)
Hemoglobin: 7.4 g/dL — ABNORMAL LOW (ref 12.0–15.0)
MCH: 26.5 pg (ref 26.0–34.0)
MCH: 25.5 pg — ABNORMAL LOW (ref 26.0–34.0)
MCH: 25.8 pg — ABNORMAL LOW (ref 26.0–34.0)
MCHC: 32.9 g/dL (ref 30.0–36.0)
MCHC: 32.1 g/dL (ref 30.0–36.0)
MCHC: 32.5 g/dL (ref 30.0–36.0)
MCV: 80.6 fL (ref 78.0–100.0)
MCV: 79.4 fL (ref 78.0–100.0)
MCV: 79.4 fL (ref 78.0–100.0)
PLATELETS: 74 10*3/uL — AB (ref 150–400)
PLATELETS: 67 10*3/uL — AB (ref 150–400)
Platelets: 77 10*3/uL — ABNORMAL LOW (ref 150–400)
RBC: 3.51 MIL/uL — AB (ref 3.87–5.11)
RBC: 3.49 MIL/uL — AB (ref 3.87–5.11)
RBC: 2.87 MIL/uL — ABNORMAL LOW (ref 3.87–5.11)
RDW: 18.3 % — ABNORMAL HIGH (ref 11.5–15.5)
RDW: 17.3 % — AB (ref 11.5–15.5)
RDW: 17.6 % — AB (ref 11.5–15.5)
WBC: 13.1 10*3/uL — AB (ref 4.0–10.5)
WBC: 11.6 10*3/uL — AB (ref 4.0–10.5)
WBC: 10.3 10*3/uL (ref 4.0–10.5)

## 2016-09-13 LAB — BASIC METABOLIC PANEL
ANION GAP: 7 (ref 5–15)
Anion gap: 7 (ref 5–15)
BUN: 30 mg/dL — AB (ref 6–20)
BUN: 28 mg/dL — ABNORMAL HIGH (ref 6–20)
CALCIUM: 8.4 mg/dL — AB (ref 8.9–10.3)
CALCIUM: 8.7 mg/dL — AB (ref 8.9–10.3)
CO2: 23 mmol/L (ref 22–32)
CO2: 23 mmol/L (ref 22–32)
Chloride: 108 mmol/L (ref 101–111)
Chloride: 111 mmol/L (ref 101–111)
Creatinine, Ser: 1 mg/dL (ref 0.44–1.00)
Creatinine, Ser: 1.11 mg/dL — ABNORMAL HIGH (ref 0.44–1.00)
GFR calc Af Amer: >60 mL/min (ref >60)
GFR, EST NON AFRICAN AMERICAN: 60 mL/min — AB (ref >60)
GFR, EST NON AFRICAN AMERICAN: 52 mL/min — AB (ref >60)
GLUCOSE: 183 mg/dL — AB (ref 65–99)
Glucose, Bld: 150 mg/dL — ABNORMAL HIGH (ref 65–99)
POTASSIUM: 5.4 mmol/L — AB (ref 3.5–5.1)
POTASSIUM: 4.5 mmol/L (ref 3.5–5.1)
SODIUM: 138 mmol/L (ref 135–145)
SODIUM: 141 mmol/L (ref 135–145)

## 2016-09-13 LAB — URINALYSIS, ROUTINE W REFLEX MICROSCOPIC
BILIRUBIN URINE: NEGATIVE
Glucose, UA: 50 mg/dL — AB
Hgb urine dipstick: NEGATIVE
KETONES UR: NEGATIVE mg/dL
Leukocytes, UA: NEGATIVE
Nitrite: NEGATIVE
PH: 5 (ref 5.0–8.0)
Protein, ur: 100 mg/dL — AB
SQUAMOUS EPITHELIAL / LPF: NONE SEEN
Specific Gravity, Urine: 1.038 — ABNORMAL HIGH (ref 1.005–1.030)

## 2016-09-13 LAB — ABO/RH: ABO/RH(D): A POS

## 2016-09-13 LAB — GLUCOSE, CAPILLARY
GLUCOSE-CAPILLARY: 115 mg/dL — AB (ref 65–99)
Glucose-Capillary: 112 mg/dL — ABNORMAL HIGH (ref 65–99)
Glucose-Capillary: 136 mg/dL — ABNORMAL HIGH (ref 65–99)
Glucose-Capillary: 156 mg/dL — ABNORMAL HIGH (ref 65–99)
Glucose-Capillary: 165 mg/dL — ABNORMAL HIGH (ref 65–99)
Glucose-Capillary: 167 mg/dL — ABNORMAL HIGH (ref 65–99)

## 2016-09-13 LAB — PREPARE RBC (CROSSMATCH)

## 2016-09-13 LAB — CDS SEROLOGY

## 2016-09-13 LAB — LACTIC ACID, PLASMA: LACTIC ACID, VENOUS: 1.4 mmol/L (ref 0.5–1.9)

## 2016-09-13 SURGERY — INSERTION, INTRAMEDULLARY ROD, FEMUR
Anesthesia: General | Site: Leg Upper | Laterality: Left

## 2016-09-13 MED ORDER — SODIUM CHLORIDE 0.9 % IV SOLN
Freq: Once | INTRAVENOUS | Status: AC
  Administered 2016-09-13: 21:00:00 via INTRAVENOUS

## 2016-09-13 MED ORDER — FUROSEMIDE 10 MG/ML IJ SOLN
20.0000 mg | Freq: Once | INTRAMUSCULAR | Status: AC
  Administered 2016-09-13: 20 mg via INTRAVENOUS
  Filled 2016-09-13: qty 2

## 2016-09-13 MED ORDER — SUCCINYLCHOLINE CHLORIDE 200 MG/10ML IV SOSY
PREFILLED_SYRINGE | INTRAVENOUS | Status: DC | PRN
  Administered 2016-09-13: 100 mg via INTRAVENOUS

## 2016-09-13 MED ORDER — ONDANSETRON HCL 4 MG PO TABS: 4.0000 mg | ORAL_TABLET | Freq: Four times a day (QID) | ORAL | Status: DC | PRN

## 2016-09-13 MED ORDER — METOCLOPRAMIDE HCL 5 MG/ML IJ SOLN: 5.0000 mg | Freq: Three times a day (TID) | INTRAMUSCULAR | Status: DC | PRN

## 2016-09-13 MED ORDER — INSULIN ASPART 100 UNIT/ML ~~LOC~~ SOLN
0.0000 [IU] | SUBCUTANEOUS | Status: AC
  Administered 2016-09-13 (×2): 3 [IU] via SUBCUTANEOUS
  Administered 2016-09-14: 5 [IU] via SUBCUTANEOUS
  Administered 2016-09-14: 3 [IU] via SUBCUTANEOUS

## 2016-09-13 MED ORDER — SODIUM CHLORIDE 0.9 % IV SOLN: Freq: Once | INTRAVENOUS | Status: AC

## 2016-09-13 MED ORDER — ONDANSETRON HCL 4 MG/2ML IJ SOLN: 4.0000 mg | Freq: Four times a day (QID) | INTRAMUSCULAR | Status: DC | PRN

## 2016-09-13 MED ORDER — LACTATED RINGERS IV SOLN
INTRAVENOUS | Status: DC | PRN
  Administered 2016-09-13 (×2): via INTRAVENOUS

## 2016-09-13 MED ORDER — SUGAMMADEX SODIUM 200 MG/2ML IV SOLN
INTRAVENOUS | Status: DC | PRN
  Administered 2016-09-13: 145 mg via INTRAVENOUS

## 2016-09-13 MED ORDER — CEFAZOLIN IN D5W 1 GM/50ML IV SOLN
1.0000 g | Freq: Four times a day (QID) | INTRAVENOUS | Status: AC
  Administered 2016-09-13 – 2016-09-14 (×3): 1 g via INTRAVENOUS
  Filled 2016-09-13 (×3): qty 50

## 2016-09-13 MED ORDER — HYDROMORPHONE HCL 1 MG/ML IJ SOLN: 0.2500 mg | INTRAMUSCULAR | Status: DC | PRN

## 2016-09-13 MED ORDER — MIDAZOLAM HCL 2 MG/2ML IJ SOLN
INTRAMUSCULAR | Status: AC
  Filled 2016-09-13: qty 2

## 2016-09-13 MED ORDER — FENTANYL CITRATE (PF) 100 MCG/2ML IJ SOLN
INTRAMUSCULAR | Status: AC
  Filled 2016-09-13: qty 2

## 2016-09-13 MED ORDER — ALBUMIN HUMAN 5 % IV SOLN
12.5000 g | Freq: Once | INTRAVENOUS | Status: AC
  Administered 2016-09-13: 12.5 g via INTRAVENOUS
  Filled 2016-09-13: qty 250

## 2016-09-13 MED ORDER — METOCLOPRAMIDE HCL 5 MG PO TABS: 5.0000 mg | ORAL_TABLET | Freq: Three times a day (TID) | ORAL | Status: DC | PRN

## 2016-09-13 MED ORDER — METOPROLOL TARTRATE 5 MG/5ML IV SOLN
INTRAVENOUS | Status: AC
  Filled 2016-09-13: qty 5

## 2016-09-13 MED ORDER — METOPROLOL TARTRATE 5 MG/5ML IV SOLN
5.0000 mg | Freq: Once | INTRAVENOUS | Status: AC
  Administered 2016-09-13: 5 mg via INTRAVENOUS

## 2016-09-13 MED ORDER — MIDAZOLAM HCL 2 MG/2ML IJ SOLN
INTRAMUSCULAR | Status: DC | PRN
  Administered 2016-09-13: 2 mg via INTRAVENOUS

## 2016-09-13 MED ORDER — ROCURONIUM BROMIDE 100 MG/10ML IV SOLN
INTRAVENOUS | Status: DC | PRN
  Administered 2016-09-13: 50 mg via INTRAVENOUS

## 2016-09-13 MED ORDER — CEFAZOLIN SODIUM-DEXTROSE 2-4 GM/100ML-% IV SOLN
2.0000 g | INTRAVENOUS | Status: AC
Start: 2016-09-13 — End: 2016-09-13
  Administered 2016-09-13: 2 g via INTRAVENOUS

## 2016-09-13 MED ORDER — IPRATROPIUM-ALBUTEROL 0.5-2.5 (3) MG/3ML IN SOLN
3.0000 mL | Freq: Four times a day (QID) | RESPIRATORY_TRACT | Status: DC
  Administered 2016-09-13 (×2): 3 mL via RESPIRATORY_TRACT
  Filled 2016-09-13 (×3): qty 3

## 2016-09-13 MED ORDER — ACETAMINOPHEN 650 MG RE SUPP: 650.0000 mg | Freq: Four times a day (QID) | RECTAL | Status: DC | PRN

## 2016-09-13 MED ORDER — FENTANYL CITRATE (PF) 100 MCG/2ML IJ SOLN
INTRAMUSCULAR | Status: DC | PRN
  Administered 2016-09-13: 100 ug via INTRAVENOUS
  Administered 2016-09-13 (×2): 50 ug via INTRAVENOUS

## 2016-09-13 MED ORDER — SODIUM CHLORIDE 0.9 % IV SOLN
Freq: Once | INTRAVENOUS | Status: AC
  Administered 2016-09-13: 04:00:00 via INTRAVENOUS

## 2016-09-13 MED ORDER — 0.9 % SODIUM CHLORIDE (POUR BTL) OPTIME
TOPICAL | Status: DC | PRN
  Administered 2016-09-13: 1000 mL

## 2016-09-13 MED ORDER — SUGAMMADEX SODIUM 200 MG/2ML IV SOLN
INTRAVENOUS | Status: AC
  Filled 2016-09-13: qty 2

## 2016-09-13 MED ORDER — LIDOCAINE 2% (20 MG/ML) 5 ML SYRINGE
INTRAMUSCULAR | Status: DC | PRN
  Administered 2016-09-13: 60 mg via INTRAVENOUS

## 2016-09-13 MED ORDER — SODIUM CHLORIDE 0.9 % IV SOLN
INTRAVENOUS | Status: DC
  Administered 2016-09-13 – 2016-09-15 (×4): via INTRAVENOUS

## 2016-09-13 MED ORDER — SODIUM CHLORIDE 0.9 % IV SOLN: INTRAVENOUS | Status: DC

## 2016-09-13 MED ORDER — PROPOFOL 10 MG/ML IV BOLUS
INTRAVENOUS | Status: DC | PRN
  Administered 2016-09-13: 160 mg via INTRAVENOUS

## 2016-09-13 MED ORDER — ROCURONIUM BROMIDE 50 MG/5ML IV SOSY
PREFILLED_SYRINGE | INTRAVENOUS | Status: AC
  Filled 2016-09-13: qty 10

## 2016-09-13 MED ORDER — ACETAMINOPHEN 325 MG PO TABS: 650.0000 mg | ORAL_TABLET | Freq: Four times a day (QID) | ORAL | Status: DC | PRN

## 2016-09-13 MED ORDER — ACETAMINOPHEN 325 MG PO TABS
650.0000 mg | ORAL_TABLET | Freq: Once | ORAL | Status: AC
  Administered 2016-09-13: 650 mg via ORAL
  Filled 2016-09-13: qty 2

## 2016-09-13 SURGICAL SUPPLY — 63 items
BANDAGE ELASTIC 4 VELCRO ST LF (GAUZE/BANDAGES/DRESSINGS) ×3 IMPLANT
BIT DRILL 4.3 (BIT) ×2
BIT DRILL 4.3MM (BIT) ×1
BIT DRILL 4.3X300MM (BIT) ×1 IMPLANT
BNDG COHESIVE 6X5 TAN STRL LF (GAUZE/BANDAGES/DRESSINGS) IMPLANT
BNDG ELASTIC 6X10 VLCR STRL LF (GAUZE/BANDAGES/DRESSINGS) ×3 IMPLANT
BRUSH SCRUB DISP (MISCELLANEOUS) ×6 IMPLANT
CANISTER SUCTION 2500CC (MISCELLANEOUS) ×3 IMPLANT
CAP LOCK NCB (Cap) ×24 IMPLANT
COVER PERINEAL POST (MISCELLANEOUS) IMPLANT
COVER SURGICAL LIGHT HANDLE (MISCELLANEOUS) ×3 IMPLANT
DRAPE C-ARMOR (DRAPES) ×3 IMPLANT
DRAPE ORTHO SPLIT 77X108 STRL (DRAPES) ×6
DRAPE PROXIMA HALF (DRAPES) ×3 IMPLANT
DRAPE SURG ORHT 6 SPLT 77X108 (DRAPES) ×3 IMPLANT
DRAPE U-SHAPE 47X51 STRL (DRAPES) ×3 IMPLANT
DRSG ADAPTIC 3X8 NADH LF (GAUZE/BANDAGES/DRESSINGS) ×3 IMPLANT
DRSG MEPILEX BORDER 4X12 (GAUZE/BANDAGES/DRESSINGS) ×3 IMPLANT
DRSG MEPILEX BORDER 4X4 (GAUZE/BANDAGES/DRESSINGS) ×3 IMPLANT
DRSG MEPILEX BORDER 4X8 (GAUZE/BANDAGES/DRESSINGS) IMPLANT
DRSG PAD ABDOMINAL 8X10 ST (GAUZE/BANDAGES/DRESSINGS) ×3 IMPLANT
ELECT REM PT RETURN 9FT ADLT (ELECTROSURGICAL) ×3
ELECTRODE REM PT RTRN 9FT ADLT (ELECTROSURGICAL) ×1 IMPLANT
GAUZE SPONGE 4X4 12PLY STRL (GAUZE/BANDAGES/DRESSINGS) ×3 IMPLANT
GLOVE BIO SURGEON STRL SZ7.5 (GLOVE) ×3 IMPLANT
GLOVE BIO SURGEON STRL SZ8 (GLOVE) ×3 IMPLANT
GLOVE BIOGEL PI IND STRL 7.5 (GLOVE) ×1 IMPLANT
GLOVE BIOGEL PI IND STRL 8 (GLOVE) ×1 IMPLANT
GLOVE BIOGEL PI INDICATOR 7.5 (GLOVE) ×2
GLOVE BIOGEL PI INDICATOR 8 (GLOVE) ×2
GOWN STRL REUS W/ TWL LRG LVL3 (GOWN DISPOSABLE) ×2 IMPLANT
GOWN STRL REUS W/ TWL XL LVL3 (GOWN DISPOSABLE) ×1 IMPLANT
GOWN STRL REUS W/TWL LRG LVL3 (GOWN DISPOSABLE) ×4
GOWN STRL REUS W/TWL XL LVL3 (GOWN DISPOSABLE) ×2
K-WIRE 2.0 (WIRE) ×2
K-WIRE FXSTD 280X2XNS SS (WIRE) ×1
KIT BASIN OR (CUSTOM PROCEDURE TRAY) ×3 IMPLANT
KIT ROOM TURNOVER OR (KITS) ×3 IMPLANT
KWIRE FXSTD 280X2XNS SS (WIRE) ×1 IMPLANT
MANIFOLD NEPTUNE II (INSTRUMENTS) IMPLANT
NS IRRIG 1000ML POUR BTL (IV SOLUTION) ×3 IMPLANT
PACK GENERAL/GYN (CUSTOM PROCEDURE TRAY) ×3 IMPLANT
PAD ARMBOARD 7.5X6 YLW CONV (MISCELLANEOUS) ×6 IMPLANT
PLATE NCB 15H HIP (Plate) ×3 IMPLANT
SCREW 5.0 80MM (Screw) ×12 IMPLANT
SCREW NCB 3.5X75X5X6.2XST (Screw) ×2 IMPLANT
SCREW NCB 5.0X34MM (Screw) ×12 IMPLANT
SCREW NCB 5.0X75MM (Screw) ×4 IMPLANT
STAPLER VISISTAT 35W (STAPLE) ×3 IMPLANT
STOCKINETTE IMPERVIOUS LG (DRAPES) ×3 IMPLANT
SUT ETHILON 3 0 PS 1 (SUTURE) ×9 IMPLANT
SUT VIC AB 0 CT1 27 (SUTURE)
SUT VIC AB 0 CT1 27XBRD ANBCTR (SUTURE) IMPLANT
SUT VIC AB 1 CT1 27 (SUTURE) ×2
SUT VIC AB 1 CT1 27XBRD ANBCTR (SUTURE) ×1 IMPLANT
SUT VIC AB 2-0 CT1 27 (SUTURE) ×2
SUT VIC AB 2-0 CT1 TAPERPNT 27 (SUTURE) ×1 IMPLANT
TOWEL OR 17X24 6PK STRL BLUE (TOWEL DISPOSABLE) ×3 IMPLANT
TOWEL OR 17X26 10 PK STRL BLUE (TOWEL DISPOSABLE) IMPLANT
TUBE CONNECTING 12'X1/4 (SUCTIONS) ×1
TUBE CONNECTING 12X1/4 (SUCTIONS) ×2 IMPLANT
WATER STERILE IRR 1000ML POUR (IV SOLUTION) IMPLANT
YANKAUER SUCT BULB TIP NO VENT (SUCTIONS) ×3 IMPLANT
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
## 2016-09-13 NOTE — Progress Notes (Signed)
Called Dr Ninfa Linden about HGB 7.5 drop from 10.4/11.6. He recommended 2 units PRBCs. Will place verbal order and continue to monitor patient.

## 2016-09-13 NOTE — Anesthesia Procedure Notes (Signed)
Procedure Name: Intubation Date/Time: 09/13/2016 3:37 PM Performed by: Trixie Deis A Pre-anesthesia Checklist: Patient identified, Emergency Drugs available, Suction available and Patient being monitored Patient Re-evaluated:Patient Re-evaluated prior to inductionOxygen Delivery Method: Circle System Utilized Preoxygenation: Pre-oxygenation with 100% oxygen Intubation Type: IV induction Ventilation: Mask ventilation without difficulty Laryngoscope Size: Mac and 3 Grade View: Grade I Tube type: Oral Tube size: 7.5 mm Number of attempts: 1 Airway Equipment and Method: Stylet and Oral airway Placement Confirmation: ETT inserted through vocal cords under direct vision,  positive ETCO2 and breath sounds checked- equal and bilateral Secured at: 21 cm Tube secured with: Tape Dental Injury: Teeth and Oropharynx as per pre-operative assessment

## 2016-09-13 NOTE — Anesthesia Preprocedure Evaluation (Addendum)
Anesthesia Evaluation  Patient identified by MRN, date of birth, ID band Patient awake    Reviewed: Allergy & Precautions, NPO status , Patient's Chart, lab work & pertinent test results  Airway Mallampati: II  TM Distance: >3 FB     Dental   Pulmonary former smoker,    breath sounds clear to auscultation       Cardiovascular hypertension,  Rhythm:Regular Rate:Normal     Neuro/Psych    GI/Hepatic negative GI ROS, Neg liver ROS,   Endo/Other  diabetes  Renal/GU negative Renal ROS     Musculoskeletal negative musculoskeletal ROS (+)   Abdominal   Peds  Hematology   Anesthesia Other Findings   Reproductive/Obstetrics                             Anesthesia Physical Anesthesia Plan  ASA: III  Anesthesia Plan: General   Post-op Pain Management:    Induction: Intravenous  Airway Management Planned: Oral ETT  Additional Equipment:   Intra-op Plan:   Post-operative Plan: Possible Post-op intubation/ventilation  Informed Consent: I have reviewed the patients History and Physical, chart, labs and discussed the procedure including the risks, benefits and alternatives for the proposed anesthesia with the patient or authorized representative who has indicated his/her understanding and acceptance.   Dental advisory given  Plan Discussed with: CRNA and Anesthesiologist  Anesthesia Plan Comments:         Anesthesia Quick Evaluation
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
## 2016-09-13 NOTE — Transfer of Care (Signed)
Immediate Anesthesia Transfer of Care Note  Patient: Barbara Matthews  Procedure(s) Performed: Procedure(s): Open Reduction Internal Fixation Left Distal Femur (Left)  Patient Location: PACU  Anesthesia Type:General  Level of Consciousness: sedated  Airway & Oxygen Therapy: Patient Spontanous Breathing and Patient connected to nasal cannula oxygen  Post-op Assessment: Report given to RN, Post -op Vital signs reviewed and stable and Patient moving all extremities  Post vital signs: Reviewed and stable  Last Vitals:  Vitals:   09/13/16 1300 09/13/16 1808  BP: (!) 146/70 (!) (P) 145/71  Pulse: (!) 127 (!) (P) 123  Resp: 18 (P) 18  Temp:  36.5 C    Last Pain:  Vitals:   09/13/16 1200  TempSrc: Oral  PainSc:          Complications: No apparent anesthesia complications

## 2016-09-14 ENCOUNTER — Encounter (HOSPITAL_COMMUNITY): Payer: Self-pay | Admitting: Orthopedic Surgery

## 2016-09-14 ENCOUNTER — Inpatient Hospital Stay (HOSPITAL_COMMUNITY): Payer: BC Managed Care – PPO

## 2016-09-14 LAB — BASIC METABOLIC PANEL
Anion gap: 10 (ref 5–15)
BUN: 19 mg/dL (ref 6–20)
CHLORIDE: 108 mmol/L (ref 101–111)
CO2: 25 mmol/L (ref 22–32)
Calcium: 9 mg/dL (ref 8.9–10.3)
Creatinine, Ser: 0.92 mg/dL (ref 0.44–1.00)
GFR calc non Af Amer: >60 mL/min (ref >60)
Glucose, Bld: 126 mg/dL — ABNORMAL HIGH (ref 65–99)
POTASSIUM: 4 mmol/L (ref 3.5–5.1)
SODIUM: 143 mmol/L (ref 135–145)

## 2016-09-14 LAB — GLUCOSE, CAPILLARY
GLUCOSE-CAPILLARY: 153 mg/dL — AB (ref 65–99)
Glucose-Capillary: 117 mg/dL — ABNORMAL HIGH (ref 65–99)
Glucose-Capillary: 120 mg/dL — ABNORMAL HIGH (ref 65–99)
Glucose-Capillary: 187 mg/dL — ABNORMAL HIGH (ref 65–99)
Glucose-Capillary: 273 mg/dL — ABNORMAL HIGH (ref 65–99)
Glucose-Capillary: 303 mg/dL — ABNORMAL HIGH (ref 65–99)

## 2016-09-14 LAB — CBC
HEMATOCRIT: 31.2 % — AB (ref 36.0–46.0)
HEMOGLOBIN: 10.4 g/dL — AB (ref 12.0–15.0)
MCH: 26.5 pg (ref 26.0–34.0)
MCHC: 33.3 g/dL (ref 30.0–36.0)
MCV: 79.6 fL (ref 78.0–100.0)
Platelets: 55 10*3/uL — ABNORMAL LOW (ref 150–400)
RBC: 3.92 MIL/uL (ref 3.87–5.11)
RDW: 17.9 % — ABNORMAL HIGH (ref 11.5–15.5)
WBC: 13.3 10*3/uL — ABNORMAL HIGH (ref 4.0–10.5)

## 2016-09-14 LAB — PHOSPHORUS: PHOSPHORUS: 2.8 mg/dL (ref 2.5–4.6)

## 2016-09-14 LAB — MAGNESIUM: MAGNESIUM: 1.8 mg/dL (ref 1.7–2.4)

## 2016-09-14 MED ORDER — OXYCODONE HCL 5 MG PO TABS
5.0000 mg | ORAL_TABLET | ORAL | Status: DC | PRN
  Administered 2016-09-14: 5 mg via ORAL
  Administered 2016-09-15 – 2016-09-17 (×2): 10 mg via ORAL
  Filled 2016-09-14: qty 2
  Filled 2016-09-14: qty 1
  Filled 2016-09-14: qty 2

## 2016-09-14 MED ORDER — SODIUM CHLORIDE 0.9% FLUSH: 10.0000 mL | INTRAVENOUS | Status: DC | PRN

## 2016-09-14 MED ORDER — AMLODIPINE BESYLATE 10 MG PO TABS
10.0000 mg | ORAL_TABLET | Freq: Every day | ORAL | Status: DC
  Administered 2016-09-14 – 2016-09-17 (×4): 10 mg via ORAL
  Filled 2016-09-14 (×3): qty 1
  Filled 2016-09-14: qty 2

## 2016-09-14 MED ORDER — INSULIN ASPART 100 UNIT/ML ~~LOC~~ SOLN
0.0000 [IU] | SUBCUTANEOUS | Status: AC
  Administered 2016-09-14: 8 [IU] via SUBCUTANEOUS
  Administered 2016-09-14: 3 [IU] via SUBCUTANEOUS
  Administered 2016-09-15 (×2): 8 [IU] via SUBCUTANEOUS
  Administered 2016-09-15: 11 [IU] via SUBCUTANEOUS

## 2016-09-14 MED ORDER — IPRATROPIUM-ALBUTEROL 0.5-2.5 (3) MG/3ML IN SOLN
3.0000 mL | Freq: Three times a day (TID) | RESPIRATORY_TRACT | Status: DC
  Administered 2016-09-14 – 2016-09-15 (×4): 3 mL via RESPIRATORY_TRACT
  Filled 2016-09-14 (×5): qty 3

## 2016-09-14 MED ORDER — CHLORHEXIDINE GLUCONATE CLOTH 2 % EX PADS
6.0000 | MEDICATED_PAD | Freq: Every day | CUTANEOUS | Status: DC
Start: 2016-09-14 — End: 2016-09-14

## 2016-09-14 MED ORDER — CHLORHEXIDINE GLUCONATE CLOTH 2 % EX PADS
6.0000 | MEDICATED_PAD | Freq: Every day | CUTANEOUS | Status: DC
  Administered 2016-09-14 – 2016-09-15 (×2): 6 via TOPICAL

## 2016-09-14 MED ORDER — BETHANECHOL CHLORIDE 25 MG PO TABS
25.0000 mg | ORAL_TABLET | Freq: Three times a day (TID) | ORAL | Status: DC
  Filled 2016-09-14 (×2): qty 1

## 2016-09-14 MED ORDER — METOPROLOL TARTRATE 25 MG PO TABS
25.0000 mg | ORAL_TABLET | Freq: Two times a day (BID) | ORAL | Status: DC
  Administered 2016-09-14 – 2016-09-17 (×7): 25 mg via ORAL
  Filled 2016-09-14 (×7): qty 1

## 2016-09-14 MED ORDER — METOPROLOL TARTRATE 5 MG/5ML IV SOLN
5.0000 mg | Freq: Four times a day (QID) | INTRAVENOUS | Status: DC
  Administered 2016-09-14: 5 mg via INTRAVENOUS
  Filled 2016-09-14 (×2): qty 5

## 2016-09-14 MED ORDER — TRAMADOL HCL 50 MG PO TABS
50.0000 mg | ORAL_TABLET | Freq: Four times a day (QID) | ORAL | Status: DC
  Administered 2016-09-14 – 2016-09-17 (×12): 50 mg via ORAL
  Filled 2016-09-14 (×13): qty 1

## 2016-09-14 MED ORDER — TAMSULOSIN HCL 0.4 MG PO CAPS: 0.4000 mg | ORAL_CAPSULE | Freq: Every day | ORAL | Status: DC

## 2016-09-14 MED ORDER — M.V.I. ADULT IV INJ
INJECTION | INTRAVENOUS | Status: AC
  Administered 2016-09-14: 18:00:00 via INTRAVENOUS
  Filled 2016-09-14: qty 2000

## 2016-09-14 MED ORDER — METOPROLOL TARTRATE 5 MG/5ML IV SOLN
10.0000 mg | INTRAVENOUS | Status: DC | PRN
  Administered 2016-09-14 – 2016-09-15 (×2): 10 mg via INTRAVENOUS
  Filled 2016-09-14 (×2): qty 10
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
## 2016-09-14 NOTE — Anesthesia Postprocedure Evaluation (Signed)
Anesthesia Post Note  Patient: Fish farm manager) Performed: Procedure(s) (LRB): Open Reduction Internal Fixation Left Distal Femur (Left)  Patient location during evaluation: PACU Anesthesia Type: General Level of consciousness: awake and alert Pain management: pain level controlled Vital Signs Assessment: post-procedure vital signs reviewed and stable Respiratory status: spontaneous breathing, nonlabored ventilation, respiratory function stable and patient connected to nasal cannula oxygen Cardiovascular status: blood pressure returned to baseline and stable Postop Assessment: no signs of nausea or vomiting Anesthetic complications: no       Last Vitals:  Vitals:   09/14/16 0615 09/14/16 0700  BP: (!) 156/78 (!) 166/83  Pulse: (!) 110 (!) 120  Resp: 15 20  Temp:      Last Pain:  Vitals:   09/14/16 0530  TempSrc:   PainSc: 3                  Kesia Dalto,W. EDMOND
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
## 2016-09-14 NOTE — Progress Notes (Signed)
Patient heart rate sustaining in 130s. Byerly MD paged X 4 starting at 0430AM. Patient continued to sustain HR in 130s and BP >160/80 with several runs of VTach. Order received at 0600AM for 5 mg Metoprolol Q6hrs. Order placed, HR now 110s, BP 156/78. Will continue to monitor patient.

## 2016-09-15 LAB — COMPREHENSIVE METABOLIC PANEL
ALT: 35 U/L (ref 14–54)
ANION GAP: 9 (ref 5–15)
AST: 18 U/L (ref 15–41)
Albumin: 2.4 g/dL — ABNORMAL LOW (ref 3.5–5.0)
Alkaline Phosphatase: 72 U/L (ref 38–126)
BILIRUBIN TOTAL: 1.4 mg/dL — AB (ref 0.3–1.2)
BUN: 16 mg/dL (ref 6–20)
CHLORIDE: 101 mmol/L (ref 101–111)
CO2: 26 mmol/L (ref 22–32)
Calcium: 9 mg/dL (ref 8.9–10.3)
Creatinine, Ser: 0.68 mg/dL (ref 0.44–1.00)
GFR calc Af Amer: >60 mL/min (ref >60)
Glucose, Bld: 273 mg/dL — ABNORMAL HIGH (ref 65–99)
POTASSIUM: 3.8 mmol/L (ref 3.5–5.1)
Sodium: 136 mmol/L (ref 135–145)
TOTAL PROTEIN: 5.1 g/dL — AB (ref 6.5–8.1)

## 2016-09-15 LAB — DIFFERENTIAL
BASOS PCT: 0 %
Basophils Absolute: 0 10*3/uL (ref 0.0–0.1)
EOS ABS: 0 10*3/uL (ref 0.0–0.7)
Eosinophils Relative: 0 %
LYMPHS ABS: 1.7 10*3/uL (ref 0.7–4.0)
Lymphocytes Relative: 12 %
MONO ABS: 1.4 10*3/uL — AB (ref 0.1–1.0)
Monocytes Relative: 10 %
NEUTROS ABS: 10.7 10*3/uL — AB (ref 1.7–7.7)
Neutrophils Relative %: 78 %

## 2016-09-15 LAB — GLUCOSE, CAPILLARY
GLUCOSE-CAPILLARY: 229 mg/dL — AB (ref 65–99)
Glucose-Capillary: 258 mg/dL — ABNORMAL HIGH (ref 65–99)
Glucose-Capillary: 268 mg/dL — ABNORMAL HIGH (ref 65–99)
Glucose-Capillary: 206 mg/dL — ABNORMAL HIGH (ref 65–99)
Glucose-Capillary: 252 mg/dL — ABNORMAL HIGH (ref 65–99)

## 2016-09-15 LAB — MAGNESIUM: Magnesium: 1.7 mg/dL (ref 1.7–2.4)

## 2016-09-15 LAB — CBC
HEMATOCRIT: 28.4 % — AB (ref 36.0–46.0)
Hemoglobin: 9.3 g/dL — ABNORMAL LOW (ref 12.0–15.0)
MCH: 26.4 pg (ref 26.0–34.0)
MCHC: 32.7 g/dL (ref 30.0–36.0)
MCV: 80.7 fL (ref 78.0–100.0)
PLATELETS: 70 10*3/uL — AB (ref 150–400)
RBC: 3.52 MIL/uL — ABNORMAL LOW (ref 3.87–5.11)
RDW: 18.5 % — AB (ref 11.5–15.5)
WBC: 13.8 10*3/uL — ABNORMAL HIGH (ref 4.0–10.5)

## 2016-09-15 LAB — PREALBUMIN: PREALBUMIN: 11.8 mg/dL — AB (ref 18–38)

## 2016-09-15 LAB — TRIGLYCERIDES: TRIGLYCERIDES: 98 mg/dL (ref <150)

## 2016-09-15 LAB — PHOSPHORUS: PHOSPHORUS: 1.7 mg/dL — AB (ref 2.5–4.6)

## 2016-09-15 MED ORDER — INSULIN ASPART 100 UNIT/ML ~~LOC~~ SOLN
0.0000 [IU] | SUBCUTANEOUS | Status: DC
Start: 2016-09-15 — End: 2016-09-17
  Administered 2016-09-15: 7 [IU] via SUBCUTANEOUS
  Administered 2016-09-15: 11 [IU] via SUBCUTANEOUS
  Administered 2016-09-15 – 2016-09-16 (×2): 7 [IU] via SUBCUTANEOUS
  Administered 2016-09-16: 11 [IU] via SUBCUTANEOUS
  Administered 2016-09-16: 4 [IU] via SUBCUTANEOUS
  Administered 2016-09-16: 7 [IU] via SUBCUTANEOUS
  Administered 2016-09-16: 4 [IU] via SUBCUTANEOUS
  Administered 2016-09-16: 7 [IU] via SUBCUTANEOUS
  Administered 2016-09-17: 3 [IU] via SUBCUTANEOUS
  Administered 2016-09-17: 7 [IU] via SUBCUTANEOUS
  Administered 2016-09-17: 4 [IU] via SUBCUTANEOUS
  Administered 2016-09-17: 7 [IU] via SUBCUTANEOUS

## 2016-09-15 MED ORDER — ENSURE ENLIVE PO LIQD
237.0000 mL | Freq: Two times a day (BID) | ORAL | Status: DC
  Administered 2016-09-15 – 2016-09-17 (×4): 237 mL via ORAL

## 2016-09-15 MED ORDER — M.V.I. ADULT IV INJ
INTRAVENOUS | Status: AC
  Administered 2016-09-15: 18:00:00 via INTRAVENOUS
  Filled 2016-09-15: qty 2000

## 2016-09-15 MED ORDER — PANTOPRAZOLE SODIUM 40 MG PO TBEC
40.0000 mg | DELAYED_RELEASE_TABLET | Freq: Every day | ORAL | Status: DC
  Administered 2016-09-15 – 2016-09-17 (×3): 40 mg via ORAL
  Filled 2016-09-15 (×3): qty 1

## 2016-09-15 MED ORDER — REGORAFENIB 40 MG PO TABS: 80.0000 mg | ORAL_TABLET | Freq: Every day | ORAL | Status: DC

## 2016-09-15 MED ORDER — LOPERAMIDE HCL 2 MG PO CAPS
4.0000 mg | ORAL_CAPSULE | Freq: Four times a day (QID) | ORAL | Status: DC
  Administered 2016-09-15 – 2016-09-17 (×8): 4 mg via ORAL
  Filled 2016-09-15 (×8): qty 2

## 2016-09-15 MED ORDER — IPRATROPIUM-ALBUTEROL 0.5-2.5 (3) MG/3ML IN SOLN: 3.0000 mL | RESPIRATORY_TRACT | Status: DC | PRN

## 2016-09-15 MED ORDER — DIPHENOXYLATE-ATROPINE 2.5-0.025 MG PO TABS
1.0000 | ORAL_TABLET | ORAL | Status: DC
  Administered 2016-09-15 – 2016-09-17 (×12): 1 via ORAL
  Filled 2016-09-15 (×13): qty 1

## 2016-09-15 MED ORDER — SODIUM BICARBONATE 650 MG PO TABS
650.0000 mg | ORAL_TABLET | Freq: Three times a day (TID) | ORAL | Status: DC
  Administered 2016-09-15 – 2016-09-17 (×6): 650 mg via ORAL
  Filled 2016-09-15 (×6): qty 1

## 2016-09-15 MED ORDER — METOPROLOL TARTRATE 25 MG PO TABS: 25.0000 mg | ORAL_TABLET | Freq: Two times a day (BID) | ORAL | Status: DC

## 2016-09-15 MED ORDER — GLIPIZIDE 5 MG PO TABS
20.0000 mg | ORAL_TABLET | Freq: Every day | ORAL | Status: DC
  Administered 2016-09-16 – 2016-09-17 (×2): 20 mg via ORAL
  Filled 2016-09-15 (×2): qty 4

## 2016-09-15 MED ORDER — FAT EMULSION 20 % IV EMUL
240.0000 mL | INTRAVENOUS | Status: AC
  Administered 2016-09-15: 240 mL via INTRAVENOUS
  Filled 2016-09-15: qty 250

## 2016-09-15 MED ORDER — SODIUM PHOSPHATES 45 MMOLE/15ML IV SOLN
20.0000 mmol | Freq: Once | INTRAVENOUS | Status: AC
  Administered 2016-09-15: 20 mmol via INTRAVENOUS
  Filled 2016-09-15: qty 6.67

## 2016-09-15 MED ORDER — HYDROMORPHONE HCL 2 MG/ML IJ SOLN
1.0000 mg | INTRAMUSCULAR | Status: DC | PRN
  Administered 2016-09-16: 1 mg via INTRAVENOUS
  Filled 2016-09-15: qty 1

## 2016-09-15 NOTE — Progress Notes (Signed)
Trauma Service Note  Subjective: Patient awake and alert.  Minimal complaints.  Objective: Vital signs in last 24 hours: Temp:  [98 F (36.7 C)-100 F (37.8 C)] 98.6 F (37 C) (02/07 0400) Pulse Rate:  [105-140] 124 (02/07 0759) Resp:  [0-23] 19 (02/07 0759) BP: (137-179)/(65-89) 176/80 (02/07 0700) SpO2:  [96 %-100 %] 98 % (02/07 0700) Last BM Date: 09/14/16  Intake/Output from previous day: 02/06 0701 - 02/07 0700 In: 2232.5 [P.O.:600; I.V.:1632.5] Out: 2550 [Urine:2200; Stool:350] Intake/Output this shift: No intake/output data recorded.  General: No acute distress.  Has not been out of bed.  Lungs: Clear to auscultation.  Abd: Benign  Extremities: Left leg well perfused.  Elevated  Neuro: Intact  Lab Results: CBC   Recent Labs  09/14/16 0254 09/15/16 0338  WBC 13.3* 13.8*  HGB 10.4* 9.3*  HCT 31.2* 28.4*  PLT 55* 70*   BMET  Recent Labs  09/14/16 0254 09/15/16 0338  NA 143 136  K 4.0 3.8  CL 108 101  CO2 25 26  GLUCOSE 126* 273*  BUN 19 16  CREATININE 0.92 0.68  CALCIUM 9.0 9.0   PT/INR  Recent Labs  09/12/16 1605  LABPROT 14.0  INR 1.08   ABG No results for input(s): PHART, HCO3 in the last 72 hours.  Invalid input(s): PCO2, PO2  Studies/Results: Dg Chest Port 1 View  Result Date: 09/14/2016 CLINICAL DATA:  Rib fractures.  Rectal cancer. EXAM: PORTABLE CHEST 1 VIEW COMPARISON:  09/13/2016. FINDINGS: Right-sided dialysis catheter is unchanged in position. Midline trachea. Borderline cardiomegaly. No pleural effusion or pneumothorax. No congestive failure. Mild right base opacity likely corresponds to pulmonary contusion on prior CT. No new airspace opacity. IMPRESSION: No pneumothorax. Similar mild right base opacity. Electronically Signed   By: Abigail Miyamoto M.D.   On: 09/14/2016 07:01   Dg Knee Left Port  Result Date: 09/13/2016 CLINICAL DATA:  61 y/o  F; status post surgery for knee fracture. EXAM: PORTABLE LEFT KNEE - 1-2 VIEW  COMPARISON:  09/12/2016 CT of the left knee. FINDINGS: Interval placement of a lateral fixation plate along the femoral shaft to the lateral condyles fixed by multiple transversely oriented screws. Stable comminuted mildly displaced fracture of the distal femoral diaphysis and metaphysis extending into the intercondylar notch and medial condyle with improved alignment post fixation. No new fracture identified. IMPRESSION: Interval surgical fixation of comminuted distal femoral fracture with improved angulation and mild persistent displacement. Electronically Signed   By: Kristine Garbe M.D.   On: 09/13/2016 22:43   Dg C-arm 61-120 Min  Result Date: 09/13/2016 CLINICAL DATA:  ORIF of a severely comminuted fracture involving the distal left femur. EXAM: Operative LEFT FEMUR 2 VIEWS COMPARISON:  09/12/2016. FINDINGS: Five spot images from the C-arm fluoroscopic device, AP and lateral views of the left femur are submitted for interpretation postoperatively. ORIF of the comminuted distal left femur fracture with plate and screw fixation. Alignment of the multipart fracture appears near anatomic. IMPRESSION: Near anatomic alignment post ORIF of the severely comminuted distal left femur fracture. Electronically Signed   By: Evangeline Dakin M.D.   On: 09/13/2016 17:42   Dg Femur Min 2 Views Left  Result Date: 09/13/2016 CLINICAL DATA:  ORIF of a severely comminuted fracture involving the distal left femur. EXAM: Operative LEFT FEMUR 2 VIEWS COMPARISON:  09/12/2016. FINDINGS: Five spot images from the C-arm fluoroscopic device, AP and lateral views of the left femur are submitted for interpretation postoperatively. ORIF of the comminuted distal left  femur fracture with plate and screw fixation. Alignment of the multipart fracture appears near anatomic. IMPRESSION: Near anatomic alignment post ORIF of the severely comminuted distal left femur fracture. Electronically Signed   By: Evangeline Dakin M.D.   On:  09/13/2016 17:42    Anti-infectives: Anti-infectives    Start     Dose/Rate Route Frequency Ordered Stop   09/13/16 1900  ceFAZolin (ANCEF) IVPB 1 g/50 mL premix     1 g 100 mL/hr over 30 Minutes Intravenous Every 6 hours 09/13/16 1857 09/14/16 0637   09/13/16 1430  ceFAZolin (ANCEF) IVPB 2g/100 mL premix     2 g 200 mL/hr over 30 Minutes Intravenous To Short Stay 09/13/16 0949 09/13/16 1521   09/13/16 0600  ceFAZolin (ANCEF) IVPB 2g/100 mL premix  Status:  Discontinued     2 g 200 mL/hr over 30 Minutes Intravenous On call to O.R. 09/12/16 2118 09/13/16 1851      Assessment/Plan: s/p Procedure(s): Open Reduction Internal Fixation Left Distal Femur Transfer to the floor.  PT/OT  LOS: 3 days   Kathryne Eriksson. Dahlia Bailiff, MD, FACS [phone] Trauma Surgeon 09/15/2016
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
## 2016-09-15 NOTE — Consult Note (Addendum)
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   Stomal assessment/size: Pouch is intact with good seal; high output spout applied to bedside drainage bag with large amt liquid brown stool. Ostomy pouching: 2pc. Hollister convex pouching system with a belt and high output opening at the bottom, patient uses paper tape around the barrier to secure in place. Education provided: Bedside nurses can assist patient with changing ostomy pouch PRN.  She has her own supplies at the bedside and states she is independent with pouch application and emptying when at home. She denies further questions or need for assistance at this time. Please re-consult if further assistance is needed.  Thank-you,  Julien Girt MSN, [place], [place], [place], [place]

## 2016-09-15 NOTE — Progress Notes (Signed)
Rehab Admissions Coordinator Note:  Patient was screened by Cleatrice Burke for appropriateness for an Inpatient Acute Rehab Consult per OT recommendation.  At this time, we are recommending Inpatient Rehab consult.  Cleatrice Burke 09/15/2016, 4:01 PM  I can be reached at [phone].

## 2016-09-16 DIAGNOSIS — T07XXXA Unspecified multiple injuries, initial encounter: Secondary | ICD-10-CM

## 2016-09-16 DIAGNOSIS — S72492S Other fracture of lower end of left femur, sequela: Secondary | ICD-10-CM

## 2016-09-16 LAB — COMPREHENSIVE METABOLIC PANEL
ALT: 22 U/L (ref 14–54)
ANION GAP: 7 (ref 5–15)
AST: 14 U/L — ABNORMAL LOW (ref 15–41)
Albumin: 2 g/dL — ABNORMAL LOW (ref 3.5–5.0)
Alkaline Phosphatase: 61 U/L (ref 38–126)
BUN: 23 mg/dL — ABNORMAL HIGH (ref 6–20)
CALCIUM: 8.7 mg/dL — AB (ref 8.9–10.3)
CHLORIDE: 100 mmol/L — AB (ref 101–111)
CO2: 28 mmol/L (ref 22–32)
CREATININE: 0.75 mg/dL (ref 0.44–1.00)
Glucose, Bld: 250 mg/dL — ABNORMAL HIGH (ref 65–99)
Potassium: 3.8 mmol/L (ref 3.5–5.1)
SODIUM: 135 mmol/L (ref 135–145)
Total Bilirubin: 1 mg/dL (ref 0.3–1.2)
Total Protein: 4.9 g/dL — ABNORMAL LOW (ref 6.5–8.1)

## 2016-09-16 LAB — GLUCOSE, CAPILLARY
GLUCOSE-CAPILLARY: 190 mg/dL — AB (ref 65–99)
GLUCOSE-CAPILLARY: 212 mg/dL — AB (ref 65–99)
GLUCOSE-CAPILLARY: 233 mg/dL — AB (ref 65–99)
GLUCOSE-CAPILLARY: 256 mg/dL — AB (ref 65–99)
Glucose-Capillary: 195 mg/dL — ABNORMAL HIGH (ref 65–99)
Glucose-Capillary: 238 mg/dL — ABNORMAL HIGH (ref 65–99)

## 2016-09-16 LAB — TYPE AND SCREEN
BLOOD PRODUCT EXPIRATION DATE: 201802192359
BLOOD PRODUCT EXPIRATION DATE: 201802202359
BLOOD PRODUCT EXPIRATION DATE: 201802222359
Blood Product Expiration Date: 201802192359
Blood Product Expiration Date: 201802202359
Blood Product Expiration Date: 201802222359
ISSUE DATE / TIME: 201802050327
ISSUE DATE / TIME: 201802050501
ISSUE DATE / TIME: 201802052036
ISSUE DATE / TIME: 201802052255
UNIT TYPE AND RH: 6200
UNIT TYPE AND RH: 6200
UNIT TYPE AND RH: 6200
UNIT TYPE AND RH: 6200
Unit Type and Rh: 6200
Unit Type and Rh: 6200

## 2016-09-16 LAB — PHOSPHORUS: PHOSPHORUS: 2.4 mg/dL — AB (ref 2.5–4.6)

## 2016-09-16 LAB — MAGNESIUM: MAGNESIUM: 1.8 mg/dL (ref 1.7–2.4)

## 2016-09-16 MED ORDER — TRACE MINERALS CR-CU-MN-SE-ZN 10-1000-500-60 MCG/ML IV SOLN
INTRAVENOUS | Status: AC
  Administered 2016-09-16: 17:00:00 via INTRAVENOUS
  Filled 2016-09-16: qty 2000
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
## 2016-09-16 NOTE — Progress Notes (Signed)
I met with pt at bedside and then contacted her daughter by phone with pt's permission. We discussed a possible inpt rehab admission pending insurance approval and bed availability. Pt and daughter to discuss today and we will follow up tomorrow. There are 15 steps to get into pt's second floor apartment and daughter plans to provide more assistance at home. [phone]
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
## 2016-09-17 ENCOUNTER — Inpatient Hospital Stay (HOSPITAL_COMMUNITY)
Admission: RE | Admit: 2016-09-17 | Payer: BC Managed Care – PPO | Source: Intra-hospital | Admitting: Physical Medicine & Rehabilitation

## 2016-09-17 ENCOUNTER — Encounter (HOSPITAL_COMMUNITY): Payer: Self-pay | Admitting: Orthopedic Surgery

## 2016-09-17 ENCOUNTER — Inpatient Hospital Stay (HOSPITAL_COMMUNITY)
Admission: RE | Admit: 2016-09-17 | Discharge: 2016-10-02 | DRG: 560 | Disposition: A | Discharge status: Home Health | Payer: BC Managed Care – PPO | Source: Intra-hospital | Attending: Physical Medicine & Rehabilitation | Admitting: Physical Medicine & Rehabilitation

## 2016-09-17 ENCOUNTER — Encounter (HOSPITAL_BASED_OUTPATIENT_CLINIC_OR_DEPARTMENT_OTHER): Payer: Self-pay | Admitting: *Deleted

## 2016-09-17 DIAGNOSIS — C210 Malignant neoplasm of anus, unspecified: Secondary | ICD-10-CM | POA: Diagnosis not present

## 2016-09-17 DIAGNOSIS — S2220XD Unspecified fracture of sternum, subsequent encounter for fracture with routine healing: Secondary | ICD-10-CM | POA: Diagnosis not present

## 2016-09-17 DIAGNOSIS — S37019D Minor contusion of unspecified kidney, subsequent encounter: Secondary | ICD-10-CM

## 2016-09-17 DIAGNOSIS — S72402A Unspecified fracture of lower end of left femur, initial encounter for closed fracture: Secondary | ICD-10-CM | POA: Diagnosis present

## 2016-09-17 DIAGNOSIS — Z933 Colostomy status: Secondary | ICD-10-CM

## 2016-09-17 DIAGNOSIS — S72402D Unspecified fracture of lower end of left femur, subsequent encounter for closed fracture with routine healing: Secondary | ICD-10-CM

## 2016-09-17 DIAGNOSIS — E119 Type 2 diabetes mellitus without complications: Secondary | ICD-10-CM

## 2016-09-17 DIAGNOSIS — Z433 Encounter for attention to colostomy: Secondary | ICD-10-CM | POA: Diagnosis not present

## 2016-09-17 DIAGNOSIS — E8809 Other disorders of plasma-protein metabolism, not elsewhere classified: Secondary | ICD-10-CM

## 2016-09-17 DIAGNOSIS — D638 Anemia in other chronic diseases classified elsewhere: Secondary | ICD-10-CM | POA: Diagnosis not present

## 2016-09-17 DIAGNOSIS — S2241XD Multiple fractures of ribs, right side, subsequent encounter for fracture with routine healing: Principal | ICD-10-CM

## 2016-09-17 DIAGNOSIS — E871 Hypo-osmolality and hyponatremia: Secondary | ICD-10-CM | POA: Diagnosis not present

## 2016-09-17 DIAGNOSIS — E11649 Type 2 diabetes mellitus with hypoglycemia without coma: Secondary | ICD-10-CM

## 2016-09-17 DIAGNOSIS — F4323 Adjustment disorder with mixed anxiety and depressed mood: Secondary | ICD-10-CM | POA: Diagnosis not present

## 2016-09-17 DIAGNOSIS — E46 Unspecified protein-calorie malnutrition: Secondary | ICD-10-CM | POA: Diagnosis not present

## 2016-09-17 DIAGNOSIS — E118 Type 2 diabetes mellitus with unspecified complications: Secondary | ICD-10-CM | POA: Diagnosis not present

## 2016-09-17 DIAGNOSIS — I10 Essential (primary) hypertension: Secondary | ICD-10-CM | POA: Diagnosis not present

## 2016-09-17 DIAGNOSIS — E1165 Type 2 diabetes mellitus with hyperglycemia: Secondary | ICD-10-CM

## 2016-09-17 DIAGNOSIS — IMO0002 Reserved for concepts with insufficient information to code with codable children: Secondary | ICD-10-CM | POA: Diagnosis present

## 2016-09-17 DIAGNOSIS — S72492S Other fracture of lower end of left femur, sequela: Secondary | ICD-10-CM | POA: Diagnosis not present

## 2016-09-17 DIAGNOSIS — M7989 Other specified soft tissue disorders: Secondary | ICD-10-CM | POA: Diagnosis not present

## 2016-09-17 DIAGNOSIS — D62 Acute posthemorrhagic anemia: Secondary | ICD-10-CM

## 2016-09-17 DIAGNOSIS — R509 Fever, unspecified: Secondary | ICD-10-CM | POA: Diagnosis not present

## 2016-09-17 DIAGNOSIS — Z6827 Body mass index (BMI) 27.0-27.9, adult: Secondary | ICD-10-CM | POA: Diagnosis not present

## 2016-09-17 DIAGNOSIS — S36113D Laceration of liver, unspecified degree, subsequent encounter: Secondary | ICD-10-CM | POA: Diagnosis not present

## 2016-09-17 DIAGNOSIS — T07XXXA Unspecified multiple injuries, initial encounter: Secondary | ICD-10-CM | POA: Diagnosis not present

## 2016-09-17 DIAGNOSIS — Z7984 Long term (current) use of oral hypoglycemic drugs: Secondary | ICD-10-CM

## 2016-09-17 DIAGNOSIS — C19 Malignant neoplasm of rectosigmoid junction: Secondary | ICD-10-CM | POA: Diagnosis not present

## 2016-09-17 DIAGNOSIS — Z87891 Personal history of nicotine dependence: Secondary | ICD-10-CM | POA: Diagnosis not present

## 2016-09-17 DIAGNOSIS — E1142 Type 2 diabetes mellitus with diabetic polyneuropathy: Secondary | ICD-10-CM | POA: Diagnosis not present

## 2016-09-17 DIAGNOSIS — Z79899 Other long term (current) drug therapy: Secondary | ICD-10-CM | POA: Diagnosis not present

## 2016-09-17 DIAGNOSIS — S7292XS Unspecified fracture of left femur, sequela: Secondary | ICD-10-CM | POA: Diagnosis not present

## 2016-09-17 DIAGNOSIS — S7292XA Unspecified fracture of left femur, initial encounter for closed fracture: Secondary | ICD-10-CM | POA: Diagnosis present

## 2016-09-17 DIAGNOSIS — Z789 Other specified health status: Secondary | ICD-10-CM

## 2016-09-17 LAB — GLUCOSE, CAPILLARY
GLUCOSE-CAPILLARY: 213 mg/dL — AB (ref 65–99)
GLUCOSE-CAPILLARY: 137 mg/dL — AB (ref 65–99)
GLUCOSE-CAPILLARY: 182 mg/dL — AB (ref 65–99)
GLUCOSE-CAPILLARY: 365 mg/dL — AB (ref 65–99)
Glucose-Capillary: 195 mg/dL — ABNORMAL HIGH (ref 65–99)
Glucose-Capillary: 224 mg/dL — ABNORMAL HIGH (ref 65–99)

## 2016-09-17 LAB — CBC
HCT: 24.8 % — ABNORMAL LOW (ref 36.0–46.0)
Hemoglobin: 7.9 g/dL — ABNORMAL LOW (ref 12.0–15.0)
MCH: 26.4 pg (ref 26.0–34.0)
MCHC: 31.9 g/dL (ref 30.0–36.0)
MCV: 82.9 fL (ref 78.0–100.0)
PLATELETS: 109 10*3/uL — AB (ref 150–400)
RBC: 2.99 MIL/uL — AB (ref 3.87–5.11)
RDW: 19.4 % — ABNORMAL HIGH (ref 11.5–15.5)
WBC: 9.4 10*3/uL (ref 4.0–10.5)

## 2016-09-17 LAB — PREPARE RBC (CROSSMATCH)

## 2016-09-17 MED ORDER — ACETAMINOPHEN 325 MG PO TABS
650.0000 mg | ORAL_TABLET | Freq: Once | ORAL | Status: AC
  Administered 2016-09-17: 650 mg via ORAL
  Filled 2016-09-17: qty 2

## 2016-09-17 MED ORDER — GLIPIZIDE 5 MG PO TABS
20.0000 mg | ORAL_TABLET | Freq: Every day | ORAL | Status: DC
  Administered 2016-09-18 – 2016-10-02 (×15): 20 mg via ORAL
  Filled 2016-09-17 (×15): qty 4

## 2016-09-17 MED ORDER — LOPERAMIDE HCL 2 MG PO CAPS
4.0000 mg | ORAL_CAPSULE | Freq: Four times a day (QID) | ORAL | Status: DC
  Administered 2016-09-17 – 2016-10-02 (×58): 4 mg via ORAL
  Filled 2016-09-17 (×60): qty 2

## 2016-09-17 MED ORDER — SODIUM CHLORIDE 0.9% FLUSH
10.0000 mL | INTRAVENOUS | Status: DC | PRN
  Administered 2016-09-25 – 2016-09-30 (×2): 10 mL
  Filled 2016-09-17 (×2): qty 40

## 2016-09-17 MED ORDER — FAT EMULSION 20 % IV EMUL
240.0000 mL | INTRAVENOUS | Status: AC
  Administered 2016-09-17: 240 mL via INTRAVENOUS
  Filled 2016-09-17: qty 250

## 2016-09-17 MED ORDER — FAT EMULSION 20 % IV EMUL
240.0000 mL | INTRAVENOUS | Status: DC
  Filled 2016-09-17: qty 250

## 2016-09-17 MED ORDER — SORBITOL 70 % SOLN
30.0000 mL | Freq: Every day | Status: DC | PRN
Start: 2016-09-17 — End: 2016-10-02
  Filled 2016-09-17: qty 30

## 2016-09-17 MED ORDER — SODIUM BICARBONATE 650 MG PO TABS
650.0000 mg | ORAL_TABLET | Freq: Three times a day (TID) | ORAL | Status: DC
  Administered 2016-09-17 – 2016-10-02 (×44): 650 mg via ORAL
  Filled 2016-09-17 (×44): qty 1

## 2016-09-17 MED ORDER — SODIUM CHLORIDE 0.9% FLUSH
10.0000 mL | INTRAVENOUS | Status: DC | PRN
  Administered 2016-09-18 – 2016-10-02 (×6): 10 mL
  Filled 2016-09-17 (×6): qty 40

## 2016-09-17 MED ORDER — ENSURE ENLIVE PO LIQD
237.0000 mL | Freq: Two times a day (BID) | ORAL | Status: DC
  Administered 2016-09-18 – 2016-10-02 (×21): 237 mL via ORAL

## 2016-09-17 MED ORDER — AMLODIPINE BESYLATE 10 MG PO TABS
10.0000 mg | ORAL_TABLET | Freq: Every day | ORAL | Status: DC
  Administered 2016-09-18 – 2016-10-02 (×15): 10 mg via ORAL
  Filled 2016-09-17 (×11): qty 1
  Filled 2016-09-17: qty 2
  Filled 2016-09-17 (×3): qty 1

## 2016-09-17 MED ORDER — ONDANSETRON HCL 4 MG/2ML IJ SOLN: 4.0000 mg | Freq: Four times a day (QID) | INTRAMUSCULAR | Status: DC | PRN

## 2016-09-17 MED ORDER — METOPROLOL TARTRATE 25 MG PO TABS
25.0000 mg | ORAL_TABLET | Freq: Two times a day (BID) | ORAL | Status: DC
  Administered 2016-09-17 – 2016-09-22 (×10): 25 mg via ORAL
  Filled 2016-09-17 (×10): qty 1

## 2016-09-17 MED ORDER — DIPHENOXYLATE-ATROPINE 2.5-0.025 MG PO TABS
1.0000 | ORAL_TABLET | ORAL | Status: DC
  Administered 2016-09-17 – 2016-10-02 (×83): 1 via ORAL
  Filled 2016-09-17 (×85): qty 1

## 2016-09-17 MED ORDER — ACETAMINOPHEN 650 MG RE SUPP: 650.0000 mg | Freq: Four times a day (QID) | RECTAL | Status: DC | PRN

## 2016-09-17 MED ORDER — SODIUM CHLORIDE 0.9 % IV SOLN
Freq: Once | INTRAVENOUS | Status: AC
  Administered 2016-09-17: 14:00:00 via INTRAVENOUS

## 2016-09-17 MED ORDER — TRACE MINERALS CR-CU-MN-SE-ZN 10-1000-500-60 MCG/ML IV SOLN
INTRAVENOUS | Status: DC
  Filled 2016-09-17 (×2): qty 2000

## 2016-09-17 MED ORDER — TRAMADOL HCL 50 MG PO TABS
50.0000 mg | ORAL_TABLET | Freq: Four times a day (QID) | ORAL | Status: DC
  Administered 2016-09-17 – 2016-10-02 (×53): 50 mg via ORAL
  Filled 2016-09-17 (×57): qty 1

## 2016-09-17 MED ORDER — IPRATROPIUM-ALBUTEROL 0.5-2.5 (3) MG/3ML IN SOLN: 3.0000 mL | RESPIRATORY_TRACT | Status: DC | PRN

## 2016-09-17 MED ORDER — ACETAMINOPHEN 325 MG PO TABS
650.0000 mg | ORAL_TABLET | Freq: Four times a day (QID) | ORAL | Status: DC | PRN
  Administered 2016-09-21 – 2016-09-28 (×6): 650 mg via ORAL
  Administered 2016-09-29 (×2): 325 mg via ORAL
  Filled 2016-09-17 (×7): qty 2

## 2016-09-17 MED ORDER — ONDANSETRON HCL 4 MG PO TABS: 4.0000 mg | ORAL_TABLET | Freq: Four times a day (QID) | ORAL | Status: DC | PRN

## 2016-09-17 MED ORDER — PANTOPRAZOLE SODIUM 40 MG PO TBEC
40.0000 mg | DELAYED_RELEASE_TABLET | Freq: Every day | ORAL | Status: DC
  Administered 2016-09-18 – 2016-10-02 (×15): 40 mg via ORAL
  Filled 2016-09-17: qty 1
  Filled 2016-09-17: qty 2
  Filled 2016-09-17 (×13): qty 1

## 2016-09-17 MED ORDER — OXYCODONE HCL 5 MG PO TABS
5.0000 mg | ORAL_TABLET | ORAL | Status: DC | PRN
  Administered 2016-09-17 – 2016-09-21 (×3): 10 mg via ORAL
  Filled 2016-09-17 (×4): qty 2

## 2016-09-17 MED ORDER — TRACE MINERALS CR-CU-MN-SE-ZN 10-1000-500-60 MCG/ML IV SOLN
INTRAVENOUS | Status: AC
  Administered 2016-09-17: 18:00:00 via INTRAVENOUS
  Filled 2016-09-17 (×2): qty 2000

## 2016-09-17 MED ORDER — INSULIN ASPART 100 UNIT/ML ~~LOC~~ SOLN
0.0000 [IU] | SUBCUTANEOUS | Status: DC
  Administered 2016-09-17: 4 [IU] via SUBCUTANEOUS
  Administered 2016-09-17: 20 [IU] via SUBCUTANEOUS
  Administered 2016-09-18: 4 [IU] via SUBCUTANEOUS
  Administered 2016-09-18: 3 [IU] via SUBCUTANEOUS
  Administered 2016-09-18: 7 [IU] via SUBCUTANEOUS
  Administered 2016-09-18 (×2): 4 [IU] via SUBCUTANEOUS
  Administered 2016-09-18 – 2016-09-19 (×2): 3 [IU] via SUBCUTANEOUS
  Administered 2016-09-19: 4 [IU] via SUBCUTANEOUS
  Administered 2016-09-19 – 2016-09-20 (×6): 3 [IU] via SUBCUTANEOUS
  Administered 2016-09-20: 7 [IU] via SUBCUTANEOUS
  Administered 2016-09-20: 11 [IU] via SUBCUTANEOUS
  Administered 2016-09-21: 3 [IU] via SUBCUTANEOUS
  Administered 2016-09-21 (×2): 11 [IU] via SUBCUTANEOUS
  Administered 2016-09-21 – 2016-09-22 (×3): 7 [IU] via SUBCUTANEOUS
  Administered 2016-09-22: 4 [IU] via SUBCUTANEOUS
  Administered 2016-09-22 – 2016-09-23 (×4): 3 [IU] via SUBCUTANEOUS
  Administered 2016-09-23: 7 [IU] via SUBCUTANEOUS
  Administered 2016-09-23 – 2016-09-24 (×3): 3 [IU] via SUBCUTANEOUS
  Administered 2016-09-24 (×3): 4 [IU] via SUBCUTANEOUS
  Administered 2016-09-24: 3 [IU] via SUBCUTANEOUS
  Administered 2016-09-25 (×2): 4 [IU] via SUBCUTANEOUS
  Administered 2016-09-25 (×3): 3 [IU] via SUBCUTANEOUS
  Administered 2016-09-26: 100 [IU] via SUBCUTANEOUS
  Administered 2016-09-26 (×2): 3 [IU] via SUBCUTANEOUS
  Administered 2016-09-27 (×2): 4 [IU] via SUBCUTANEOUS
  Administered 2016-09-27: 7 [IU] via SUBCUTANEOUS
  Administered 2016-09-27: 3 [IU] via SUBCUTANEOUS
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
## 2016-09-17 NOTE — Progress Notes (Signed)
Radiology was made aware about patient's transfer to [phone], spoke to [place].
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
## 2016-09-17 NOTE — Consult Note (Signed)
WOC contacted by rehab admitting nurse about ostomy care.  Patient has had ostomy for some time and is independent with her care.  She however is requesting ostomy paste, we do not stock this product inpatient and instead use barrier rings for abdominal contour issues.  Patient is adamant on use of paste and needs this to change her pouch which has begun to leak once she arrived on the rehab floor.  I have provide the bedside nurse with access to samples of ostomy paste from [facility] samples.   Bedside nurse to call [facility] back if patient has any other needs.  Will follow up with patient for any additional ostomy needs.   Thanks  Giordana Weinheimer [employer], RN,CWOCN, CNS [phone])
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
## 2016-09-17 NOTE — Progress Notes (Signed)
Patient discharged to rehab [phone] as ordered, reported to nurse Santiago Glad.
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
## 2016-09-18 ENCOUNTER — Inpatient Hospital Stay (HOSPITAL_COMMUNITY): Payer: BC Managed Care – PPO | Admitting: Occupational Therapy

## 2016-09-18 ENCOUNTER — Inpatient Hospital Stay (HOSPITAL_COMMUNITY): Payer: BC Managed Care – PPO | Admitting: Physical Therapy

## 2016-09-18 DIAGNOSIS — Z789 Other specified health status: Secondary | ICD-10-CM

## 2016-09-18 DIAGNOSIS — E1142 Type 2 diabetes mellitus with diabetic polyneuropathy: Secondary | ICD-10-CM

## 2016-09-18 DIAGNOSIS — I10 Essential (primary) hypertension: Secondary | ICD-10-CM

## 2016-09-18 DIAGNOSIS — S7292XS Unspecified fracture of left femur, sequela: Secondary | ICD-10-CM

## 2016-09-18 LAB — TYPE AND SCREEN
Blood Product Expiration Date: 201802252359
ISSUE DATE / TIME: 201802091320
Unit Type and Rh: 6200

## 2016-09-18 LAB — GLUCOSE, CAPILLARY
GLUCOSE-CAPILLARY: 134 mg/dL — AB (ref 65–99)
GLUCOSE-CAPILLARY: 121 mg/dL — AB (ref 65–99)
Glucose-Capillary: 145 mg/dL — ABNORMAL HIGH (ref 65–99)
Glucose-Capillary: 151 mg/dL — ABNORMAL HIGH (ref 65–99)
Glucose-Capillary: 172 mg/dL — ABNORMAL HIGH (ref 65–99)
Glucose-Capillary: 193 mg/dL — ABNORMAL HIGH (ref 65–99)
Glucose-Capillary: 204 mg/dL — ABNORMAL HIGH (ref 65–99)

## 2016-09-18 MED ORDER — INSULIN REGULAR HUMAN 100 UNIT/ML IJ SOLN
INTRAVENOUS | Status: AC
  Administered 2016-09-18: 18:00:00 via INTRAVENOUS
  Filled 2016-09-18 (×2): qty 2000
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
## 2016-09-18 NOTE — Consult Note (Signed)
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  She is independent in pouch change and emptying and declines for me to see her pouching sytem or stoma today,.  She had a leak lat pm and requested ostomy paste; one of my partners directed the staff nurse how to obtain paste from our office by phone since we do not stock paste any longer as we use ostomy skin barrier rings instead.  Today patient is taught about skin barrier rings.  She is unfamiliar with these accessory products, I demonstrate the use with one and leave her two other in her box of skin barriers to try should she elect to do so. It is noted that she uses a 1 and 3/4 inch pouching system and these are not available in the hospital. I have provided Nursing with Orders for 2 and 1/4 inch supplies should the patient's supply run out and no others are available.  [place] nursing team will not follow routinely, but will remain available to this patient, the nursing and medical teams.  Please re-consult if needed. Thanks, Maudie Flakes, MSN, RN, [place], Arther Abbott  Pager# [phone]
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
## 2016-09-19 ENCOUNTER — Inpatient Hospital Stay (HOSPITAL_COMMUNITY): Payer: BC Managed Care – PPO

## 2016-09-19 ENCOUNTER — Inpatient Hospital Stay (HOSPITAL_COMMUNITY): Payer: BC Managed Care – PPO | Admitting: Occupational Therapy

## 2016-09-19 DIAGNOSIS — M7989 Other specified soft tissue disorders: Secondary | ICD-10-CM

## 2016-09-19 DIAGNOSIS — R509 Fever, unspecified: Secondary | ICD-10-CM

## 2016-09-19 DIAGNOSIS — Z433 Encounter for attention to colostomy: Secondary | ICD-10-CM

## 2016-09-19 DIAGNOSIS — D62 Acute posthemorrhagic anemia: Secondary | ICD-10-CM

## 2016-09-19 LAB — GLUCOSE, CAPILLARY
GLUCOSE-CAPILLARY: 100 mg/dL — AB (ref 65–99)
GLUCOSE-CAPILLARY: 129 mg/dL — AB (ref 65–99)
GLUCOSE-CAPILLARY: 139 mg/dL — AB (ref 65–99)
Glucose-Capillary: 143 mg/dL — ABNORMAL HIGH (ref 65–99)
Glucose-Capillary: 139 mg/dL — ABNORMAL HIGH (ref 65–99)
Glucose-Capillary: 153 mg/dL — ABNORMAL HIGH (ref 65–99)

## 2016-09-19 MED ORDER — TRACE MINERALS CR-CU-MN-SE-ZN 10-1000-500-60 MCG/ML IV SOLN
INTRAVENOUS | Status: AC
  Administered 2016-09-19: 18:00:00 via INTRAVENOUS
  Filled 2016-09-19 (×2): qty 2000
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
## 2016-09-19 NOTE — Progress Notes (Signed)
Occupational Therapy Session Note  Patient Details  Name: Barbara Matthews MRN: [id number] Date of Birth: [birth year]  Today's Date: 09/19/2016 OT Individual Time: 1504-1364 OT Individual Time Calculation (min): 45 min    Short Term Goals: Week 1:  OT Short Term Goal 1 (Week 1): Pt will complete bathing with Max A sit<stand OT Short Term Goal 2 (Week 1): Pt will thread both legs into pants with use of AE PRN OT Short Term Goal 3 (Week 1): Pt will complete toilet transfer with LRAD and Max A OT Short Term Goal 4 (Week 1): Pt will engage in functional task for 10 minutes without rest to increase activity tolerance   Skilled Therapeutic Interventions/Progress Updates: Pt was lying in bed at time of arrival, reported needing to void. Tx focus on functional transfers and adherence to NWB precautions. Supine<sit completed with extra time and Min A with HOB flat. Pt assisted OT with placement of slideboard and completed transfer to drop arm commode with overall Min A for keeping L LE elevated. Extra time provided due to pain/fatigue. During transfer, pt reported that she could no longer hold bladder and began urinating on floor. Pt was able to complete transfer and finish urinating while on drop arm commode. L LE elevated on trash can for support. OT sanitized floor/trash can while pt was voiding. Total A for hygiene. RN also notified due to leaking colostomy bag (had soiled bedsheets). Bed linen was then changed and pt completed transfer back to bed in manner as written above. She was able to laterally scoot upwards in bed with min guard. Sit<supine completed with Min A for L LE. She was then repositioned for comfort and left with nurse tech a time of departure to address colostomy bag leakage.       Therapy Documentation Precautions:  Precautions Precautions: Fall, Knee Precaution Comments: no pillows under Lt knee to prevent knee contracture  Required Braces or Orthoses: Other Brace/Splint Other  Brace/Splint:  (Hinged knee brace) Restrictions Weight Bearing Restrictions: Yes LLE Weight Bearing: Non weight bearing General:   Vital Signs: Therapy Vitals Pulse Rate: (!) 112 BP: (!) 145/68 Pain: Pt medicated prior to session, c/o pain but agreeable to session    ADL: ADL ADL Comments: Please see functional navigator for ADL status    See Function Navigator for Current Functional Status.   Therapy/Group: Individual Therapy  Mehr Depaoli A Martel Galvan 09/19/2016, 12:38 PM

## 2016-09-19 NOTE — Progress Notes (Signed)
VASCULAR LAB PRELIMINARY  PRELIMINARY  PRELIMINARY  PRELIMINARY  Bilateral lower extremity venous duplex completed.    Preliminary report:  There is no obvious evidence of DVT or SVT noted in the bilateral lower extremities.   Marlan Steward, RVT 09/19/2016, 11:17 AM
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
## 2016-09-20 ENCOUNTER — Inpatient Hospital Stay (HOSPITAL_COMMUNITY): Payer: BC Managed Care – PPO | Admitting: Occupational Therapy

## 2016-09-20 ENCOUNTER — Inpatient Hospital Stay (HOSPITAL_COMMUNITY): Payer: BC Managed Care – PPO | Admitting: Physical Therapy

## 2016-09-20 DIAGNOSIS — E46 Unspecified protein-calorie malnutrition: Secondary | ICD-10-CM

## 2016-09-20 DIAGNOSIS — D638 Anemia in other chronic diseases classified elsewhere: Secondary | ICD-10-CM

## 2016-09-20 LAB — DIFFERENTIAL
BASOS ABS: 0 10*3/uL (ref 0.0–0.1)
BASOS PCT: 0 %
Eosinophils Absolute: 0.3 10*3/uL (ref 0.0–0.7)
Eosinophils Relative: 4 %
LYMPHS PCT: 15 %
Lymphs Abs: 1 10*3/uL (ref 0.7–4.0)
MONOS PCT: 13 %
Monocytes Absolute: 0.8 10*3/uL (ref 0.1–1.0)
NEUTROS ABS: 4.6 10*3/uL (ref 1.7–7.7)
Neutrophils Relative %: 68 %

## 2016-09-20 LAB — CBC
HCT: 27.7 % — ABNORMAL LOW (ref 36.0–46.0)
Hemoglobin: 8.6 g/dL — ABNORMAL LOW (ref 12.0–15.0)
MCH: 26.3 pg (ref 26.0–34.0)
MCHC: 31 g/dL (ref 30.0–36.0)
MCV: 84.7 fL (ref 78.0–100.0)
Platelets: 215 10*3/uL (ref 150–400)
RBC: 3.27 MIL/uL — AB (ref 3.87–5.11)
RDW: 19.3 % — AB (ref 11.5–15.5)
WBC: 6.6 10*3/uL (ref 4.0–10.5)

## 2016-09-20 LAB — COMPREHENSIVE METABOLIC PANEL
ALK PHOS: 125 U/L (ref 38–126)
ALT: 33 U/L (ref 14–54)
ANION GAP: 6 (ref 5–15)
AST: 23 U/L (ref 15–41)
Albumin: 2 g/dL — ABNORMAL LOW (ref 3.5–5.0)
BUN: 24 mg/dL — ABNORMAL HIGH (ref 6–20)
CALCIUM: 9 mg/dL (ref 8.9–10.3)
CHLORIDE: 99 mmol/L — AB (ref 101–111)
CO2: 30 mmol/L (ref 22–32)
Creatinine, Ser: 0.68 mg/dL (ref 0.44–1.00)
GFR calc non Af Amer: >60 mL/min (ref >60)
Glucose, Bld: 154 mg/dL — ABNORMAL HIGH (ref 65–99)
POTASSIUM: 4.5 mmol/L (ref 3.5–5.1)
SODIUM: 135 mmol/L (ref 135–145)
Total Bilirubin: 3.7 mg/dL — ABNORMAL HIGH (ref 0.3–1.2)
Total Protein: 5.7 g/dL — ABNORMAL LOW (ref 6.5–8.1)

## 2016-09-20 LAB — MAGNESIUM: Magnesium: 1.8 mg/dL (ref 1.7–2.4)

## 2016-09-20 LAB — GLUCOSE, CAPILLARY
GLUCOSE-CAPILLARY: 121 mg/dL — AB (ref 65–99)
GLUCOSE-CAPILLARY: 150 mg/dL — AB (ref 65–99)
GLUCOSE-CAPILLARY: 207 mg/dL — AB (ref 65–99)
GLUCOSE-CAPILLARY: 263 mg/dL — AB (ref 65–99)
Glucose-Capillary: 102 mg/dL — ABNORMAL HIGH (ref 65–99)
Glucose-Capillary: 267 mg/dL — ABNORMAL HIGH (ref 65–99)

## 2016-09-20 LAB — PREALBUMIN: Prealbumin: 15.1 mg/dL — ABNORMAL LOW (ref 18–38)

## 2016-09-20 LAB — TRIGLYCERIDES: TRIGLYCERIDES: 117 mg/dL (ref <150)

## 2016-09-20 LAB — PHOSPHORUS: PHOSPHORUS: 3.6 mg/dL (ref 2.5–4.6)

## 2016-09-20 MED ORDER — CLINIMIX E/DEXTROSE (5/15) 5 % IV SOLN
INTRAVENOUS | Status: AC
  Administered 2016-09-20: 18:00:00 via INTRAVENOUS
  Filled 2016-09-20 (×2): qty 1990

## 2016-09-20 MED ORDER — SIMETHICONE 80 MG PO CHEW
80.0000 mg | CHEWABLE_TABLET | Freq: Four times a day (QID) | ORAL | Status: DC | PRN
  Filled 2016-09-20: qty 1

## 2016-09-20 MED ORDER — FAT EMULSION 20 % IV EMUL
240.0000 mL | INTRAVENOUS | Status: AC
  Administered 2016-09-20: 240 mL via INTRAVENOUS
  Filled 2016-09-20: qty 250
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
## 2016-09-20 NOTE — Progress Notes (Signed)
Occupational Therapy Session Note  Patient Details  Name: Barbara Matthews MRN: [id number] Date of Birth: 1956-03-30  Today's Date: 09/20/2016 OT Individual Time: 2992-4268 and 1330-1430 OT Individual Time Calculation (min): 58 min and 60 min    Short Term Goals: Week 1:  OT Short Term Goal 1 (Week 1): Pt will complete bathing with Max A sit<stand OT Short Term Goal 2 (Week 1): Pt will thread both legs into pants with use of AE PRN OT Short Term Goal 3 (Week 1): Pt will complete toilet transfer with LRAD and Max A OT Short Term Goal 4 (Week 1): Pt will engage in functional task for 10 minutes without rest to increase activity tolerance   Skilled Therapeutic Interventions/Progress Updates:     Session 1: Upon entering the room, pt supine and needing mod A for supine >sit. Pt performed bathing while seated on EOB with min A. Pt needing mod A for lateral leans to L <> R to wash buttocks. Pt needing L LE propped secondary to pain with it being unsupported. Pt donned hospital gown with set up A. OT set up wheelchair and placed slide board with total A. L LE remained propped on trash can and pt required max A for slide board transfer to the left from bed >wheelchair. Pt reports being very fatigued with movement and needing max cues for techniques and safety. Pt seated in wheelchair with L LE elevated on leg rest. Call bell and all needed items within reach.   Session 2: Upon entering the room, pt supine in bed with 6/10 c/o pain in L LE but agreeable to OT intervention. OT provided pt with leg lifter and demonstrated use for bed mobility. Pt demonstrated ability to move L LE off EOB with use of leg lifter. Pt able to tolerate LE being unsupported while sitting EOB for 6 minutes this session. OT placed L LE onto therapist foot and provided max A for stand with use of bed rail and RW. Pt able to stand for 75 seconds before sitting back down. Pt requiring max cues for anterior weight shift and hand  placement for proper technique. Pt utilized leg lifter with assistance to place L LE back onto bed for sit >supine. Pt very fatigued at this time. Call bell and all needed items within reach upon exiting the room.   Therapy Documentation Precautions:  Precautions Precautions: Fall, Knee Precaution Comments: no pillows under Lt knee to prevent knee contracture  Required Braces or Orthoses: Other Brace/Splint Other Brace/Splint:  (Hinged knee brace) Restrictions Weight Bearing Restrictions: Yes LLE Weight Bearing: Non weight bearing General:   Vital Signs: Therapy Vitals Pulse Rate: (!) 106 BP: (!) 127/59 Pain: Pain Assessment Pain Assessment: No/denies pain Pain Score: 0-No pain Faces Pain Scale: No hurt Pain Intervention(s): Medication (See eMAR) (scheduled) ADL: ADL ADL Comments: Please see functional navigator for ADL status Exercises:   Other Treatments:    See Function Navigator for Current Functional Status.   Therapy/Group: Individual Therapy  Gypsy Decant 09/20/2016, 9:15 PM
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
## 2016-09-20 NOTE — Progress Notes (Signed)
Physical Therapy Session Note  Patient Details  Name: Barbara Matthews MRN: [id number] Date of Birth: 11-09-1955  Today's Date: 09/20/2016 PT Individual Time: 3406-8403 PT Individual Time Calculation (min): 77 min   Short Term Goals: Week 1:  PT Short Term Goal 1 (Week 1): Pt will perform supine to sit and sit to supine with max A x 1 PT Short Term Goal 2 (Week 1): Pt will perform bed to chair transfer with RW, maintaining L LE NWB with max A x 1 PT Short Term Goal 3 (Week 1): Pt will ambulate 10 ft with RW with max A x 1, maintaining NWB on L LE. PT Short Term Goal 4 (Week 1): Pt will perform sit to stand with RW with max A x 1, maintaining NWB on L LE PT Short Term Goal 5 (Week 1): Pt will propel manual wheelchair for building UE strength for ambulation x 150 ft with supervision.  Skilled Therapeutic Interventions/Progress Updates: Pt presented in w/c agreeable to therapy. Instructed pt in use of w/c breaks and propulsion.  Performed w/c propulsion training with pt propelling approx 63f with min/modA. Pt with fair carryover for technique requiring occasional hand over hand instruction for turns and negotiation. Performed SB transfer with modA to level mat with cues for head/hips relationship and sequencing. Pt required PTA support of LLE due to pain management. Instructed pt in lowering leg throughout day for increased tolerance.  Pt propelled additional 67fwith improved technique. Pt remained in w/c at end of session with all current needs met.      Therapy Documentation Precautions:  Precautions Precautions: Fall, Knee Precaution Comments: no pillows under Lt knee to prevent knee contracture  Required Braces or Orthoses: Other Brace/Splint Other Brace/Splint:  (Hinged knee brace) Restrictions Weight Bearing Restrictions: Yes LLE Weight Bearing: Non weight bearing    See Function Navigator for Current Functional Status.   Therapy/Group: Individual Therapy  Barbara Matthews 09/20/2016, 12:26 PM

## 2016-09-20 NOTE — Care Management Note (Signed)
Inpatient Rehabilitation Center Individual Statement of Services  Patient Name:  Barbara Matthews  Date:  09/20/2016  Welcome to the [facility].  Our goal is to provide you with an individualized program based on your diagnosis and situation, designed to meet your specific needs.  With this comprehensive rehabilitation program, you will be expected to participate in at least 3 hours of rehabilitation therapies Monday-Friday, with modified therapy programming on the weekends.  Your rehabilitation program will include the following services:  Physical Therapy (PT), Occupational Therapy (OT), 24 hour per day rehabilitation nursing, Therapeutic Recreaction (TR), Neuropsychology, Case Management (Social Worker), Rehabilitation Medicine, Nutrition Services and Pharmacy Services  Weekly team conferences will be held on Wednesdays to discuss your progress.  Your Social Worker will talk with you frequently to get your input and to update you on team discussions.  Team conferences with you and your family in attendance may also be held.  Expected length of stay: 3-4 weeks  Overall anticipated outcome: minimal assistance  Depending on your progress and recovery, your program may change. Your Social Worker will coordinate services and will keep you informed of any changes. Your Social Worker's name and contact numbers are listed  below.  The following services may also be recommended but are not provided by the [facility] will be made to provide these services after discharge if needed.  Arrangements include referral to agencies that provide these services.  Your insurance has been verified to be:  [employer] Your primary doctor is:  Bebe Liter, [place]  Pertinent information will be shared with your doctor and your insurance company.  Social Worker:   [place], [place] or ([phone]   Information discussed with and copy given to patient by: Lennart Pall, 09/20/2016, 11:51 AM

## 2016-09-20 NOTE — Progress Notes (Signed)
Patient information reviewed and entered into eRehab system by Marlyss Cissell, RN, CRRN, PPS Coordinator.  Information including medical coding and functional independence measure will be reviewed and updated through discharge.    

## 2016-09-21 ENCOUNTER — Inpatient Hospital Stay (HOSPITAL_COMMUNITY): Payer: BC Managed Care – PPO | Admitting: Physical Therapy

## 2016-09-21 ENCOUNTER — Inpatient Hospital Stay (HOSPITAL_COMMUNITY): Payer: BC Managed Care – PPO

## 2016-09-21 ENCOUNTER — Inpatient Hospital Stay (HOSPITAL_COMMUNITY): Payer: BC Managed Care – PPO | Admitting: Occupational Therapy

## 2016-09-21 LAB — GLUCOSE, CAPILLARY
GLUCOSE-CAPILLARY: 202 mg/dL — AB (ref 65–99)
GLUCOSE-CAPILLARY: 243 mg/dL — AB (ref 65–99)
Glucose-Capillary: 208 mg/dL — ABNORMAL HIGH (ref 65–99)
Glucose-Capillary: 124 mg/dL — ABNORMAL HIGH (ref 65–99)
Glucose-Capillary: 117 mg/dL — ABNORMAL HIGH (ref 65–99)
Glucose-Capillary: 194 mg/dL — ABNORMAL HIGH (ref 65–99)

## 2016-09-21 MED ORDER — TRACE MINERALS CR-CU-MN-SE-ZN 10-1000-500-60 MCG/ML IV SOLN
INTRAVENOUS | Status: AC
  Administered 2016-09-21: 18:00:00 via INTRAVENOUS
  Filled 2016-09-21 (×2): qty 1990

## 2016-09-21 MED ORDER — WHITE PETROLATUM GEL
Status: AC
  Filled 2016-09-21: qty 1

## 2016-09-21 MED ORDER — WHITE PETROLATUM GEL
Status: DC | PRN
  Administered 2016-09-21: 12:00:00 via TOPICAL

## 2016-09-21 NOTE — Progress Notes (Signed)
Occupational Therapy Session Note  Patient Details  Name: Barbara Matthews MRN: [id number] Date of Birth: [birth year]  Today's Date: 09/21/2016 OT Individual Time: 1300-1400 OT Individual Time Calculation (min): 60 min    Short Term Goals: Week 1:  OT Short Term Goal 1 (Week 1): Pt will complete bathing with Max A sit<stand OT Short Term Goal 2 (Week 1): Pt will thread both legs into pants with use of AE PRN OT Short Term Goal 3 (Week 1): Pt will complete toilet transfer with LRAD and Max A OT Short Term Goal 4 (Week 1): Pt will engage in functional task for 10 minutes without rest to increase activity tolerance   Skilled Therapeutic Interventions/Progress Updates:   Pt supine in bed upon arrival reporting 5/10 pain level with nurse in room to administer medication. Pt agreeable to tx. With VC for reacher technique and increased time, Pt threads BLE into XL paper scrub pants. With MAX A to lift and lower and VC to for WB precautions, pt stands and therapist advances pants past hips. Pt sits EOB with LLE supported on trash can for rest breaks. To increase tolerace to sitting with LLE unsupported, Pt assembles, addresses, and closes valentines with supervision. Pt sits EOB 2x55mn with leg unsupported. Pt SBT EOB<>WC with MAX A and VC for LE management throughout transfer. Pt returned supine in bed with call light in reach and all needs met.   Therapy Documentation Precautions:  Precautions Precautions: Fall, Knee Precaution Comments: no pillows under Lt knee to prevent knee contracture  Required Braces or Orthoses: Other Brace/Splint Other Brace/Splint:  (Hinged knee brace) Restrictions Weight Bearing Restrictions: Yes LLE Weight Bearing: Non weight bearing General:   Vital Signs: Therapy Vitals Temp: 99.1 F (37.3 C) Temp Source: Oral Pulse Rate: 93 Resp: 18 BP: 123/61 Patient Position (if appropriate): Lying Oxygen Therapy SpO2: 98 % O2 Device: Not Delivered Pain: Pain  Assessment Pain Assessment: 0-10 Pain Score: 5  Pain Type: Acute pain Pain Location: Leg Pain Orientation: Left Pain Descriptors / Indicators: Aching Pain Frequency: Intermittent Pain Onset: Gradual Patients Stated Pain Goal: 2 Pain Intervention(s): Medication (See eMAR) ADL: ADL ADL Comments: Please see functional navigator for ADL status Exercises:   Other Treatments:    See Function Navigator for Current Functional Status.   Therapy/Group: Individual Therapy  STonny Branch2/13/2018, 3:32 PM

## 2016-09-21 NOTE — Progress Notes (Signed)
Physical Therapy Session Note  Patient Details  Name: Barbara Matthews MRN: [id number] Date of Birth: 05/17/1956  Today's Date: 09/21/2016 PT Individual Time: 0800-0900 PT Individual Time Calculation (min): 60 min   Short Term Goals: Week 1:  PT Short Term Goal 1 (Week 1): Pt will perform supine to sit and sit to supine with max A x 1 PT Short Term Goal 2 (Week 1): Pt will perform bed to chair transfer with RW, maintaining L LE NWB with max A x 1 PT Short Term Goal 3 (Week 1): Pt will ambulate 10 ft with RW with max A x 1, maintaining NWB on L LE. PT Short Term Goal 4 (Week 1): Pt will perform sit to stand with RW with max A x 1, maintaining NWB on L LE PT Short Term Goal 5 (Week 1): Pt will propel manual wheelchair for building UE strength for ambulation x 150 ft with supervision.  Skilled Therapeutic Interventions/Progress Updates:  Pt presented in bed completing breakfast agreeable to therapy. Performed supine therex while pt awaiting pain meds. Performed ankle pumps, QS, x 10 bilaterally, hip abd/add AA LLE, AROM RLE. Pt instructed in performing HEP x 3/day. Instructed pt in use of leg lifter to assist with lowering leg off bed. Pt performed supine to sit with minA for LLE placement and sequencing. Sat at EOB x 5 min lowering leg for increased tolerance. Continued edu on maintaining NWB precautions. Performed sit to stand with maxA x1 from elevated bed. Pt able to maintain NWB in static standing. Attempted stand pivot transfer to w/c, pt with difficulty maintaning NWB on LLE. Pt left in w/c at end of session with call bell within reach and all current needs met.      Therapy Documentation Precautions:  Precautions Precautions: Fall, Knee Precaution Comments: no pillows under Lt knee to prevent knee contracture  Required Braces or Orthoses: Other Brace/Splint Other Brace/Splint:  (Hinged knee brace) Restrictions Weight Bearing Restrictions: Yes LLE Weight Bearing: Non weight  bearing General:Pt    Vital Signs:  Pain: Pain Assessment Pain Assessment: 0-10 Pain Score: 4  Pain Type: Acute pain Pain Location: Leg Pain Orientation: Left Pain Descriptors / Indicators: Aching Pain Frequency: Intermittent Pain Onset: Gradual Patients Stated Pain Goal: 1 Pain Intervention(s): Medication (See eMAR)   See Function Navigator for Current Functional Status.   Therapy/Group: Individual Therapy  Carlisle Torgeson  Damontay Alred, PTA  09/21/2016, 12:28 PM
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
## 2016-09-21 NOTE — Consult Note (Addendum)
WOC followup: Please refer to progress notes from [facility] consults on 2/7 and 2/10.  Pt states she now has supplies from home at the bedside and denies need for further assistance; she is independent with pouch application and uses Hollister 1 3/4 inch, 2 piece convex pouching system with high output spout for emptying and paste around stoma. Please re-consult if further assistance is needed.  Thank-you,  Julien Girt MSN, [place], [place], [place], [place]
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
## 2016-09-21 NOTE — Progress Notes (Signed)
Occupational Therapy Session Note  Patient Details  Name: Sequoya Hogsett MRN: [id number] Date of Birth: 1956-02-24  Today's Date: 09/21/2016 OT Individual Time: 3300-7622 OT Individual Time Calculation (min): 76 min    Short Term Goals: Week 1:  OT Short Term Goal 1 (Week 1): Pt will complete bathing with Max A sit<stand OT Short Term Goal 2 (Week 1): Pt will thread both legs into pants with use of AE PRN OT Short Term Goal 3 (Week 1): Pt will complete toilet transfer with LRAD and Max A OT Short Term Goal 4 (Week 1): Pt will engage in functional task for 10 minutes without rest to increase activity tolerance    Skilled Therapeutic Interventions/Progress Updates:    Upon entering the room, pt seated in wheelchair with no c/o pain but with flat affect. Pt reports she did not like breakfast and was still hungry. Pt propelled wheelchair with B UEs to RN station 100' for UE strength and cardiovascular endurance. Pt sitting in wheelchair and eating yogurt while OT discussed goals for this week and began discharge education/recommendations with pt. Pt able to rest L LE directly onto floor for 11 minutes without reporting increase in pain. She needed assistance to return to L LE onto leg rest. Pt reporting need for toileting. Stand pivot transfer from wheelchair > toilet with use of grab bar and max A to maintain NWB. Pt performed hygiene but needing assistance with clothing management. Pt washing hands at sink with set up A and transferring into bed in same manner. Pt requires max A for initiation and sequencing for movement/transfers. Pt sitting on EOB with basin for bathing while sitting on EOB. NT notified as OT exited the room. Call bell and all needed items within reach.   Therapy Documentation Precautions:  Precautions Precautions: Fall, Knee Precaution Comments: no pillows under Lt knee to prevent knee contracture  Required Braces or Orthoses: Other Brace/Splint Other Brace/Splint:   (Hinged knee brace) Restrictions Weight Bearing Restrictions: Yes LLE Weight Bearing: Non weight bearing General:   Vital Signs: Therapy Vitals Temp: 99.1 F (37.3 C) Temp Source: Oral Pulse Rate: 93 Resp: 18 BP: 123/61 Patient Position (if appropriate): Lying Oxygen Therapy SpO2: 98 % O2 Device: Not Delivered Pain: Pain Assessment Pain Assessment: 0-10 Pain Score: 3  Pain Type: Acute pain Pain Location: Leg Pain Orientation: Left Pain Descriptors / Indicators: Aching Pain Frequency: Intermittent Pain Onset: Gradual Patients Stated Pain Goal: 2 Pain Intervention(s): Medication (See eMAR) ADL: ADL ADL Comments: Please see functional navigator for ADL status  See Function Navigator for Current Functional Status.   Therapy/Group: Individual Therapy  Gypsy Decant 09/21/2016, 4:21 PM

## 2016-09-22 ENCOUNTER — Inpatient Hospital Stay (HOSPITAL_COMMUNITY): Payer: BC Managed Care – PPO | Admitting: Physical Therapy

## 2016-09-22 ENCOUNTER — Ambulatory Visit (HOSPITAL_COMMUNITY): Payer: BC Managed Care – PPO | Admitting: Psychology

## 2016-09-22 ENCOUNTER — Inpatient Hospital Stay (HOSPITAL_COMMUNITY): Payer: BC Managed Care – PPO | Admitting: Occupational Therapy

## 2016-09-22 DIAGNOSIS — C210 Malignant neoplasm of anus, unspecified: Secondary | ICD-10-CM

## 2016-09-22 DIAGNOSIS — S72402S Unspecified fracture of lower end of left femur, sequela: Secondary | ICD-10-CM

## 2016-09-22 DIAGNOSIS — F4323 Adjustment disorder with mixed anxiety and depressed mood: Secondary | ICD-10-CM

## 2016-09-22 DIAGNOSIS — S72402A Unspecified fracture of lower end of left femur, initial encounter for closed fracture: Secondary | ICD-10-CM

## 2016-09-22 LAB — GLUCOSE, CAPILLARY
GLUCOSE-CAPILLARY: 148 mg/dL — AB (ref 65–99)
Glucose-Capillary: 129 mg/dL — ABNORMAL HIGH (ref 65–99)
Glucose-Capillary: 109 mg/dL — ABNORMAL HIGH (ref 65–99)
Glucose-Capillary: 189 mg/dL — ABNORMAL HIGH (ref 65–99)
Glucose-Capillary: 205 mg/dL — ABNORMAL HIGH (ref 65–99)
Glucose-Capillary: 136 mg/dL — ABNORMAL HIGH (ref 65–99)

## 2016-09-22 MED ORDER — FAT EMULSION 20 % IV EMUL
240.0000 mL | INTRAVENOUS | Status: AC
  Administered 2016-09-22: 240 mL via INTRAVENOUS
  Filled 2016-09-22: qty 250

## 2016-09-22 MED ORDER — METOPROLOL TARTRATE 25 MG PO TABS
37.5000 mg | ORAL_TABLET | Freq: Two times a day (BID) | ORAL | Status: DC
  Administered 2016-09-22 – 2016-10-02 (×20): 37.5 mg via ORAL
  Filled 2016-09-22 (×20): qty 1

## 2016-09-22 MED ORDER — INSULIN REGULAR HUMAN 100 UNIT/ML IJ SOLN
INTRAVENOUS | Status: AC
  Administered 2016-09-22 (×2): via INTRAVENOUS
  Filled 2016-09-22 (×2): qty 1990
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
## 2016-09-22 NOTE — Progress Notes (Signed)
Physical Therapy Session Note  Patient Details  Name: Barbara Matthews MRN: [id number] Date of Birth: December 02, 1955  Today's Date: 09/22/2016 PT Individual Time: 6789-3810 PT Individual Time Calculation (min): 45 min   Short Term Goals: Week 1:  PT Short Term Goal 1 (Week 1): Pt will perform supine to sit and sit to supine with max A x 1 PT Short Term Goal 2 (Week 1): Pt will perform bed to chair transfer with RW, maintaining L LE NWB with max A x 1 PT Short Term Goal 3 (Week 1): Pt will ambulate 10 ft with RW with max A x 1, maintaining NWB on L LE. PT Short Term Goal 4 (Week 1): Pt will perform sit to stand with RW with max A x 1, maintaining NWB on L LE PT Short Term Goal 5 (Week 1): Pt will propel manual wheelchair for building UE strength for ambulation x 150 ft with supervision.  Skilled Therapeutic Interventions/Progress Updates:    no c/o pain, session focus on UE strengthening, w/c mobility, and transfers.    Pt transfers supine>sit with leg lifter, HOB elevated, and bed rails with supervision and increased time.  Stand/pivot bed>w/c with max assist and therapist facilitating NWB on LLE.  Pt propelled w/c to therapy gym with BUEs for mobility, UE strengthening, and cardiovascular endurance.  Sit<>stand from w/c with mod assist to attempt stand/pivot but pt unable to hop back to mat, so returned to w/c.  Stand/pivot w/c>mat on pt's left with max assist and therapist providing assist to maintain NWB.  Without therapist assistance, pt only able to maintain NWB 25% of the time during transfers.  UE strengthening via tricep push ups with push up blocks 2x5 reps.  PT demonstrated w/c pushups and encouraged pt to continue with these throughout the day to strengthen UEs for carryover into hopping with RW.  Pt verbalized understanding.  Pt returned to room at end of session, positioned upright in w/c with call bell in reach and needs met.   Therapy Documentation Precautions:   Precautions Precautions: Fall, Knee Precaution Comments: no pillows under Lt knee to prevent knee contracture  Required Braces or Orthoses: Other Brace/Splint Other Brace/Splint:  (Hinged knee brace) Restrictions Weight Bearing Restrictions: Yes LLE Weight Bearing: Non weight bearing   See Function Navigator for Current Functional Status.   Therapy/Group: Individual Therapy  Earnest Conroy Penven-Crew 09/22/2016, 2:12 PM
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
## 2016-09-22 NOTE — Progress Notes (Signed)
Occupational Therapy Session Note  Patient Details  Name: Barbara Matthews MRN: [id number] Date of Birth: 1955-10-31  Today's Date: 09/22/2016 OT Individual Time: 7829-5621 OT Individual Time Calculation (min): 55 min    Short Term Goals: Week 1:  OT Short Term Goal 1 (Week 1): Pt will complete bathing with Max A sit<stand OT Short Term Goal 2 (Week 1): Pt will thread both legs into pants with use of AE PRN OT Short Term Goal 3 (Week 1): Pt will complete toilet transfer with LRAD and Max A OT Short Term Goal 4 (Week 1): Pt will engage in functional task for 10 minutes without rest to increase activity tolerance   Skilled Therapeutic Interventions/Progress Updates:    Upon entering the room, pt seated in wheelchair awaiting therapist arrival. Pt agreeable to OT intervention this session. Pt with no c/o pain this session. Pt requesting to wash and change hospital gown at sink for bathing. Pt performing UB bathing seated in wheelchair with set up A and pt tolerating L LE on floor for 17 minutes this session with no report of increased pain. Pt performed stand pivot transfer to R with max A from therapist from wheelchair to bed. Min A for sit >supine with use of leg lifter. Pt rolling L <> R to remove pants and perform hygiene. Pt remained in bed at end of session with call bell and all needed items within reach.   Therapy Documentation Precautions:  Precautions Precautions: Fall, Knee Precaution Comments: no pillows under Lt knee to prevent knee contracture  Required Braces or Orthoses: Other Brace/Splint Other Brace/Splint:  (Hinged knee brace) Restrictions Weight Bearing Restrictions: Yes LLE Weight Bearing: Non weight bearing General:   Vital Signs:  Pain: Pain Assessment Pain Assessment: 0-10 Pain Score: 0-No pain ADL: ADL ADL Comments: Please see functional navigator for ADL status Exercises:   Other Treatments:    See Function Navigator for Current Functional  Status.   Therapy/Group: Individual Therapy  Gypsy Decant 09/22/2016, 12:50 PM

## 2016-09-22 NOTE — Progress Notes (Signed)
Physical Therapy Session Note  Patient Details  Name: Barbara Matthews MRN: [id number] Date of Birth: [birth year]  Today's Date: 09/22/2016 PT Individual Time: 0900-1000 7867-6720 PT Individual Time Calculation (min): 60 min and 30 min  Short Term Goals: Week 1:  PT Short Term Goal 1 (Week 1): Pt will perform supine to sit and sit to supine with max A x 1 PT Short Term Goal 2 (Week 1): Pt will perform bed to chair transfer with RW, maintaining L LE NWB with max A x 1 PT Short Term Goal 3 (Week 1): Pt will ambulate 10 ft with RW with max A x 1, maintaining NWB on L LE. PT Short Term Goal 4 (Week 1): Pt will perform sit to stand with RW with max A x 1, maintaining NWB on L LE PT Short Term Goal 5 (Week 1): Pt will propel manual wheelchair for building UE strength for ambulation x 150 ft with supervision.  Skilled Therapeutic Interventions/Progress Updates: Tx1: Pt presented in bed agreeable to therapy. Pt performed supine to sit with min guard with pt's use of leg lifter. Pt required additional time to lower leg. Performed sit to stand from elevated surface and pt required maxA for pulling up pants. Second trial sit to stand with minA cues to maintain LLE elevated. Performed stand pivot transfer with max cues for NWB on LLE. Total dependent for placing leg extender on w/c. Max cues for releasing breaks. Propelled 185f with w/c minA for straight path, modA for turns. Performed sit to/from stand in parallel bars with modA for technique and mod cues for NWB. Trailed sit to/from stand from w/c with RW with modA and improved technique. Pt returned to room via transport and left in w/c with all current needs met.   Tx 2: Pt presented in w/c. Pt able to release breaks with no cues, Propelled throughout unit with mod cues for straight path. Pt able to propel over small thresholds and carpeted area with min verbal cues. Pt request to return to bed. Trial of squat pivot transfer with maxA however pt able to  maintain NWB restriction during transfer. Pt able to perform sit to supine with minA and cues for sequencing. Pt [facility]. Pt left in bed with call bell within reach and needs met.      Therapy Documentation Precautions:  Precautions Precautions: Fall, Knee Precaution Comments: no pillows under Lt knee to prevent knee contracture  Required Braces or Orthoses: Other Brace/Splint Other Brace/Splint:  (Hinged knee brace) Restrictions Weight Bearing Restrictions: Yes LLE Weight Bearing: Non weight bearing   See Function Navigator for Current Functional Status.   Therapy/Group: Individual Therapy  Dagoberto Nealy  Ryanna Teschner, PTA  09/22/2016, 4:08 PM
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
## 2016-09-23 ENCOUNTER — Inpatient Hospital Stay (HOSPITAL_COMMUNITY): Payer: BC Managed Care – PPO

## 2016-09-23 ENCOUNTER — Inpatient Hospital Stay (HOSPITAL_COMMUNITY): Payer: BC Managed Care – PPO | Admitting: Occupational Therapy

## 2016-09-23 DIAGNOSIS — S72402A Unspecified fracture of lower end of left femur, initial encounter for closed fracture: Secondary | ICD-10-CM

## 2016-09-23 LAB — GLUCOSE, CAPILLARY
GLUCOSE-CAPILLARY: 139 mg/dL — AB (ref 65–99)
GLUCOSE-CAPILLARY: 145 mg/dL — AB (ref 65–99)
Glucose-Capillary: 112 mg/dL — ABNORMAL HIGH (ref 65–99)
Glucose-Capillary: 139 mg/dL — ABNORMAL HIGH (ref 65–99)
Glucose-Capillary: 213 mg/dL — ABNORMAL HIGH (ref 65–99)

## 2016-09-23 LAB — COMPREHENSIVE METABOLIC PANEL
ALT: 33 U/L (ref 14–54)
ANION GAP: 6 (ref 5–15)
AST: 22 U/L (ref 15–41)
Albumin: 2.3 g/dL — ABNORMAL LOW (ref 3.5–5.0)
Alkaline Phosphatase: 169 U/L — ABNORMAL HIGH (ref 38–126)
BUN: 33 mg/dL — ABNORMAL HIGH (ref 6–20)
CHLORIDE: 98 mmol/L — AB (ref 101–111)
CO2: 31 mmol/L (ref 22–32)
CREATININE: 0.78 mg/dL (ref 0.44–1.00)
Calcium: 9.5 mg/dL (ref 8.9–10.3)
Glucose, Bld: 131 mg/dL — ABNORMAL HIGH (ref 65–99)
POTASSIUM: 4.8 mmol/L (ref 3.5–5.1)
SODIUM: 135 mmol/L (ref 135–145)
Total Bilirubin: 2.1 mg/dL — ABNORMAL HIGH (ref 0.3–1.2)
Total Protein: 6.5 g/dL (ref 6.5–8.1)

## 2016-09-23 LAB — PHOSPHORUS: PHOSPHORUS: 4.1 mg/dL (ref 2.5–4.6)

## 2016-09-23 LAB — MAGNESIUM: MAGNESIUM: 2.1 mg/dL (ref 1.7–2.4)

## 2016-09-23 MED ORDER — INSULIN ASPART 100 UNIT/ML ~~LOC~~ SOLN: 3.0000 [IU] | Freq: Three times a day (TID) | SUBCUTANEOUS | Status: DC

## 2016-09-23 MED ORDER — TRACE MINERALS CR-CU-MN-SE-ZN 10-1000-500-60 MCG/ML IV SOLN
INTRAVENOUS | Status: AC
  Administered 2016-09-23: 18:00:00 via INTRAVENOUS
  Filled 2016-09-23 (×2): qty 1990
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
## 2016-09-23 NOTE — Progress Notes (Addendum)
7/10 LLE Physical Therapy Note  Patient Details  Name: Barbara Matthews MRN: [id number] Date of Birth: 09/03/1955 Today's Date: 09/23/2016  tx 1:  1045-1130, 45 min individual tx Pain: 7/10, LLE and ribs; RN aware  W/c propulsion using bil UEs x 100' with min assist for steering.  Pt stated L LE brace was uncomfortable.  Brace was slightly distal to optimum position on LE; +2 to repsition in sitting.  Stand pivot with RW +2 w/c > mat to L/R.  Pt continues to have difficulty avoiding wt bearing LLE, but did improved in pivoting R foot during session.  Strengthening in sitting using press up blocks bil UEs sitting EOM;  elevated hips approx 6 in with use of RLe.  Pt left resting in w/c with all needs within reach.  Pt educated on requesting pain meds if needed before tx; pt used call bell to ask for pain meds. LTGs of ambulation and steps discontinued due to pt's difficulty with NWBing LLE and multifactorial weakness.  tx 2:  1540-1610, 30 min individual tx Pain: 3/10 LLE, premedicated  W/c propulsion using bil UEs with VCs for turns.  Slide board transfer to R/L level surface with mod assist, mainly to ensure NWBing LLE.  Seated push ups as above with press up blocks, RLE, x 8; mod VCs for NWBing LLE.  Pt left resting in w/c with quick release belt applied and all needs within reach.  See function navigator for current status  Barbara Matthews 09/23/2016, 7:56 AM

## 2016-09-23 NOTE — Progress Notes (Signed)
Occupational Therapy Session Note  Patient Details  Name: Barbara Matthews MRN: [id number] Date of Birth: 11-09-1955  Today's Date: 09/23/2016 OT Individual Time: 1430-1530   60 min   Short Term Goals: Week 1:  OT Short Term Goal 1 (Week 1): Pt will complete bathing with Max A sit<stand OT Short Term Goal 2 (Week 1): Pt will thread both legs into pants with use of AE PRN OT Short Term Goal 3 (Week 1): Pt will complete toilet transfer with LRAD and Max A OT Short Term Goal 4 (Week 1): Pt will engage in functional task for 10 minutes without rest to increase activity tolerance   Skilled Therapeutic Interventions/Progress Updates:    Pt seen seated in w/c upon arrival, not reporting any pain and agreeabe to tx. Focus of session on self care retraining, and improving functional endurance. While seated in w/c pt threads BLE into XL scrub pants using reacher with step by step VC and visual demonstration. Pt STS from w/c with RW with MIN A for balance and pt advances pants over hips with A. Pt takes seated rest break and requests to don brief in case of incontinent bladder episode. Pt STS as stated above with A from OT to don brief. Pt propels w/c with significantly increased time ~150 feet to day room with VC for efficient propulsion technique. Pt completes 3 standing trials from w/c at high/low table while playing novel game. Trials lasted ~7-10 min in duration with CGA for balance and VC for WB precautions. Pt able to follow rules of game with mod Vc. Pt returned to room in w/c with cal light in reach and all needs met.   Therapy Documentation Precautions:  Precautions Precautions: Fall, Knee Precaution Comments: no pillows under Lt knee to prevent knee contracture  Required Braces or Orthoses: Other Brace/Splint Other Brace/Splint:  (Hinged knee brace) Restrictions Weight Bearing Restrictions: Yes LLE Weight Bearing: Non weight bearing General:   Vital Signs: Therapy Vitals Temp: 98.7 F  (37.1 C) Temp Source: Oral Pulse Rate: (!) 102 Resp: 17 BP: (!) 139/56 Patient Position (if appropriate): Lying Oxygen Therapy SpO2: 99 % O2 Device: Not Delivered Pain: Pain Assessment Pain Assessment: 0-10 Pain Score: 1  Pain Type: Acute pain Pain Location: Leg Pain Orientation: Left Pain Onset: On-going Patients Stated Pain Goal: 0 Pain Intervention(s): Medication (See eMAR);Emotional support (medicated with scheduled Tramadol) ADL: ADL ADL Comments: Please see functional navigator for ADL status Exercises:   Other Treatments:    See Function Navigator for Current Functional Status.   Therapy/Group: Individual Therapy  Tonny Branch 09/23/2016, 7:46 AM

## 2016-09-23 NOTE — Progress Notes (Signed)
Social Work Patient ID: Barbara Matthews, female   DOB: [birth year], 61 y.o.   MRN: [id number]   Met with pt yesterday and spoke with her daughter, Barbara Matthews, today (via phone) to review team conference. Explaining to both the targeted d/c date of 2/24 and min assist wheelchair level goals.  Stressed to both that tx team does NOT feel that pt will be able to manage a flight of stairs to her 2nd floor apartment.  She is having much difficulty maintaining her WB simply with transfers.  Both report they understand this information and that they need to consider other d/c locations that she might stay until WB restrictions are lifted.  Pt very flat and does not think she has any options.  Daughter, however, to reach out to family and friends and see if she can secure a temporary d/c location. I am to follow up with her again tomorrow to discuss.  Continue to follow.  Jinny Sweetland, LCSW

## 2016-09-23 NOTE — Progress Notes (Signed)
Occupational Therapy Session Note  Patient Details  Name: Barbara Matthews MRN: [id number] Date of Birth: [birth year]  Today's Date: 09/23/2016 OT Individual Time: 5638-9373 OT Individual Time Calculation (min): 59 min    Short Term Goals: Week 1:  OT Short Term Goal 1 (Week 1): Pt will complete bathing with Max A sit<stand OT Short Term Goal 2 (Week 1): Pt will thread both legs into pants with use of AE PRN OT Short Term Goal 3 (Week 1): Pt will complete toilet transfer with LRAD and Max A OT Short Term Goal 4 (Week 1): Pt will engage in functional task for 10 minutes without rest to increase activity tolerance   Skilled Therapeutic Interventions/Progress Updates:    Upon entering the room, pt supine in bed with 4/10 c/o soreness in L LE but agreeable to OT intervention. While supine OT unbuckled L brace and washed visible leg for hygiene. Brace refastened and pt rolled L <> R to wash buttocks and peri area with setup A. Pt performed supine >sit with mod A and pt utilized leg lifter for L LE. Pt having increased difficulty tolerating L LE being lowered to ground this session with reported increased pain. Pt declined slide board and squat pivot utilized with max A for pt needing assistance for lifting and lowering. Pt needing assistance to manage wheelchair leg rest and pt seated at sink with set up for UB self care and grooming tasks. Pt remained seated in wheelchair with call bell and all needed items within reach upon exiting the room.   Therapy Documentation Precautions:  Precautions Precautions: Fall, Knee Precaution Comments: no pillows under Lt knee to prevent knee contracture  Required Braces or Orthoses: Other Brace/Splint Other Brace/Splint:  (Hinged knee brace) Restrictions Weight Bearing Restrictions: Yes LLE Weight Bearing: Non weight bearing General:   Vital Signs:  Pain:   ADL: ADL ADL Comments: Please see functional navigator for ADL status Exercises:   Other  Treatments:    See Function Navigator for Current Functional Status.   Therapy/Group: Individual Therapy  Gypsy Decant 09/23/2016, 12:23 PM
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
## 2016-09-24 ENCOUNTER — Inpatient Hospital Stay (HOSPITAL_COMMUNITY): Payer: BC Managed Care – PPO

## 2016-09-24 ENCOUNTER — Inpatient Hospital Stay (HOSPITAL_COMMUNITY): Payer: BC Managed Care – PPO | Admitting: Physical Therapy

## 2016-09-24 LAB — GLUCOSE, CAPILLARY
GLUCOSE-CAPILLARY: 156 mg/dL — AB (ref 65–99)
Glucose-Capillary: 127 mg/dL — ABNORMAL HIGH (ref 65–99)
Glucose-Capillary: 136 mg/dL — ABNORMAL HIGH (ref 65–99)
Glucose-Capillary: 171 mg/dL — ABNORMAL HIGH (ref 65–99)
Glucose-Capillary: 175 mg/dL — ABNORMAL HIGH (ref 65–99)
Glucose-Capillary: 176 mg/dL — ABNORMAL HIGH (ref 65–99)
Glucose-Capillary: 87 mg/dL (ref 65–99)

## 2016-09-24 LAB — CBC WITH DIFFERENTIAL/PLATELET
BASOS PCT: 1 %
Basophils Absolute: 0 10*3/uL (ref 0.0–0.1)
EOS PCT: 4 %
Eosinophils Absolute: 0.2 10*3/uL (ref 0.0–0.7)
HEMATOCRIT: 29.5 % — AB (ref 36.0–46.0)
Hemoglobin: 8.9 g/dL — ABNORMAL LOW (ref 12.0–15.0)
LYMPHS PCT: 23 %
Lymphs Abs: 1.3 10*3/uL (ref 0.7–4.0)
MCH: 25.7 pg — ABNORMAL LOW (ref 26.0–34.0)
MCHC: 30.2 g/dL (ref 30.0–36.0)
MCV: 85.3 fL (ref 78.0–100.0)
MONO ABS: 0.5 10*3/uL (ref 0.1–1.0)
MONOS PCT: 9 %
Neutro Abs: 3.6 10*3/uL (ref 1.7–7.7)
Neutrophils Relative %: 63 %
PLATELETS: 302 10*3/uL (ref 150–400)
RBC: 3.46 MIL/uL — ABNORMAL LOW (ref 3.87–5.11)
RDW: 19.6 % — AB (ref 11.5–15.5)
WBC: 5.7 10*3/uL (ref 4.0–10.5)

## 2016-09-24 MED ORDER — FAT EMULSION 20 % IV EMUL
240.0000 mL | INTRAVENOUS | Status: DC
  Filled 2016-09-24: qty 250

## 2016-09-24 MED ORDER — INSULIN REGULAR HUMAN 100 UNIT/ML IJ SOLN
INTRAVENOUS | Status: AC
  Administered 2016-09-24: 18:00:00 via INTRAVENOUS
  Filled 2016-09-24: qty 1990

## 2016-09-24 MED ORDER — FAT EMULSION 20 % IV EMUL
240.0000 mL | INTRAVENOUS | Status: AC
  Administered 2016-09-24: 240 mL via INTRAVENOUS
  Filled 2016-09-24: qty 250

## 2016-09-24 MED ORDER — INSULIN REGULAR HUMAN 100 UNIT/ML IJ SOLN
INTRAVENOUS | Status: DC
  Filled 2016-09-24: qty 1990
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
## 2016-09-24 NOTE — Progress Notes (Signed)
Physical Therapy Session Note  Patient Details  Name: Barbara Matthews MRN: [id number] Date of Birth: 12-10-1955  Today's Date: 09/24/2016 PT Individual Time: 0930-1030 and 1600-1630 PT Individual Time Calculation (min): 60 min and 30 min (total 90 min)   Short Term Goals: Week 1:  PT Short Term Goal 1 (Week 1): Pt will perform supine to sit and sit to supine with max A x 1 PT Short Term Goal 2 (Week 1): Pt will perform bed to chair transfer with RW, maintaining L LE NWB with max A x 1 PT Short Term Goal 3 (Week 1): Pt will ambulate 10 ft with RW with max A x 1, maintaining NWB on L LE. PT Short Term Goal 4 (Week 1): Pt will perform sit to stand with RW with max A x 1, maintaining NWB on L LE PT Short Term Goal 5 (Week 1): Pt will propel manual wheelchair for building UE strength for ambulation x 150 ft with supervision.  Skilled Therapeutic Interventions/Progress Updates: Tx 1: Pt received supine in bed, c/o pain 2/10 in LLE and agreeable to treatment. Knee brace under LLE however not closed; therapist managed knee brace totalA d/t pt difficulty reaching to lower leg. Transition to sit on EOB with min guard and use of leg lifter to manage LLE. Lateral scoot transfer bed>w/c with minA, max cues for placement of board and technique. W/c propulsion for UE strengthening and endurance. Sit <>stand in parallel bars x6 trials total. Pre-gait training on foot clearance and scapular depression to improve technique; min guard for managing LLE and for sit >stand. Progressed from 1 "hop" per trial to 7 hops per trial with improving technique and efficiency. Sit >stand from w/c with minA, assist again to manage LLE and reduce weightbearing through limb. Stand pivot transfer minA overall, however very slow speed and max verbal cues for technique and management of RW. Lateral scoot to return to w/c minA. Remained seated in w/c at end of session, all needs in reach.   Tx 2: Pt received seated in w/c, declines  participation in therapy outside of room but agreeable to w/c level exercises performed in room. Provided pt with handout written directions and pictures of seated LE strengthening exercises with focus on RLE d/t LLE immobilized. Pt performed hip flexion marching, long arc quad, hip adduction isometric, and glute sets; all 2x15. Required min cues to stay focused to task for counting reps/sets. Lateral scoot transfer to bed with minA for managing LLE. Remained supine at end of session, alarm intact and all needs in reach.   Therapy Documentation Precautions:  Precautions Precautions: Fall, Knee Precaution Comments: no pillows under Lt knee to prevent knee contracture  Required Braces or Orthoses: Other Brace/Splint Other Brace/Splint:  (Hinged knee brace) Restrictions Weight Bearing Restrictions: Yes LLE Weight Bearing: Non weight bearing Pain: Pain Assessment Pain Score: 2    See Function Navigator for Current Functional Status.   Therapy/Group: Individual Therapy  Luberta Mutter 09/24/2016, 10:33 AM

## 2016-09-24 NOTE — Progress Notes (Signed)
Occupational Therapy Session Note  Patient Details  Name: Barbara Matthews MRN: [id number] Date of Birth: [birth year]  Today's Date: 09/24/2016 OT Individual Time: 1430-1530 OT Individual Time Calculation (min): 60 min    Short Term Goals: Week 1:  OT Short Term Goal 1 (Week 1): Pt will complete bathing with Max A sit<stand OT Short Term Goal 2 (Week 1): Pt will thread both legs into pants with use of AE PRN OT Short Term Goal 3 (Week 1): Pt will complete toilet transfer with LRAD and Max A OT Short Term Goal 4 (Week 1): Pt will engage in functional task for 10 minutes without rest to increase activity tolerance   Skilled Therapeutic Interventions/Progress Updates:    Pt seen seated in w/c with no c/o of pain and agreeable to tx. Pt requests to don pants prior to exiting room. Pt requires VC to dress L leg first, and uses reacher to thread B legs into pants. Pt demo improved carryover of reacher use as compared to previous sessions. Pt STS from w/c with RW with CGA for balance and A to advance pants over hips. Pt completes SPT to the R from w/c<>BSC with MOD A for lifting and VC for sequencing/WB precautions. Pt propels w/c to/from all therapeutic destinations to improve BUE strength with VC for propulsion techniques around various obstacles. Pt completes 2 STS from w/c at high/low table to play game of war with CGA-MIN A for lifting and balance. Pt stands for up to 10 min per trial. Pt returned to room with call light in reach and all needs met prior to exiting session.   Therapy Documentation Precautions:  Precautions Precautions: Fall, Knee Precaution Comments: NWB LLE Required Braces or Orthoses: Other Brace/Splint Other Brace/Splint: L knee brace for OOB; unrestricted ROM Restrictions Weight Bearing Restrictions: Yes LLE Weight Bearing: Non weight bearing General:   Vital Signs: Therapy Vitals Temp: 98.3 F (36.8 C) Temp Source: Oral Pulse Rate: 93 Resp: 16 BP: (!) 104/44 (rn  notify) Patient Position (if appropriate): Sitting Oxygen Therapy SpO2: 100 % O2 Device: Not Delivered Pain: Pain Assessment Pain Assessment: 0-10 Pain Score: 2  Pain Intervention(s): Medication (See eMAR) ADL: ADL ADL Comments: Please see functional navigator for ADL status Exercises:   Other Treatments:    See Function Navigator for Current Functional Status.   Therapy/Group: Individual Therapy  Tonny Branch 09/24/2016, 4:45 PM
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
## 2016-09-24 NOTE — Progress Notes (Signed)
Physical Therapy Weekly Progress Note  Patient Details  Name: Barbara Matthews MRN: [id number] Date of Birth: [birth year]  Beginning of progress report period: September 18, 2016 End of progress report period: September 24, 2016  Today's Date: 09/24/2016 PT Individual Time: 2423-5361 PT Individual Time Calculation (min): 45 min  Pt denies pain. Focused on active assisted L knee ROM without brace (able to get to about 15-20 degrees of flexion) and education on importance of addressing this daily. Pt verbalized understanding. Hip abduction x 10 reps and heel slides x 10 reps with active assist by PT. Educated on proper positioning of knee brace. Using leg lifter, pt able to get to EOB with supervision using bed rail for support and extra time. Min assist for slideboard transfer w/c with PT supporting LLE to prevent weigthbearing during transfer. W/c propulsion for UE strengthening and endurance at supervision level with cues for efficient technique. Sit <> stands with min assist and verbal cues for technique for adherence to NWB status. Pt able to gait x 3' forward with cues for sequencing and positioning of RW with difficulty adhering to NWB status during transitional movements.    Patient has met 5 of 5 short term goals. Pt is making good progress overall though continues with difficulty maintaining NWB precautions. Pt currently requires min to mod assist overall and assist with management of RLE but increasing independence with use of leg lifter. D/c plan is in progress due to patient's home not accessible at this time (10 steps and pt NWB on LLE), so awaiting decision by family to determine what home location to prepare for. Goals were downgraded to w/c level due to difficulty with maintaining NWB status and likely will need some assist with RLE management (min assist) at times.   Patient continues to demonstrate the following deficits muscle weakness, muscle joint tightness and WB restrictions,  decreased cardiorespiratoy endurance and decreased standing balance, decreased balance strategies and difficulty maintaining precautions and therefore will continue to benefit from skilled PT intervention to increase functional independence with mobility.  Patient progressing towards w/c level goals..  Plan of care revisions: Goals for gait and stairs have been discharged due to difficulty with WB status. .  PT Short Term Goals Week 1:  PT Short Term Goal 1 (Week 1): Pt will perform supine to sit and sit to supine with max A x 1 PT Short Term Goal 2 (Week 1): Pt will perform bed to chair transfer with RW, maintaining L LE NWB with max A x 1 PT Short Term Goal 3 (Week 1): Pt will ambulate 10 ft with RW with max A x 1, maintaining NWB on L LE. PT Short Term Goal 4 (Week 1): Pt will perform sit to stand with RW with max A x 1, maintaining NWB on L LE PT Short Term Goal 5 (Week 1): Pt will propel manual wheelchair for building UE strength for ambulation x 150 ft with supervision. Week 2:  PT Short Term Goal 1 (Week 2): = LTGS  Skilled Therapeutic Interventions/Progress Updates:  Ambulation/gait training;Functional mobility training;Psychosocial support;Therapeutic Activities;Wheelchair propulsion/positioning;Therapeutic Exercise;Neuromuscular re-education;Balance/vestibular training;DME/adaptive equipment instruction;Patient/family education;Community reintegration;Stair training;UE/LE Strength taining/ROM;Disease management/prevention;Pain management;Discharge planning;Skin care/wound management;UE/LE Coordination activities;Splinting/orthotics   Therapy Documentation Precautions:  Precautions Precautions: Fall, Knee Precaution Comments: unrestricted knee ROM; brace for OOB  Required Braces or Orthoses: Other Brace/Splint Other Brace/Splint:  (Hinged knee brace) Restrictions Weight Bearing Restrictions: Yes LLE Weight Bearing: Non weight bearing  See Function Navigator for Current Functional  Status.  Therapy/Group: Individual  Therapy  Canary Brim Ivory Broad, PT, DPT  09/24/2016, 2:03 PM

## 2016-09-25 ENCOUNTER — Inpatient Hospital Stay (HOSPITAL_COMMUNITY): Payer: BC Managed Care – PPO

## 2016-09-25 ENCOUNTER — Inpatient Hospital Stay (HOSPITAL_COMMUNITY): Payer: BC Managed Care – PPO | Admitting: *Deleted

## 2016-09-25 LAB — COMPREHENSIVE METABOLIC PANEL
ALBUMIN: 2.3 g/dL — AB (ref 3.5–5.0)
ALK PHOS: 169 U/L — AB (ref 38–126)
ALT: 25 U/L (ref 14–54)
AST: 20 U/L (ref 15–41)
Anion gap: 7 (ref 5–15)
BILIRUBIN TOTAL: 1.3 mg/dL — AB (ref 0.3–1.2)
BUN: 40 mg/dL — AB (ref 6–20)
CALCIUM: 9.5 mg/dL (ref 8.9–10.3)
CO2: 30 mmol/L (ref 22–32)
CREATININE: 0.85 mg/dL (ref 0.44–1.00)
Chloride: 98 mmol/L — ABNORMAL LOW (ref 101–111)
GFR calc Af Amer: >60 mL/min (ref >60)
GLUCOSE: 175 mg/dL — AB (ref 65–99)
Potassium: 4.8 mmol/L (ref 3.5–5.1)
Sodium: 135 mmol/L (ref 135–145)
TOTAL PROTEIN: 6.2 g/dL — AB (ref 6.5–8.1)

## 2016-09-25 LAB — GLUCOSE, CAPILLARY
GLUCOSE-CAPILLARY: 161 mg/dL — AB (ref 65–99)
GLUCOSE-CAPILLARY: 122 mg/dL — AB (ref 65–99)
GLUCOSE-CAPILLARY: 123 mg/dL — AB (ref 65–99)
GLUCOSE-CAPILLARY: 123 mg/dL — AB (ref 65–99)
Glucose-Capillary: 104 mg/dL — ABNORMAL HIGH (ref 65–99)
Glucose-Capillary: 106 mg/dL — ABNORMAL HIGH (ref 65–99)

## 2016-09-25 MED ORDER — TRACE MINERALS CR-CU-MN-SE-ZN 10-1000-500-60 MCG/ML IV SOLN
INTRAVENOUS | Status: AC
  Administered 2016-09-25: 18:00:00 via INTRAVENOUS
  Filled 2016-09-25: qty 1990
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
## 2016-09-25 NOTE — Progress Notes (Signed)
Physical Therapy Session Note  Patient Details  Name: Barbara Matthews MRN: [id number] Date of Birth: 23-Jan-1956  Today's Date: 09/25/2016 PT Individual Time: 0805-0905 PT Individual Time Calculation (min): 60 min   Short Term Goals: Week 2:  PT Short Term Goal 1 (Week 2): = LTGS  Skilled Therapeutic Interventions/Progress Updates:  Tx focused on therex for strengthening and ROM, transfers, and gait with RW. Pt reports no pain at rest. Received in bed.  Supine therex bil x10: ankle pumps, quad sets glute sets,  AAROM heel slides, SAQ, hip ABD/ADD , L knee ROM without brace (able to get to about 15-20 degrees of flexion) and reinforced importance of addressing this daily. Assisted donning brace and reminded pt of wear schedule and precautions.   Supine>sit S with safety cues., increased time required.   Sit<>stand Mod A to gait x8' with RW and Mod A with demonstration for technique and sequence cues, technique cues for full elbow ext during swing. Pt able to maintain NWB.  Toilet transfer with Mod A WC<>toilet.   Pt left up in [facility] with all needs in reach.       Therapy Documentation Precautions:  Precautions Precautions: Fall, Knee Precaution Comments: NWB LLE Required Braces or Orthoses: Other Brace/Splint Other Brace/Splint: L knee brace for OOB; unrestricted ROM Restrictions Weight Bearing Restrictions: Yes LLE Weight Bearing: Non weight bearing General:   Vital Signs: Therapy Vitals Temp: 98.4 F (36.9 C) Temp Source: Oral Pulse Rate: 98 Resp: 17 BP: (!) 132/59 Patient Position (if appropriate): Lying Oxygen Therapy SpO2: 97 % O2 Device: Not Delivered Pain: none    See Function Navigator for Current Functional Status.   Therapy/Group: Individual Therapy  Lerin Jech Soundra Pilon, PT, DPT  09/25/2016, 7:47 AM

## 2016-09-25 NOTE — Progress Notes (Signed)
Occupational Therapy Session Note  Patient Details  Name: Barbara Matthews MRN: [id number] Date of Birth: 1955/12/12  Today's Date: 09/25/2016 OT Individual Time: 1500-1530 OT Individual Time Calculation (min): 30 min   Short Term Goals: Week 1:  OT Short Term Goal 1 (Week 1): Pt will complete bathing with Max A sit<stand OT Short Term Goal 2 (Week 1): Pt will thread both legs into pants with use of AE PRN OT Short Term Goal 3 (Week 1): Pt will complete toilet transfer with LRAD and Max A OT Short Term Goal 4 (Week 1): Pt will engage in functional task for 10 minutes without rest to increase activity tolerance   Skilled Therapeutic Interventions/Progress Updates: ADL-retraining with emphasis on transfers using RW (bed<>w/c, w/c<>toilet), improved management of LLE using AE (leg lifter), and improved activity tolerance.   Pt received in bed and requesitng use of bed pan to void urine.   OT advised practice with use of AE, tranfers, and LRAD (RW) to void on toilet instead of bedpan.   With extra time, setup to manage colostomy bag, and vc to use leg lifter, pt completed bed mobility, stand pivot transfers and voided urine seated on toilet with overall min assist (steadying) during transfers and setup to problem-solve.   Pt sustained participation in task for entire session w/o complaint (30 min) or need of rest break.  Pt returned to bed at end of session, lifting her leg using leg lifter independently after vc to use AE.     Therapy Documentation Precautions:  Precautions Precautions: Fall, Knee Precaution Comments: NWB LLE Required Braces or Orthoses: Other Brace/Splint Other Brace/Splint: L knee brace for OOB; unrestricted ROM Restrictions Weight Bearing Restrictions: Yes LLE Weight Bearing: Non weight bearing    Vital Signs: Therapy Vitals Temp: 98.8 F (37.1 C) Temp Source: Oral Pulse Rate: 88 Resp: 18 BP: 112/61 Patient Position (if appropriate): Lying Oxygen Therapy SpO2:  100 % O2 Device: Not Delivered   Pain: Pain Assessment Pain Assessment: No/denies pain   ADL: ADL ADL Comments: Please see functional navigator for ADL status   See Function Navigator for Current Functional Status.   Therapy/Group: Individual Therapy  [place] 09/25/2016, 4:11 PM
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
## 2016-09-26 ENCOUNTER — Inpatient Hospital Stay (HOSPITAL_COMMUNITY): Payer: BC Managed Care – PPO | Admitting: Physical Therapy

## 2016-09-26 LAB — GLUCOSE, CAPILLARY
GLUCOSE-CAPILLARY: 129 mg/dL — AB (ref 65–99)
GLUCOSE-CAPILLARY: 131 mg/dL — AB (ref 65–99)
GLUCOSE-CAPILLARY: 83 mg/dL (ref 65–99)
Glucose-Capillary: 86 mg/dL (ref 65–99)
Glucose-Capillary: 210 mg/dL — ABNORMAL HIGH (ref 65–99)

## 2016-09-26 MED ORDER — TRACE MINERALS CR-CU-MN-SE-ZN 10-1000-500-60 MCG/ML IV SOLN
INTRAVENOUS | Status: AC
Start: 2016-09-26 — End: 2016-09-27
  Administered 2016-09-26: 18:00:00 via INTRAVENOUS
  Filled 2016-09-26: qty 1990
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
## 2016-09-26 NOTE — Progress Notes (Signed)
Physical Therapy Session Note  Patient Details  Name: Barbara Matthews MRN: [id number] Date of Birth: [birth year]  Today's Date: 09/26/2016 PT Individual Time: 1015-1100 PT Individual Time Calculation (min): 45 min   Short Term Goals: Week 2:  PT Short Term Goal 1 (Week 2): = LTGS  Skilled Therapeutic Interventions/Progress Updates: Pt received supine in bed, denies pain at rest and agreeable to treatment. LLE heel slide AAROM using leg lifter 2x10 reps. LLE brace doffed/donned totalA. Supine>sit with S and bedrails, leg lifter. Squat pivot transfer bed>w/c with minA. W/c propulsion x100' with BUE for strengthening and endurance. Stand pivot transfer w/c <>flat bed in ADL apartment with min guard. Sit >supine min guard for LLE management, use of leg lifter. Supine>sit with S and leg lifter. Gait x10' with min guard and RW; maintains LLE NWB with min cues. Remained seated in w/c at end of session, all needs in reach.      Therapy Documentation Precautions:  Precautions Precautions: Fall, Knee Precaution Comments: NWB LLE Required Braces or Orthoses: Other Brace/Splint Other Brace/Splint: L knee brace for OOB; unrestricted ROM Restrictions Weight Bearing Restrictions: Yes LLE Weight Bearing: Non weight bearing Pain: Pain Assessment Pain Assessment: No/denies pain Pain Score: 0-No pain   See Function Navigator for Current Functional Status.   Therapy/Group: Individual Therapy  Luberta Mutter 09/26/2016, 11:01 AM
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
## 2016-09-27 ENCOUNTER — Inpatient Hospital Stay (HOSPITAL_COMMUNITY): Payer: BC Managed Care – PPO

## 2016-09-27 ENCOUNTER — Inpatient Hospital Stay (HOSPITAL_COMMUNITY): Payer: BC Managed Care – PPO | Admitting: Physical Therapy

## 2016-09-27 LAB — COMPREHENSIVE METABOLIC PANEL
ALBUMIN: 2.6 g/dL — AB (ref 3.5–5.0)
ALT: 26 U/L (ref 14–54)
ANION GAP: 7 (ref 5–15)
AST: 22 U/L (ref 15–41)
Alkaline Phosphatase: 202 U/L — ABNORMAL HIGH (ref 38–126)
BILIRUBIN TOTAL: 1.6 mg/dL — AB (ref 0.3–1.2)
BUN: 35 mg/dL — AB (ref 6–20)
CALCIUM: 9.9 mg/dL (ref 8.9–10.3)
CO2: 30 mmol/L (ref 22–32)
Chloride: 98 mmol/L — ABNORMAL LOW (ref 101–111)
Creatinine, Ser: 0.76 mg/dL (ref 0.44–1.00)
GFR calc Af Amer: >60 mL/min (ref >60)
GFR calc non Af Amer: >60 mL/min (ref >60)
GLUCOSE: 155 mg/dL — AB (ref 65–99)
Potassium: 4.9 mmol/L (ref 3.5–5.1)
SODIUM: 135 mmol/L (ref 135–145)
TOTAL PROTEIN: 6.8 g/dL (ref 6.5–8.1)

## 2016-09-27 LAB — CBC
HEMATOCRIT: 32.7 % — AB (ref 36.0–46.0)
Hemoglobin: 9.8 g/dL — ABNORMAL LOW (ref 12.0–15.0)
MCH: 25.6 pg — AB (ref 26.0–34.0)
MCHC: 30 g/dL (ref 30.0–36.0)
MCV: 85.4 fL (ref 78.0–100.0)
PLATELETS: 336 10*3/uL (ref 150–400)
RBC: 3.83 MIL/uL — AB (ref 3.87–5.11)
RDW: 18.4 % — ABNORMAL HIGH (ref 11.5–15.5)
WBC: 5.3 10*3/uL (ref 4.0–10.5)

## 2016-09-27 LAB — GLUCOSE, CAPILLARY
GLUCOSE-CAPILLARY: 205 mg/dL — AB (ref 65–99)
GLUCOSE-CAPILLARY: 69 mg/dL (ref 65–99)
GLUCOSE-CAPILLARY: 101 mg/dL — AB (ref 65–99)
Glucose-Capillary: 101 mg/dL — ABNORMAL HIGH (ref 65–99)
Glucose-Capillary: 115 mg/dL — ABNORMAL HIGH (ref 65–99)
Glucose-Capillary: 132 mg/dL — ABNORMAL HIGH (ref 65–99)
Glucose-Capillary: 154 mg/dL — ABNORMAL HIGH (ref 65–99)
Glucose-Capillary: 159 mg/dL — ABNORMAL HIGH (ref 65–99)

## 2016-09-27 LAB — DIFFERENTIAL
Basophils Absolute: 0 10*3/uL (ref 0.0–0.1)
Basophils Relative: 1 %
EOS PCT: 3 %
Eosinophils Absolute: 0.2 10*3/uL (ref 0.0–0.7)
LYMPHS PCT: 18 %
Lymphs Abs: 0.9 10*3/uL (ref 0.7–4.0)
MONO ABS: 0.3 10*3/uL (ref 0.1–1.0)
MONOS PCT: 6 %
NEUTROS ABS: 3.9 10*3/uL (ref 1.7–7.7)
Neutrophils Relative %: 72 %

## 2016-09-27 LAB — MAGNESIUM: Magnesium: 2 mg/dL (ref 1.7–2.4)

## 2016-09-27 LAB — TRIGLYCERIDES: Triglycerides: 106 mg/dL (ref <150)

## 2016-09-27 LAB — PREALBUMIN: Prealbumin: 29.5 mg/dL (ref 18–38)

## 2016-09-27 LAB — PHOSPHORUS: Phosphorus: 4 mg/dL (ref 2.5–4.6)

## 2016-09-27 MED ORDER — M.V.I. ADULT IV INJ
INTRAVENOUS | Status: AC
  Administered 2016-09-27: 18:00:00 via INTRAVENOUS
  Filled 2016-09-27: qty 1990

## 2016-09-27 MED ORDER — FAT EMULSION 20 % IV EMUL
240.0000 mL | INTRAVENOUS | Status: AC
  Administered 2016-09-27: 240 mL via INTRAVENOUS
  Filled 2016-09-27: qty 250

## 2016-09-27 NOTE — Progress Notes (Addendum)
Physical Therapy Note  Patient Details  Name: Mysti Haley MRN: [id number] Date of Birth: [birth year] Today's Date: 09/27/2016  1605-1705, 60 min individual tx Pain: no c/o  W/c propulsion using bil UEs x 150' with supervision, extra time.  Slide board transfer level w/c> mat with mod cues for sequencing, min assist for managing L ELR. Therapeutic exercise performed with LE to increase strength for functional mobility: in supine: 10 x 2 L ankle pumps,  R straight leg raises, R unilateral bridging with PT raising LLE, L hip abduction/adduction, bil ankle circles; in R sidelying L hip extension with assist of PT to hold wt of LLE.  Marland Kitchen  Use of leg lifter to move R sidelying> sitting.  Stand pivot mat> w/c to R with RW, min assist, mod cues to avoid LLE wt bearing and for technique.  Pt left resting in w/c with all needs within reach.  See function navigator for current status.   Aliese Brannum 09/27/2016, 4:23 PM

## 2016-09-27 NOTE — Progress Notes (Signed)
Hypoglycemic Event  CBG: 69  Treatment: 15 GM carbohydrate snack  Symptoms: None  Follow-up CBG: Time:1830 CBG Result:101  Possible Reasons for Event: Inadequate meal intake  Comments/MD notified:  Pt given SSI coverage for CBG of 192 however did not eat but a 1/4 serving of her lunch. Given peanut butter crackers and yogurt for a snack when blood sugar was low. Asked about symptoms, reported felt a little tired was all. Reviewed need to eat a meal or a snack if takes insulin for her blood sugar level.  Margarito Liner

## 2016-09-27 NOTE — Progress Notes (Signed)
Occupational Therapy Session Note  Patient Details  Name: Barbara Matthews MRN: [id number] Date of Birth: 1955-08-14  Today's Date: 09/27/2016 OT Individual Time:  11:16- 11:58 and 1:32-2:00   42 min and 28 min   Short Term Goals: Week 2:     Skilled Therapeutic Interventions/Progress Updates:   Session 1: Pt seen seated in w/c upon arrival, with no c/o pain and agreeable to tx. Educated pt about proper positioning of RUE in w/c. Pt propels w/c throughout room with VC for tight turning techniques. With SET UP for gathering items, pt washes UB at sink level while seated in w/c. Pt reports wanting to change colostomy bag and propels w/c around room to gather needed items. Pt able to direct care throughout process to ask for assistance and instruct needs. Pt squat pivot transfer w/c>EOB with CGA and VC for weight bearing precautions. Pt manages RLE with leg lifter after question cueing to identify AE use. Pt cleans off peri area d/t leaking colostomy and sitting EOB> supine with supervision. RN arrives to A with changing bag. Pt left in room  In bed with RN and all needs met.  Session 2: Pt supine in bed upon arrival, agreeable to tx and reports pain without giving numeric value. RN notified and provided medication. Pt supine>EOB with supervision and VC to use leg lifter. Pt SPT EOB>w/c with supervision for safety awareness and pt propels w/c to sink to wash peri area in standing with supervision for balance/WB precautions. Pt threads pants over hips with VC for reacher use. Pt able to advance pants over hips in standing with supervision. Pt required increased time throughout session in between transitional movements d/t increased pain in L knee. Pt left in room in w/c with all needs met and call light in reach.  Therapy Documentation Precautions:  Precautions Precautions: Fall, Knee Precaution Comments: NWB LLE Required Braces or Orthoses: Other Brace/Splint Other Brace/Splint: L knee brace for  OOB; unrestricted ROM Restrictions Weight Bearing Restrictions: Yes LLE Weight Bearing: Non weight bearing General:   Vital Signs:  Pain:   ADL: ADL ADL Comments: Please see functional navigator for ADL status Exercises:   Other Treatments:    See Function Navigator for Current Functional Status.   Therapy/Group: Individual Therapy  Tonny Branch 09/27/2016, 12:12 PM

## 2016-09-27 NOTE — Progress Notes (Signed)
Physical Therapy Session Note  Patient Details  Name: Barbara Matthews MRN: [id number] Date of Birth: Oct 03, 1955  Today's Date: 09/27/2016 PT Individual Time: 0901-1000 PT Individual Time Calculation (min): 59 min   Short Term Goals: Week 2:  PT Short Term Goal 1 (Week 2): = LTGS  Skilled Therapeutic Interventions/Progress Updates: Pt presented in bed agreeable to therapy. Performed supine to sit at EOB with supervision and use of leg lifter. Performed sit to stand, stand pivot to w/c with additional time required.  Performed squat pivot transfer with minA and moderate cues for maintaining NWB. Returned to w/c in same manner with improved technique and maintaining wt bearing precautions. Pt instructed in applying leg extender onto w/c requiring additional time and mod/maxA. Continued practice sit to stand from w/c with RW with pt maintaining NWB.  Gait 25f with RW with pt demonstrating short hop length at times shuffling RLE forward. Pt encouraged to increased UE wt bearing through RW to facilitate forward RLE. Pt returned to room at end of session in w/c with all current needs met.      Therapy Documentation Precautions:  Precautions Precautions: Fall, Knee Precaution Comments: NWB LLE Required Braces or Orthoses: Other Brace/Splint Other Brace/Splint: L knee brace for OOB; unrestricted ROM Restrictions Weight Bearing Restrictions: Yes LLE Weight Bearing: Non weight bearing  See Function Navigator for Current Functional Status.   Therapy/Group: Individual Therapy  Martisha Toulouse  Keiran Gaffey, PTA  09/27/2016, 12:12 PM
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
## 2016-09-28 ENCOUNTER — Inpatient Hospital Stay (HOSPITAL_COMMUNITY): Payer: BC Managed Care – PPO

## 2016-09-28 ENCOUNTER — Inpatient Hospital Stay (HOSPITAL_COMMUNITY): Payer: BC Managed Care – PPO | Admitting: Physical Therapy

## 2016-09-28 LAB — GLUCOSE, CAPILLARY
GLUCOSE-CAPILLARY: 197 mg/dL — AB (ref 65–99)
Glucose-Capillary: 96 mg/dL (ref 65–99)
Glucose-Capillary: 99 mg/dL (ref 65–99)
Glucose-Capillary: 176 mg/dL — ABNORMAL HIGH (ref 65–99)
Glucose-Capillary: 164 mg/dL — ABNORMAL HIGH (ref 65–99)

## 2016-09-28 MED ORDER — TRACE MINERALS CR-CU-MN-SE-ZN 10-1000-500-60 MCG/ML IV SOLN
INTRAVENOUS | Status: AC
  Administered 2016-09-28: 18:00:00 via INTRAVENOUS
  Filled 2016-09-28: qty 1990

## 2016-09-28 MED ORDER — INSULIN ASPART 100 UNIT/ML ~~LOC~~ SOLN
0.0000 [IU] | SUBCUTANEOUS | Status: DC
  Administered 2016-09-28 – 2016-09-29 (×5): 3 [IU] via SUBCUTANEOUS
  Administered 2016-09-30: 2 [IU] via SUBCUTANEOUS
  Administered 2016-10-01: 3 [IU] via SUBCUTANEOUS

## 2016-09-28 NOTE — Progress Notes (Signed)
Physical Therapy Session Note  Patient Details  Name: Laelia Angelo MRN: [id number] Date of Birth: 09-27-1955  Today's Date: 09/28/2016 PT Individual Time: 0807-0903 PT Individual Time Calculation (min): 56 min   Short Term Goals: Week 2:  PT Short Term Goal 1 (Week 2): = LTGS  Skilled Therapeutic Interventions/Progress Updates: Pt presented in bed agreeable to therapy. Performed supine to sit with min guard with pt using leg lifter for LLE placement. SB transfer to w/c with modA for sequencing and maintaining NWB on LLE.  Pt propelled self to rehab gym for UE strengthening and endurance. Performed squat pivot transfer w/c to mat x 6 modA progressing to minA for sequencing and maintaining NWB status. Propelled self back to room for continued endurance. Pt remained in chair at end of session with all needs met.      Therapy Documentation Precautions:  Precautions Precautions: Fall, Knee Precaution Comments: NWB LLE Required Braces or Orthoses: Other Brace/Splint Other Brace/Splint: L knee brace for OOB; unrestricted ROM Restrictions Weight Bearing Restrictions: Yes LLE Weight Bearing: Non weight bearing   See Function Navigator for Current Functional Status.   Therapy/Group: Individual Therapy  Saulo Anthis  Nereida Schepp, PTA  09/28/2016, 10:21 AM
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
## 2016-09-28 NOTE — Plan of Care (Signed)
Problem: RH Bed to Chair Transfers Goal: LTG Patient will perform bed/chair transfers w/assist (PT) LTG: Patient will perform bed/chair transfers with assistance, with/without cues (PT).  Upgraded due to progress ABG

## 2016-09-28 NOTE — Progress Notes (Signed)
Physical Therapy Session Note  Patient Details  Name: Barbara Matthews MRN: [id number] Date of Birth: Dec 18, 1955  Today's Date: 09/28/2016 PT Individual Time: 1500-1530 PT Individual Time Calculation (min): 30 min   Short Term Goals: Week 2:  PT Short Term Goal 1 (Week 2): = LTGS  Skilled Therapeutic Interventions/Progress Updates: Pt received seated in w/c, c/o mild pain as below and agreeable to treatment. Sit <>stand with S throughout session. Gait 2x15' and 1x25' with RW and min guard; assist for stabilizing RW as pt frequently pushing walker too far ahead of her body and walker rolling while pt attempting to step with RLE. Stand pivot transfer to return to bed with RW and S. Sit >supine with S and leg lifter. Remained supine at end of session, alarm intact and all needs in reach.      Therapy Documentation Precautions:  Precautions Precautions: Fall, Knee Precaution Comments: NWB LLE Required Braces or Orthoses: Other Brace/Splint Other Brace/Splint: L knee brace for OOB; unrestricted ROM Restrictions Weight Bearing Restrictions: Yes LLE Weight Bearing: Non weight bearing Pain: Pain Assessment Pain Score: 1    See Function Navigator for Current Functional Status.   Therapy/Group: Individual Therapy  Luberta Mutter 09/28/2016, 3:39 PM

## 2016-09-28 NOTE — Plan of Care (Signed)
Problem: RH Wheelchair Mobility Goal: LTG Patient will propel w/c in home environment (PT) LTG: Patient will propel wheelchair in home environment, # of feet with assistance (PT).  Goal upgraded ABG  Problem: RH Wheelchair Mobility Goal: LTG Patient will propel w/c in controlled environment (PT) LTG: Patient will propel wheelchair in controlled environment, # of feet with assist (PT) Goal added. ABG

## 2016-09-28 NOTE — Progress Notes (Addendum)
Physical Therapy Session Note  Patient Details  Name: Barbara Matthews MRN: [id number] Date of Birth: April 23, 1956  Today's Date: 09/30/2016 PT Individual Time: 0930-1030 PT Individual Time Calculation (min): 60 min   Short Term Goals: Week 2:  PT Short Term Goal 1 (Week 2): = LTGS  Skilled Therapeutic Interventions/Progress Updates:   Focused on d/c planning and education (in regards to home access (10 stairs and technique), transportation home and technique for car transfer, and home set-up for accessibility for w/c vs. RW). Pt reports she does not know who is going to pick her up and is unaware of plans for family education tomorrow. In discussion with CSW still awaiting confirmation of time for this occur tomorrow to perform education and training on technique to bump w/c up/down 10 steps. Discussed with patient the need for 2 caregivers to complete this safely. Pt able to complete stand pivot transfers with RW and bed mobility with leg lifter today at supervision level with extra time. Simulated car transfer using RW demonstrating technique to sit in front seat vs. Sitting in long sitting in the back seat (recommended if long sitting, to enter on the driver's side to keep LLE propped on seat safely). Repeated x 2 reps with min assist to manage LLE and educated on differences. Pt reports she is unsure what kind of car she would go home in due to not knowing who would pick her up. Pt able to gait with supervision x 30' today with cues for efficient technique. Discussed importance of daily ROM to LLE out of brace to increase knee flexion to decrease risk of contracture. Pt verbalized understanding but expressed concern in being able to don brace independently. Recommended to practice in next OT session when completing bathing and dressing.   Therapy Documentation Precautions:  Precautions Precautions: Fall, Knee Precaution Comments: NWB LLE Required Braces or Orthoses: Other Brace/Splint Other  Brace/Splint: L knee brace for OOB; unrestricted ROM Restrictions Weight Bearing Restrictions: Yes LLE Weight Bearing: Non weight bearing   Pain: Premedicated at start of session for pain by RN  See Function Navigator for Current Functional Status.   Therapy/Group: Individual Therapy  Canary Brim Ivory Broad, PT, DPT  09/30/2016, 10:44 AM

## 2016-09-28 NOTE — Progress Notes (Signed)
Occupational Therapy Session Note  Patient Details  Name: Barbara Matthews MRN: [id number] Date of Birth: 10-27-1955  Today's Date: 09/28/2016 OT Individual Time: 1103-1200and 1302-1400 OT Individual Time Calculation (min): 58 min and 58 min   Short Term Goals: Week 2:     Skilled Therapeutic Interventions/Progress Updates:    Pt seated in w/c upon entering with no complaints of pain and agreeable to tx. Focus of session on self care and functional mobility. Pt propels w/c throughout room and to/from therapeutic destinations to improve BUE endurance and w/c mobility with supervision and increased time for avoiding obstacles. Pt UB/LB at sink with supervision while sitting and CGA when standing to wash buttocks/peri area. Pt dons pull over shirt with SET UP to obtain items and pants with supervision/VC for reacher technique. In ADL apartment, pt SPT w/c <> high level bed and uses leg lifter to manage LLE with VC for safety awareness and WB precautions. Pt returned to room seated in w/c with call light in reach and all needs met.   Session 2: Upon entering room pt in w/c with no complaints of pain and agreeable to tx/ Focus of session on functional mobility, energy conservation and w/c management in prep for d/c. Pt reports wanting to finish breakfast. Pt propels w/c throughout room and to/from therapeutic destinations to improve BUE endurance and w/c mobility with supervision and increased time for avoiding obstacles. In dayroom, Pt SPT with RW w/c <> EOM to eat lunch in unsupported short sit. Pt SPT w/c <> couch with CGA for balance during stand from couch level and VC to push from arm rest. Pt practices setting up for transfers by managing leg rests with MIN A-supervision to take on/off w/c. In ADL kitchen, Pt educated on conservation techniques, safe w/c mobility/ standing at counter and organization so items are in reach at home in prep for d/c. Pt reaches for items at various levels from w/c with  reacher and repeats strategies. Pt returned to room seated in w/c with call light in reach and all needs met.  Therapy Documentation Precautions:  Precautions Precautions: Fall, Knee Precaution Comments: NWB LLE Required Braces or Orthoses: Other Brace/Splint Other Brace/Splint: L knee brace for OOB; unrestricted ROM Restrictions Weight Bearing Restrictions: Yes LLE Weight Bearing: Non weight bearing General:   Vital Signs: Therapy Vitals Temp: 98.3 F (36.8 C) Temp Source: Oral Pulse Rate: 82 Resp: 18 BP: (!) 110/44 Patient Position (if appropriate): Sitting Oxygen Therapy SpO2: 98 % O2 Device: Not Delivered Pain: Pain Assessment Pain Assessment: 0-10 Pain Score: 1  Pain Location: Leg Pain Orientation: Left Pain Descriptors / Indicators: Aching Patients Stated Pain Goal: 2 Pain Intervention(s): Medication (See eMAR) ADL: ADL ADL Comments: Please see functional navigator for ADL status Exercises:   Other Treatments:    See Function Navigator for Current Functional Status.   Therapy/Group: Individual Therapy  Tonny Branch 09/28/2016, 2:54 PM

## 2016-09-29 ENCOUNTER — Inpatient Hospital Stay (HOSPITAL_COMMUNITY): Payer: BC Managed Care – PPO | Admitting: Physical Therapy

## 2016-09-29 ENCOUNTER — Inpatient Hospital Stay (HOSPITAL_COMMUNITY): Payer: BC Managed Care – PPO

## 2016-09-29 DIAGNOSIS — E871 Hypo-osmolality and hyponatremia: Secondary | ICD-10-CM

## 2016-09-29 LAB — BASIC METABOLIC PANEL
ANION GAP: 7 (ref 5–15)
BUN: 54 mg/dL — ABNORMAL HIGH (ref 6–20)
CALCIUM: 9.6 mg/dL (ref 8.9–10.3)
CO2: 29 mmol/L (ref 22–32)
CREATININE: 0.93 mg/dL (ref 0.44–1.00)
Chloride: 97 mmol/L — ABNORMAL LOW (ref 101–111)
Glucose, Bld: 117 mg/dL — ABNORMAL HIGH (ref 65–99)
Potassium: 4.6 mmol/L (ref 3.5–5.1)
Sodium: 133 mmol/L — ABNORMAL LOW (ref 135–145)

## 2016-09-29 LAB — GLUCOSE, CAPILLARY
GLUCOSE-CAPILLARY: 100 mg/dL — AB (ref 65–99)
GLUCOSE-CAPILLARY: 114 mg/dL — AB (ref 65–99)
GLUCOSE-CAPILLARY: 84 mg/dL (ref 65–99)
GLUCOSE-CAPILLARY: 94 mg/dL (ref 65–99)
Glucose-Capillary: 129 mg/dL — ABNORMAL HIGH (ref 65–99)
Glucose-Capillary: 165 mg/dL — ABNORMAL HIGH (ref 65–99)
Glucose-Capillary: 170 mg/dL — ABNORMAL HIGH (ref 65–99)

## 2016-09-29 MED ORDER — M.V.I. ADULT IV INJ
INTRAVENOUS | Status: AC
  Administered 2016-09-29: 17:00:00 via INTRAVENOUS
  Filled 2016-09-29: qty 1990

## 2016-09-29 MED ORDER — FAT EMULSION 20 % IV EMUL
240.0000 mL | INTRAVENOUS | Status: AC
Start: 2016-09-29 — End: 2016-09-30
  Administered 2016-09-29: 240 mL via INTRAVENOUS
  Filled 2016-09-29: qty 250

## 2016-09-29 NOTE — Plan of Care (Signed)
Problem: RH BOWEL ELIMINATION Goal: RH STG MANAGE BOWEL W/EQUIPMENT W/ASSISTANCE STG Manage Bowel With Equipment With min Assistance  Outcome: Completed/Met Date Met: 09/29/16 Pt managed at home. No new issues with ostomy

## 2016-09-29 NOTE — Progress Notes (Signed)
Occupational Therapy Weekly Progress Note  Patient Details  Name: Barbara Matthews MRN: [id number] Date of Birth: Oct 01, 1955  Beginning of progress report period: September 18, 2016 End of progress report period: September 28, 2016  Today's Date: 09/29/2016 OT Individual Time:  10:00- 11:00 and 14:00-14:28   60 min and 28 min   Patient has made good progress this week and has met 4 of 4 short term goals. Pt is supervision-CGA for sponge bathing, dressing, toileting/toilet transfers, and set up-mod I for grooming and eating. Pt requires intermittant cueing for safety awareness and set up for w/c management prior to transfers. Pt has improved standing balance/ tolerance requiring supervision-CGA for standing during functional activities, endurance, activity tolerance, tolerance to sitting with LLE unsupported, and LE management throughout functional mobility. Limiting factors to progress are pain in L knee, difficulty problem solving safe transfer set up, decreased endurance and mobility with orthosis, and decreased safety awareness. Pt plans to possibly d/c home to apartment with A from family.   Patient continues to demonstrate the following deficits: muscle weakness and muscle joint tightness and decreased cardiorespiratoy endurance and therefore will continue to benefit from skilled OT intervention to enhance overall performance with BADL.  Patient progressing toward long term goals..  Continue plan of care.  OT Short Term Goals Week 1:  OT Short Term Goal 1 (Week 1): Pt will complete bathing with Max A sit<stand OT Short Term Goal 1 - Progress (Week 1): Met OT Short Term Goal 2 (Week 1): Pt will thread both legs into pants with use of AE PRN OT Short Term Goal 2 - Progress (Week 1): Met OT Short Term Goal 3 (Week 1): Pt will complete toilet transfer with LRAD and Max A OT Short Term Goal 3 - Progress (Week 1): Met OT Short Term Goal 4 (Week 1): Pt will engage in functional task for 10  minutes without rest to increase activity tolerance  OT Short Term Goal 4 - Progress (Week 1): Met  Skilled Therapeutic Interventions/Progress Updates:    Session 1: Pt seen seated in w/c with no complaints of pain and agreeable to tx. Focus of session on ADL retraining and functional mobility. Pt grooms at sink with set up assist. Pt bathes UB at sink with set up and refuses bathing LB. Pt dons hospital gown with set up and dons pants, L sock, and L shoe with supervision and VC for reacher use, posture, and safety awareness. Pt STS at high low table from w/c with supervision as she reaches outside BOS for pieces to play a game of checkers.  Pt demo improved adherence to weight bearing precautions as evidenced by not requiring Vc this date. Pt returned to room seated in w/c with call light in reach and all needs met.   Session 2: Pt seen in w/c with no complaints of pain and agreeable to tx. Focus of session on ADL retraining and commode transfers. Pt unable to problem solve setting up w/c for transfer to [facility], OT provided cueing. Pt stand pivot transfers from w/c to [facility] with RW x3 with VC for safety awareness. Pt reports needing to void urine and completes clothing management and hygiene with CGA for balance. Pt able to maintain WB precautions throughout session without Vc. Pt left in room seated in w/c with call light in reach.   Therapy Documentation Precautions:  Precautions Precautions: Fall, Knee Precaution Comments: NWB LLE Required Braces or Orthoses: Other Brace/Splint Other Brace/Splint: L knee brace for OOB; unrestricted ROM  Restrictions Weight Bearing Restrictions: Yes LLE Weight Bearing: Non weight bearing General:   Vital Signs: Therapy Vitals Temp: 98.7 F (37.1 C) Temp Source: Oral Pulse Rate: 88 Resp: 18 BP: (!) 103/50 Patient Position (if appropriate): Lying Oxygen Therapy SpO2: 100 % O2 Device: Not Delivered Pain:   ADL: ADL ADL Comments: Please see functional  navigator for ADL status Exercises:   Other Treatments:    See Function Navigator for Current Functional Status.   Therapy/Group: Individual Therapy  Tonny Branch 09/29/2016, 6:43 AM
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
## 2016-09-29 NOTE — Progress Notes (Signed)
Physical Therapy Session Note  Patient Details  Name: Barbara Matthews MRN: [id number] Date of Birth: August 11, 1955  Today's Date: 09/29/2016 PT Individual Time: 0800-0905 & 1660-6004 PT Individual Time Calculation (min): 65 min & 45 min   Short Term Goals: Week 2:  PT Short Term Goal 1 (Week 2): = LTGS  Skilled Therapeutic Interventions/Progress Updates:    Tx1: Pt presented in bed agreeable to therapy. Performed supine to sit with supervision, HOB elevated and use of leg lifter. Trialed stand pivot transfer to w/c with pt requiring min guard for sit to stand and minA for stand pivot requiring cues for sequencing and min/mod cues for maintaining NWB. Pt required mod/maxA for placing leg extended but able to lock into place and place leg with supervision. Pt propelled to rehab gym with minA for negotiating as pt had some difficulty clearing turns. Continued stand pivot transfer w/c to mat x2 with minA progressing to min guard with improved compliance of NWB during transfer. Performed weaving through cones with modA due to difficulty avoiding cones. Pt propelled back to room and remained in w/c with call bell within reach and needs met.   Tx2: Pt presented in recliner chair. Performed sit to stand with min guard with x1 occuranceof toes touching ground. Pt able to hop x80f to w/c pivot and hop 357fback to w/c maintaining NWB status throughout.  Mod verbal cues required for sequencing. Pt instructed in ramp negotiation with w/c requiring mod/max A progressing to modA with multiple trials. Pt performed weaving around cones requiring modA for negotiation. Pt propelled >20052for continued endurance and UE strengthening. Pt returned to room with call bell within reach awaiting next therapy session.    Therapy Documentation Precautions:  Precautions Precautions: Fall, Knee Precaution Comments: NWB LLE Required Braces or Orthoses: Other Brace/Splint Other Brace/Splint: L knee brace for OOB; unrestricted  ROM Restrictions Weight Bearing Restrictions: Yes LLE Weight Bearing: Non weight bearing Pain: Pain Assessment Pain Score: 0-No pain   See Function Navigator for Current Functional Status.   Therapy/Group: Individual Therapy  Henretta Quist  Shihab States, PTA 09/29/2016, 10:48 AM
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
## 2016-09-30 ENCOUNTER — Inpatient Hospital Stay (HOSPITAL_COMMUNITY): Payer: BC Managed Care – PPO

## 2016-09-30 ENCOUNTER — Inpatient Hospital Stay (HOSPITAL_COMMUNITY): Payer: BC Managed Care – PPO | Admitting: Occupational Therapy

## 2016-09-30 LAB — GLUCOSE, CAPILLARY
GLUCOSE-CAPILLARY: 60 mg/dL — AB (ref 65–99)
Glucose-Capillary: 108 mg/dL — ABNORMAL HIGH (ref 65–99)
Glucose-Capillary: 106 mg/dL — ABNORMAL HIGH (ref 65–99)
Glucose-Capillary: 96 mg/dL (ref 65–99)
Glucose-Capillary: 149 mg/dL — ABNORMAL HIGH (ref 65–99)

## 2016-09-30 LAB — COMPREHENSIVE METABOLIC PANEL
ALBUMIN: 2.6 g/dL — AB (ref 3.5–5.0)
ALK PHOS: 207 U/L — AB (ref 38–126)
ALT: 26 U/L (ref 14–54)
ANION GAP: 7 (ref 5–15)
AST: 24 U/L (ref 15–41)
BUN: 51 mg/dL — ABNORMAL HIGH (ref 6–20)
CALCIUM: 10 mg/dL (ref 8.9–10.3)
CO2: 30 mmol/L (ref 22–32)
Chloride: 99 mmol/L — ABNORMAL LOW (ref 101–111)
Creatinine, Ser: 0.86 mg/dL (ref 0.44–1.00)
GFR calc non Af Amer: >60 mL/min (ref >60)
GLUCOSE: 94 mg/dL (ref 65–99)
POTASSIUM: 4.6 mmol/L (ref 3.5–5.1)
SODIUM: 136 mmol/L (ref 135–145)
TOTAL PROTEIN: 6.7 g/dL (ref 6.5–8.1)
Total Bilirubin: 1.1 mg/dL (ref 0.3–1.2)

## 2016-09-30 LAB — PHOSPHORUS: PHOSPHORUS: 4.6 mg/dL (ref 2.5–4.6)

## 2016-09-30 LAB — MAGNESIUM: Magnesium: 2.3 mg/dL (ref 1.7–2.4)

## 2016-09-30 MED ORDER — TRACE MINERALS CR-CU-MN-SE-ZN 10-1000-500-60 MCG/ML IV SOLN
INTRAVENOUS | Status: AC
  Administered 2016-09-30: 17:00:00 via INTRAVENOUS
  Filled 2016-09-30: qty 1990

## 2016-09-30 NOTE — Progress Notes (Signed)
Occupational Therapy Session Note  Patient Details  Name: Barbara Matthews MRN: [id number] Date of Birth: 01/03/1956  Today's Date: 09/30/2016 OT Individual Time: 5625-6389 OT Individual Time Calculation (min): 53 min    Short Term Goals: Week 2:  OT Short Term Goal 1 (Week 2): STG=LTG d/t discharge date  Skilled Therapeutic Interventions/Progress Updates:    Upon entering the room, pt seated in wheelchair awaiting therapist asking to wash and change hospital gown. Pt performed stand pivot transfer from wheelchair with steady assistance and use of RW. Pt maintaining NWB on L LE. Pt utilized leg lifter for sit >supine. OT educated and demonstrating how to unfasten L knee brace. Pt returned demonstration with min verbal cues. Pt able to wash L LE without blending or moving L LE. Pt refastened with min verbal cues and donned hospital gown after bathing UB. Lateral leans in hospital bed to wash buttocks. Set up A for materials and clothing items. Call bell and all needed items within reach upon exiting the room.   Therapy Documentation Precautions:  Precautions Precautions: Fall, Knee Precaution Comments: NWB LLE Required Braces or Orthoses: Other Brace/Splint Other Brace/Splint: L knee brace for OOB; unrestricted ROM Restrictions Weight Bearing Restrictions: Yes LLE Weight Bearing: Non weight bearing General:   Vital Signs: Therapy Vitals Temp: 98.4 F (36.9 C) Temp Source: Oral Pulse Rate: 99 Resp: 18 BP: 116/63 Patient Position (if appropriate): Sitting Oxygen Therapy SpO2: 100 % O2 Device: Not Delivered Pain:   ADL: ADL ADL Comments: Please see functional navigator for ADL status Exercises:   Other Treatments:    See Function Navigator for Current Functional Status.   Therapy/Group: Individual Therapy  Gypsy Decant 09/30/2016, 4:33 PM
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
## 2016-09-30 NOTE — Progress Notes (Signed)
Occupational Therapy Session Note  Patient Details  Name: Barbara Matthews MRN: [id number] Date of Birth: 01/29/1956  Today's Date: 09/30/2016 OT Individual Time: 1300-1415 OT Individual Time Calculation (min): 75 min    Short Term Goals: Week 2:  OT Short Term Goal 1 (Week 2): STG=LTG d/t discharge date  Skilled Therapeutic Interventions/Progress Updates: 1:1 Pt in bed when arrived. Focus on practicing donning and doffing left LE brace, donning and doffing sock and LB clothing with AE. Pt able to don pants with setup. Pt ambulated from doorway of bathroom to toilet with RW with steadying A to close supervision; also perform toileting with supervision. Discussed / problem solving how she was going to empty ostomy bag and discussed with SW and RN. Pt continued to perform transfers with RW with min  cuing for w/c setup ( to lock brakes). Also focus on knee ROM at EOM to increase ability to reach LEs. Returned to room and left in w/c to rest.      Therapy Documentation Precautions:  Precautions Precautions: Fall, Knee Precaution Comments: NWB LLE Required Braces or Orthoses: Other Brace/Splint Other Brace/Splint: L knee brace for OOB; unrestricted ROM Restrictions Weight Bearing Restrictions: Yes LLE Weight Bearing: Non weight bearing Pain:  no c/o pain Other Treatments:    See Function Navigator for Current Functional Status.   Therapy/Group: Individual Therapy  Willeen Cass [facility] 09/30/2016, 4:14 PM
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
## 2016-10-01 ENCOUNTER — Encounter (HOSPITAL_COMMUNITY): Payer: BC Managed Care – PPO | Admitting: Occupational Therapy

## 2016-10-01 ENCOUNTER — Inpatient Hospital Stay (HOSPITAL_COMMUNITY): Payer: BC Managed Care – PPO

## 2016-10-01 ENCOUNTER — Inpatient Hospital Stay (HOSPITAL_COMMUNITY): Payer: BC Managed Care – PPO | Admitting: Occupational Therapy

## 2016-10-01 LAB — GLUCOSE, CAPILLARY
Glucose-Capillary: 101 mg/dL — ABNORMAL HIGH (ref 65–99)
Glucose-Capillary: 107 mg/dL — ABNORMAL HIGH (ref 65–99)
Glucose-Capillary: 156 mg/dL — ABNORMAL HIGH (ref 65–99)
Glucose-Capillary: 175 mg/dL — ABNORMAL HIGH (ref 65–99)
Glucose-Capillary: 73 mg/dL (ref 65–99)
Glucose-Capillary: 77 mg/dL (ref 65–99)
Glucose-Capillary: 86 mg/dL (ref 65–99)
Glucose-Capillary: 91 mg/dL (ref 65–99)

## 2016-10-01 MED ORDER — OXYCODONE HCL 5 MG PO TABS: 5.0000 mg | ORAL_TABLET | ORAL | 0 refills | Status: DC | PRN

## 2016-10-01 MED ORDER — SODIUM BICARBONATE 650 MG PO TABS: 650.0000 mg | ORAL_TABLET | Freq: Three times a day (TID) | ORAL | 1 refills | Status: DC

## 2016-10-01 MED ORDER — TRAMADOL HCL 50 MG PO TABS: 50.0000 mg | ORAL_TABLET | Freq: Four times a day (QID) | ORAL | 0 refills | Status: DC

## 2016-10-01 MED ORDER — FAT EMULSION 20 % IV EMUL
240.0000 mL | INTRAVENOUS | Status: DC
  Administered 2016-10-01: 240 mL via INTRAVENOUS
  Filled 2016-10-01: qty 250

## 2016-10-01 MED ORDER — PANTOPRAZOLE SODIUM 40 MG PO TBEC: 40.0000 mg | DELAYED_RELEASE_TABLET | Freq: Every day | ORAL | 1 refills | Status: DC

## 2016-10-01 MED ORDER — GLIPIZIDE 10 MG PO TABS: 20.0000 mg | ORAL_TABLET | Freq: Every day | ORAL | 1 refills | Status: DC

## 2016-10-01 MED ORDER — AMLODIPINE BESYLATE 10 MG PO TABS: 10.0000 mg | ORAL_TABLET | Freq: Every day | ORAL | 1 refills | Status: DC

## 2016-10-01 MED ORDER — METOPROLOL TARTRATE 37.5 MG PO TABS: 37.5000 mg | ORAL_TABLET | Freq: Two times a day (BID) | ORAL | 1 refills | Status: DC

## 2016-10-01 MED ORDER — M.V.I. ADULT IV INJ
INTRAVENOUS | Status: DC
  Administered 2016-10-01: 18:00:00 via INTRAVENOUS
  Filled 2016-10-01: qty 1990

## 2016-10-01 NOTE — Progress Notes (Signed)
Occupational Therapy Discharge Summary  Patient Details  Name: Barbara Matthews MRN: [id number] Date of Birth: 1956/04/22  Today's Date: 10/01/2016 OT Individual Time: 1330-1430 OT Individual Time Calculation (min): 60 min   GRAD DAY: Family education with pt and pt's daughter on how to bump pt up the stairs with two caregivers, sit to stands, toileting, bathing and dressing, donning and doffing Bledsoe brace, knee ROM, safety, DME and management of w/c.   Patient has met 8 of 8 long term goals due to improved activity tolerance, improved balance, postural control, ability to compensate for deficits and improved coordination.  Patient to discharge at overall supervision to min A level.  Patient's care partner is independent to provide the necessary physical and cognitive assistance at discharge.    Reasons goals not met: n/a  Recommendation:  Patient will benefit from ongoing skilled OT services in home health setting to continue to advance functional skills in the area of BADL and Reduce care partner burden.  Equipment: w/c already has a BSC  Reasons for discharge: treatment goals met and discharge from hospital  Patient/family agrees with progress made and goals achieved: Yes  OT Discharge Precautions/Restrictions  Precautions Precautions: Fall Precaution Booklet Issued: No Precaution Comments: NWB LLE Required Braces or Orthoses: Other Brace/Splint Other Brace/Splint: L knee brace for OOB; unrestricted ROM Restrictions Weight Bearing Restrictions: Yes LLE Weight Bearing: Non weight bearing   Vital Signs Therapy Vitals Temp: 98.3 F (36.8 C) Temp Source: Oral Pulse Rate: 81 Resp: 17 BP: (!) 101/47 Patient Position (if appropriate): Sitting Oxygen Therapy SpO2: 100 % O2 Device: Not Delivered Pain  no c/o pain ADL ADL ADL Comments: see functional navigator Vision/Perception  Vision- History Baseline Vision/History: No visual deficits Patient Visual Report: No  change from baseline Vision- Assessment Vision Assessment?: No apparent visual deficits  Cognition Overall Cognitive Status: History of cognitive impairments - at baseline Arousal/Alertness: Awake/alert Orientation Level: Oriented X4 Attention: Sustained Sustained Attention: Impaired Memory: Impaired Memory Impairment: Decreased recall of new information Awareness: Appears intact Problem Solving: Impaired Safety/Judgment: Impaired Sensation Sensation Light Touch: Appears Intact Hot/Cold: Appears Intact Proprioception: Appears Intact Coordination Gross Motor Movements are Fluid and Coordinated: No Fine Motor Movements are Fluid and Coordinated: No Coordination and Movement Description: Affected by pain and LE edema during BADLs Motor  Motor Motor: Other (comment) Motor - Discharge Observations: post op pain and weaknees in LLE; WB restrictions and use of brace Mobility  Transfers Transfers: Sit to Stand;Stand to Sit Sit to Stand: 5: Supervision Stand to Sit: 5: Supervision  Trunk/Postural Assessment  Cervical Assessment Cervical Assessment: Within Functional Limits Thoracic Assessment Thoracic Assessment: Within Functional Limits Lumbar Assessment Lumbar Assessment: Within Functional Limits Postural Control Postural Control: Within Functional Limits  Balance Balance Balance Assessed: Yes Static Sitting Balance Static Sitting - Level of Assistance: 6: Modified independent (Device/Increase time) Dynamic Sitting Balance Dynamic Sitting - Balance Support: During functional activity Dynamic Sitting - Level of Assistance: 6: Modified independent (Device/Increase time) Static Standing Balance Static Standing - Level of Assistance: 5: Stand by assistance;6: Modified independent (Device/Increase time) Dynamic Standing Balance Dynamic Standing - Level of Assistance: 5: Stand by assistance Extremity/Trunk Assessment RUE Assessment RUE Assessment: Within Functional  Limits LUE Assessment LUE Assessment: Within Functional Limits   See Function Navigator for Current Functional Status.  Willeen Cass [facility] 10/01/2016, 4:05 PM

## 2016-10-01 NOTE — Discharge Instructions (Signed)
Inpatient Rehab Discharge Instructions  Barbara Matthews Discharge date and time: No discharge date for patient encounter.   Activities/Precautions/ Functional Status: Activity: Nonweightbearing left lower extremity with hinged knee brace Diet: Continue TPN as prior to admission/regular diet Wound Care: Routine colostomy care Functional status:  ___ No restrictions     ___ Walk up steps independently ___ 24/7 supervision/assistance   ___ Walk up steps with assistance ___ Intermittent supervision/assistance  ___ Bathe/dress independently ___ Walk with walker     _x__ Bathe/dress with assistance ___ Walk Independently    ___ Shower independently ___ Walk with assistance    ___ Shower with assistance ___ No alcohol     ___ Return to work/school ________   COMMUNITY REFERRALS UPON DISCHARGE:    Home Health:   PT     OT      RN                      Agency:  [facility]    Phone:  [phone]   Medical Equipment/Items Ordered:  Wheelchair, cushion                                                      Agency/Supplier:  [facility] @ [phone]     Special Instructions:    My questions have been answered and I understand these instructions. I will adhere to these goals and the provided educational materials after my discharge from the hospital.  Patient/Caregiver Signature _______________________________ Date __________  Clinician Signature _______________________________________ Date __________  Please bring this form and your medication list with you to all your follow-up doctor's appointments.

## 2016-10-01 NOTE — Discharge Summary (Signed)
Discharge summary job [id number]
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
## 2016-10-01 NOTE — Progress Notes (Signed)
Occupational Therapy Session Note  Patient Details  Name: Barbara Matthews MRN: [id number] Date of Birth: [birth year]  Today's Date: 10/01/2016 OT Individual Time: 1004-1050 OT Individual Time Calculation (min): 46 min    Skilled Therapeutic Interventions/Progress Updates: Pt was sitting in w/c at time of arrival, requesting to sponge bathe. Tx focus on adaptive bathing/dressing skills, d/c planning, and functional transfers. Pt completed bathing with overall Min A for L LE. She was able to doff brace for washing. Per pt, she plans to use RW in bathroom and sit on chair at sink. Education provided on always having daughter present to assist as needed. She completed pericare and clothing mgt via lateral leaning. When she was lifting pants over hips, she reported that she was urinating. Pt reported that it was "just a drip" and was going to hold it until she made it to the toilet. Pt ambulated into bathroom over small threshold with RW and Min A to complete toileting. She required overall steady assist for clothing mgt in standing. Afterwards pt was left in w/c with all needs within reach and RN present.       Therapy Documentation Precautions:  Precautions Precautions: Fall, Knee Precaution Comments: NWB LLE Required Braces or Orthoses: Other Brace/Splint Other Brace/Splint: L knee brace for OOB; unrestricted ROM Restrictions Weight Bearing Restrictions: Yes LLE Weight Bearing: Non weight bearing  Pain: No c/o pain during session    ADL: ADL ADL Comments: Please see functional navigator for ADL status:    See Function Navigator for Current Functional Status.   Therapy/Group: Individual Therapy  [name] [name] 10/01/2016, 12:32 PM

## 2016-10-01 NOTE — Progress Notes (Signed)
Physical Therapy Discharge Summary  Patient Details  Name: Barbara Matthews MRN: [id number] Date of Birth: 1956/01/31   Patient has met 8 of 8 long term goals due to improved activity tolerance, improved balance, improved postural control, increased strength, increased range of motion, decreased pain, ability to compensate for deficits and functional use of  left lower extremity.  Patient to discharge at mod I to supervision level using w/c and RW for shorter distance ambulation. Pt will require total assist +2 to bump up w/c for access to home. Pt is unable to safely maintain NWB status and complete a step (and pt has 10 STE home).  Patient's care partner did not come in for PT family education. Handouts provided to patient and demonstrated bumping up w/c technique. OT was able to address this in later session successfully. Pt is otherwise at supervision to modified independent level for household mobility and basic transfers.   Reasons goals not met: n/a - all goals met at this time.  Recommendation:  Patient will benefit from ongoing skilled PT services in home health setting to continue to advance safe functional mobility, address ongoing impairments in strength, ROM, edema, balance, endurance, gait, activity tolerance, and minimize fall risk.  Equipment: 18x18 w/c with basic cushion and elevating legrests. Pt already owns RW.  Reasons for discharge: treatment goals met and discharge from hospital  Patient/family agrees with progress made and goals achieved: Yes  PT Discharge Precautions/Restrictions Precautions Precautions: Fall Required Braces or Orthoses: Other Brace/Splint Other Brace/Splint: L knee brace for OOB; unrestricted ROM Restrictions Weight Bearing Restrictions: Yes LLE Weight Bearing: Non weight bearing    Cognition Memory: Impaired Memory Impairment: Decreased recall of new information Sensation Sensation Light Touch: Appears Intact Coordination Gross Motor  Movements are Fluid and Coordinated: No (LLE limited due to brace and weakness) Motor  Motor Motor: Other (comment) Motor - Discharge Observations: post op pain and weaknees in LLE; WB restrictions and use of brace     Trunk/Postural Assessment  Cervical Assessment Cervical Assessment: Within Functional Limits Thoracic Assessment Thoracic Assessment: Within Functional Limits Lumbar Assessment Lumbar Assessment: Within Functional Limits Postural Control Postural Control: Within Functional Limits  Balance Balance Balance Assessed: Yes Static Sitting Balance Static Sitting - Level of Assistance: 6: Modified independent (Device/Increase time) Dynamic Sitting Balance Dynamic Sitting - Level of Assistance: 6: Modified independent (Device/Increase time) Static Standing Balance Static Standing - Level of Assistance: 5: Stand by assistance;6: Modified independent (Device/Increase time) Dynamic Standing Balance Dynamic Standing - Level of Assistance: 5: Stand by assistance Extremity Assessment      RLE Assessment RLE Assessment: Within Functional Limits LLE Assessment LLE Assessment: Exceptions to [facility] LLE Strength LLE Overall Strength Comments: grossly 3-/5; decreased strength and endurance overall. Limited knee ROM. BRACE for OOB   See Function Navigator for Current Functional Status.  Canary Brim [facility] 10/01/2016, 2:50 PM

## 2016-10-01 NOTE — Progress Notes (Addendum)
Social Work Patient ID: Barbara Matthews, female   DOB: Sep 04, 1955, 62 y.o.   MRN: [id number]   Spoke with pt's daughter just prior to team conference on Wednesday and she reports that "my mom wants to come home so we're just going to go back to her apartment."  Stressed to her that the issue with the apartment remains that there is a flight of steps to manage.  She reports that her "uncle is going to help me."  Then explained to her that I would alert tx team to her plan in team conference and that daughter and uncle MUST come in for education to determine if this plan will work and to review other educational points.  She reports that she would make arrangements to be here today.  She was also asked to contact me by noon on Thursday to let me know WHEN they would be here.  I have left several messages yesterday and none have been returned.  Instructed in my messages the times that pt has therapy.  Will continue to monitor that all education being completed today.  Pt aware of all of this that has taken place with daughter.  She does not really have much concern about it.    Barbara Heenan, LCSW    10:09am - went to pt's room and she was on the phone with her daughter.  Daughter reports she will be here for the 1:30 tx session today. Explained to her that she MUST have a 2nd person to go through stair education - pt calling her brother to tell him to be here by 1:30.

## 2016-10-01 NOTE — Progress Notes (Signed)
Physical Therapy Session Note  Patient Details  Name: Barbara Matthews MRN: [id number] Date of Birth: 04/01/1956  Today's Date: 10/01/2016  Short Term Goals: Week 2:  PT Short Term Goal 1 (Week 2): = LTGS  Session #1: PT Individual Time: 0830-0900 PT Individual Time Calculation (min): 30 min    No reports of pain. Pt reports she is unsure of if family will be able to come today for family education. Handout issued and reviewed with patient as well as physical demonstration by PT of technique for bumping her up in w/c up/down stairs for home access. Stressed the importance of 2 people to assist with this for safety as well as making sure they only hold onto non-removable pieces of the w/c. Plan to attempt a step this later session this AM. Pt completed bed mobility at modified independent level with extra time. Pt able to transfer to w/c with supervision using RW for stand step transfer- cues for management of colostomy foley bag and lines from TPN hook-up.   Session #2: Individual Time: 1105-1200 (55 minutes) No reports of pain. Family will not attend therapy until 1:30 pm per patient and CSW report. Reviewed car transfer, w/c mobility and parts management, basic transfers, bed mobility on flat bed, L knee ROM and donning/doffing brace for LLE. Pt completed transfers supervision to modified independent - requires cues still for management of tubing of colostomy as well as assist with parts management. Min assist for car transfer due to assist to bring LLE onto the chair in long sitting technique. Demonstrated technique on stairs if pt were to "hop" up with patient attempting but unable to complete even the first step. Pt declined stating she wouldn't be able to do it at home and would need to be "carried" or bumped up in the w/c. Engaged in self ROM for L knee in seated and supine positions with education on importance of continuing the HEP. Pt verbalized understanding. Pt able to don and doff brace  independently with extra time.   Therapy Documentation Precautions:  Precautions Precautions: Fall, Knee Precaution Comments: NWB LLE Required Braces or Orthoses: Other Brace/Splint Other Brace/Splint: L knee brace for OOB; unrestricted ROM Restrictions Weight Bearing Restrictions: Yes LLE Weight Bearing: Non weight bearing   See Function Navigator for Current Functional Status.   Therapy/Group: Individual Therapy  Canary Brim Ivory Broad, PT, DPT  10/01/2016, 12:04 PM
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
## 2016-10-02 LAB — BASIC METABOLIC PANEL
Anion gap: 9 (ref 5–15)
BUN: 58 mg/dL — ABNORMAL HIGH (ref 6–20)
CHLORIDE: 98 mmol/L — AB (ref 101–111)
CO2: 27 mmol/L (ref 22–32)
CREATININE: 0.97 mg/dL (ref 0.44–1.00)
Calcium: 10.1 mg/dL (ref 8.9–10.3)
GFR calc Af Amer: >60 mL/min (ref >60)
GFR calc non Af Amer: >60 mL/min (ref >60)
Glucose, Bld: 65 mg/dL (ref 65–99)
Potassium: 4.8 mmol/L (ref 3.5–5.1)
Sodium: 134 mmol/L — ABNORMAL LOW (ref 135–145)

## 2016-10-02 LAB — PHOSPHORUS: Phosphorus: 4.9 mg/dL — ABNORMAL HIGH (ref 2.5–4.6)

## 2016-10-02 LAB — GLUCOSE, CAPILLARY
GLUCOSE-CAPILLARY: 62 mg/dL — AB (ref 65–99)
GLUCOSE-CAPILLARY: 64 mg/dL — AB (ref 65–99)
GLUCOSE-CAPILLARY: 105 mg/dL — AB (ref 65–99)
Glucose-Capillary: 80 mg/dL (ref 65–99)
Glucose-Capillary: 58 mg/dL — ABNORMAL LOW (ref 65–99)
Glucose-Capillary: 68 mg/dL (ref 65–99)

## 2016-10-02 MED ORDER — HEPARIN SOD (PORK) LOCK FLUSH 100 UNIT/ML IV SOLN
500.0000 [IU] | INTRAVENOUS | Status: AC | PRN
  Administered 2016-10-02: 500 [IU]

## 2016-10-02 NOTE — Progress Notes (Signed)
Discharged to home accompanied by dtr. Discharge info given yesterday and has wheelchair. Reviewed instructions, no questions noted. Checked room for belongings and escorted out via wheelchair.Barbara Matthews
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
## 2016-10-02 NOTE — Progress Notes (Signed)
Social Work  Discharge Note  The overall goal for the admission was met for:   Discharge location: Yes - home with daughter to 2nd level apartment.  Education on steps completed with daughter yesterday.  Length of Stay: Yes  Discharge activity level: Yes - supervision to some min assist  Home/community participation: Yes  Services provided included: MD, RD, PT, OT, RN, TR, SW, Neuropsych and Pharmacy  Financial Services: Private Insurance: [employer]  Follow-up services arranged: Home Health: RN, PT, OT via [employer], DME: 18x18 lighweight w/c and cushion via [facility] and Patient/Family request agency HH: [facility], DME: no pref  Comments (or additional information): TPN restarted with [facility] who were following pt PTA  Patient/Family verbalized understanding of follow-up arrangements: Yes  Individual responsible for coordination of the follow-up plan: pt  Confirmed correct DME delivered: Sonja Manseau 10/02/2016    Aurore Redinger
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
## 2016-10-07 ENCOUNTER — Encounter
Payer: BC Managed Care – PPO | Attending: Physical Medicine & Rehabilitation | Admitting: Physical Medicine & Rehabilitation

## 2016-10-07 DIAGNOSIS — Z87891 Personal history of nicotine dependence: Secondary | ICD-10-CM | POA: Insufficient documentation

## 2016-10-07 DIAGNOSIS — S2249XD Multiple fractures of ribs, unspecified side, subsequent encounter for fracture with routine healing: Secondary | ICD-10-CM | POA: Insufficient documentation

## 2016-10-07 DIAGNOSIS — E7800 Pure hypercholesterolemia, unspecified: Secondary | ICD-10-CM | POA: Insufficient documentation

## 2016-10-07 DIAGNOSIS — S36112D Contusion of liver, subsequent encounter: Secondary | ICD-10-CM | POA: Insufficient documentation

## 2016-10-07 DIAGNOSIS — Z933 Colostomy status: Secondary | ICD-10-CM | POA: Insufficient documentation

## 2016-10-07 DIAGNOSIS — S36113D Laceration of liver, unspecified degree, subsequent encounter: Secondary | ICD-10-CM | POA: Insufficient documentation

## 2016-10-07 DIAGNOSIS — C19 Malignant neoplasm of rectosigmoid junction: Secondary | ICD-10-CM | POA: Insufficient documentation

## 2016-10-07 DIAGNOSIS — S72402D Unspecified fracture of lower end of left femur, subsequent encounter for closed fracture with routine healing: Secondary | ICD-10-CM | POA: Insufficient documentation

## 2016-10-07 DIAGNOSIS — E1142 Type 2 diabetes mellitus with diabetic polyneuropathy: Secondary | ICD-10-CM | POA: Insufficient documentation

## 2016-10-07 DIAGNOSIS — F419 Anxiety disorder, unspecified: Secondary | ICD-10-CM | POA: Insufficient documentation

## 2016-10-07 DIAGNOSIS — I10 Essential (primary) hypertension: Secondary | ICD-10-CM | POA: Insufficient documentation

## 2016-10-27 ENCOUNTER — Encounter: Payer: Self-pay | Admitting: Physical Medicine & Rehabilitation

## 2016-10-27 ENCOUNTER — Encounter (HOSPITAL_BASED_OUTPATIENT_CLINIC_OR_DEPARTMENT_OTHER): Payer: BC Managed Care – PPO | Admitting: Physical Medicine & Rehabilitation

## 2016-10-27 VITALS — BP 185/79 | HR 117 | Resp 14

## 2016-10-27 DIAGNOSIS — S72492S Other fracture of lower end of left femur, sequela: Secondary | ICD-10-CM | POA: Diagnosis not present

## 2016-10-27 DIAGNOSIS — C210 Malignant neoplasm of anus, unspecified: Secondary | ICD-10-CM | POA: Diagnosis not present

## 2016-10-27 DIAGNOSIS — Z933 Colostomy status: Secondary | ICD-10-CM | POA: Diagnosis not present

## 2016-10-27 DIAGNOSIS — S36113D Laceration of liver, unspecified degree, subsequent encounter: Secondary | ICD-10-CM | POA: Diagnosis not present

## 2016-10-27 DIAGNOSIS — E118 Type 2 diabetes mellitus with unspecified complications: Secondary | ICD-10-CM | POA: Diagnosis not present

## 2016-10-27 DIAGNOSIS — I10 Essential (primary) hypertension: Secondary | ICD-10-CM | POA: Diagnosis not present

## 2016-10-27 DIAGNOSIS — I169 Hypertensive crisis, unspecified: Secondary | ICD-10-CM

## 2016-10-27 DIAGNOSIS — E1165 Type 2 diabetes mellitus with hyperglycemia: Secondary | ICD-10-CM

## 2016-10-27 DIAGNOSIS — T07XXXA Unspecified multiple injuries, initial encounter: Secondary | ICD-10-CM

## 2016-10-27 DIAGNOSIS — Z789 Other specified health status: Secondary | ICD-10-CM | POA: Diagnosis not present

## 2016-10-27 DIAGNOSIS — S72402D Unspecified fracture of lower end of left femur, subsequent encounter for closed fracture with routine healing: Secondary | ICD-10-CM | POA: Diagnosis not present

## 2016-10-27 DIAGNOSIS — R269 Unspecified abnormalities of gait and mobility: Secondary | ICD-10-CM | POA: Diagnosis not present

## 2016-10-27 DIAGNOSIS — R000 Tachycardia, unspecified: Secondary | ICD-10-CM

## 2016-10-27 DIAGNOSIS — E1142 Type 2 diabetes mellitus with diabetic polyneuropathy: Secondary | ICD-10-CM

## 2016-10-27 DIAGNOSIS — F419 Anxiety disorder, unspecified: Secondary | ICD-10-CM | POA: Diagnosis not present

## 2016-10-27 DIAGNOSIS — E7800 Pure hypercholesterolemia, unspecified: Secondary | ICD-10-CM | POA: Diagnosis not present

## 2016-10-27 DIAGNOSIS — IMO0002 Reserved for concepts with insufficient information to code with codable children: Secondary | ICD-10-CM

## 2016-10-27 DIAGNOSIS — S36112D Contusion of liver, subsequent encounter: Secondary | ICD-10-CM | POA: Diagnosis not present

## 2016-10-27 DIAGNOSIS — S2249XD Multiple fractures of ribs, unspecified side, subsequent encounter for fracture with routine healing: Secondary | ICD-10-CM | POA: Diagnosis not present

## 2016-10-27 DIAGNOSIS — C19 Malignant neoplasm of rectosigmoid junction: Secondary | ICD-10-CM | POA: Diagnosis not present

## 2016-10-27 DIAGNOSIS — E119 Type 2 diabetes mellitus without complications: Secondary | ICD-10-CM | POA: Diagnosis present

## 2016-10-27 DIAGNOSIS — Z87891 Personal history of nicotine dependence: Secondary | ICD-10-CM | POA: Diagnosis not present
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
## 2016-12-24 ENCOUNTER — Ambulatory Visit: Payer: BC Managed Care – PPO | Admitting: Physical Medicine & Rehabilitation

## 2016-12-24 ENCOUNTER — Encounter: Payer: BC Managed Care – PPO | Admitting: Physical Medicine & Rehabilitation

## 2017-01-04 ENCOUNTER — Other Ambulatory Visit
Admission: RE | Admit: 2017-01-04 | Discharge: 2017-01-04 | Disposition: A | Discharge status: Home / Self Care | Payer: BC Managed Care – PPO | Source: Other Acute Inpatient Hospital | Attending: Internal Medicine | Admitting: Internal Medicine

## 2017-01-04 DIAGNOSIS — C211 Malignant neoplasm of anal canal: Secondary | ICD-10-CM | POA: Diagnosis not present

## 2017-01-04 LAB — CBC WITH DIFFERENTIAL/PLATELET
Basophils Absolute: 0.1 10*3/uL (ref 0–0.1)
Basophils Relative: 1 %
EOS ABS: 0.3 10*3/uL (ref 0–0.7)
Eosinophils Relative: 3 %
HEMATOCRIT: 39.4 % (ref 35.0–47.0)
HEMOGLOBIN: 12.9 g/dL (ref 12.0–16.0)
LYMPHS ABS: 1.6 10*3/uL (ref 1.0–3.6)
LYMPHS PCT: 18 %
MCH: 25.3 pg — AB (ref 26.0–34.0)
MCHC: 32.7 g/dL (ref 32.0–36.0)
MCV: 77.3 fL — AB (ref 80.0–100.0)
MONOS PCT: 7 %
Monocytes Absolute: 0.7 10*3/uL (ref 0.2–0.9)
NEUTROS ABS: 6.5 10*3/uL (ref 1.4–6.5)
NEUTROS PCT: 71 %
Platelets: 222 10*3/uL (ref 150–440)
RBC: 5.1 MIL/uL (ref 3.80–5.20)
RDW: 17.5 % — ABNORMAL HIGH (ref 11.5–14.5)
WBC: 9.2 10*3/uL (ref 3.6–11.0)

## 2017-01-04 LAB — MAGNESIUM: Magnesium: 1.8 mg/dL (ref 1.7–2.4)

## 2017-01-04 LAB — COMPREHENSIVE METABOLIC PANEL
ALT: 32 U/L (ref 14–54)
AST: 26 U/L (ref 15–41)
Albumin: 3 g/dL — ABNORMAL LOW (ref 3.5–5.0)
Alkaline Phosphatase: 154 U/L — ABNORMAL HIGH (ref 38–126)
Anion gap: 6 (ref 5–15)
BUN: 24 mg/dL — ABNORMAL HIGH (ref 6–20)
CO2: 29 mmol/L (ref 22–32)
Calcium: 9.6 mg/dL (ref 8.9–10.3)
Chloride: 104 mmol/L (ref 101–111)
Creatinine, Ser: 0.72 mg/dL (ref 0.44–1.00)
GFR calc non Af Amer: >60 mL/min (ref >60)
Glucose, Bld: 113 mg/dL — ABNORMAL HIGH (ref 65–99)
POTASSIUM: 4.1 mmol/L (ref 3.5–5.1)
SODIUM: 139 mmol/L (ref 135–145)
Total Bilirubin: 1.1 mg/dL (ref 0.3–1.2)
Total Protein: 7.5 g/dL (ref 6.5–8.1)

## 2017-01-04 LAB — PHOSPHORUS: PHOSPHORUS: 3.1 mg/dL (ref 2.5–4.6)

## 2017-01-04 LAB — CHOLESTEROL, TOTAL: Cholesterol: 182 mg/dL (ref 0–200)

## 2017-01-04 LAB — LACTATE DEHYDROGENASE: LDH: 215 U/L — ABNORMAL HIGH (ref 98–192)

## 2017-01-04 LAB — TRIGLYCERIDES: TRIGLYCERIDES: 126 mg/dL (ref <150)

## 2017-01-21 HISTORY — PX: OTHER SURGICAL HISTORY: SHX169

## 2017-02-15 DIAGNOSIS — N189 Chronic kidney disease, unspecified: Secondary | ICD-10-CM

## 2017-02-15 HISTORY — DX: Chronic kidney disease, unspecified: N18.9

## 2017-03-28 ENCOUNTER — Encounter (HOSPITAL_COMMUNITY): Payer: Self-pay | Admitting: *Deleted

## 2017-03-28 NOTE — Progress Notes (Signed)
Barbara Matthews asked me to speak to her daughter [name], they were at a Dr appointment.  Per [facility] does not have a CBG machine.  Patient had a CBC and CMP done today.  I ask ed anesthesia PA or NP to review chart and labs.

## 2017-03-29 ENCOUNTER — Inpatient Hospital Stay (HOSPITAL_COMMUNITY): Payer: BC Managed Care – PPO | Admitting: Certified Registered"

## 2017-03-29 ENCOUNTER — Inpatient Hospital Stay (HOSPITAL_COMMUNITY): Payer: BC Managed Care – PPO

## 2017-03-29 ENCOUNTER — Encounter (HOSPITAL_COMMUNITY): Payer: Self-pay

## 2017-03-29 ENCOUNTER — Encounter (HOSPITAL_COMMUNITY)
Admission: RE | Disposition: A | Discharge status: Home / Self Care | Payer: Self-pay | Source: Ambulatory Visit | Attending: Orthopedic Surgery

## 2017-03-29 ENCOUNTER — Inpatient Hospital Stay (HOSPITAL_COMMUNITY)
Admission: RE | Admit: 2017-03-29 | Discharge: 2017-03-31 | DRG: 488 | Disposition: A | Discharge status: Home / Self Care | Payer: BC Managed Care – PPO | Source: Ambulatory Visit | Attending: Orthopedic Surgery | Admitting: Orthopedic Surgery

## 2017-03-29 DIAGNOSIS — I10 Essential (primary) hypertension: Secondary | ICD-10-CM | POA: Diagnosis present

## 2017-03-29 DIAGNOSIS — E7800 Pure hypercholesterolemia, unspecified: Secondary | ICD-10-CM | POA: Diagnosis present

## 2017-03-29 DIAGNOSIS — M24662 Ankylosis, left knee: Secondary | ICD-10-CM | POA: Diagnosis present

## 2017-03-29 DIAGNOSIS — Z85118 Personal history of other malignant neoplasm of bronchus and lung: Secondary | ICD-10-CM | POA: Diagnosis not present

## 2017-03-29 DIAGNOSIS — F419 Anxiety disorder, unspecified: Secondary | ICD-10-CM | POA: Diagnosis present

## 2017-03-29 DIAGNOSIS — Z87891 Personal history of nicotine dependence: Secondary | ICD-10-CM

## 2017-03-29 DIAGNOSIS — Z79899 Other long term (current) drug therapy: Secondary | ICD-10-CM | POA: Diagnosis not present

## 2017-03-29 DIAGNOSIS — Z7984 Long term (current) use of oral hypoglycemic drugs: Secondary | ICD-10-CM

## 2017-03-29 DIAGNOSIS — E1165 Type 2 diabetes mellitus with hyperglycemia: Secondary | ICD-10-CM

## 2017-03-29 DIAGNOSIS — E119 Type 2 diabetes mellitus without complications: Secondary | ICD-10-CM | POA: Diagnosis present

## 2017-03-29 DIAGNOSIS — C20 Malignant neoplasm of rectum: Secondary | ICD-10-CM | POA: Diagnosis present

## 2017-03-29 DIAGNOSIS — D638 Anemia in other chronic diseases classified elsewhere: Secondary | ICD-10-CM | POA: Diagnosis present

## 2017-03-29 DIAGNOSIS — M24562 Contracture, left knee: Secondary | ICD-10-CM | POA: Diagnosis present

## 2017-03-29 DIAGNOSIS — Z95828 Presence of other vascular implants and grafts: Secondary | ICD-10-CM

## 2017-03-29 DIAGNOSIS — M25562 Pain in left knee: Secondary | ICD-10-CM | POA: Diagnosis present

## 2017-03-29 DIAGNOSIS — Z433 Encounter for attention to colostomy: Secondary | ICD-10-CM

## 2017-03-29 DIAGNOSIS — Z419 Encounter for procedure for purposes other than remedying health state, unspecified: Secondary | ICD-10-CM

## 2017-03-29 DIAGNOSIS — E118 Type 2 diabetes mellitus with unspecified complications: Secondary | ICD-10-CM

## 2017-03-29 DIAGNOSIS — IMO0002 Reserved for concepts with insufficient information to code with codable children: Secondary | ICD-10-CM

## 2017-03-29 HISTORY — DX: Pneumonia, unspecified organism: J18.9

## 2017-03-29 HISTORY — PX: KNEE CLOSED REDUCTION: SHX995

## 2017-03-29 HISTORY — DX: Unspecified osteoarthritis, unspecified site: M19.90

## 2017-03-29 HISTORY — PX: LYSIS OF ADHESION: SHX5961

## 2017-03-29 HISTORY — DX: Chronic kidney disease, unspecified: N18.9

## 2017-03-29 HISTORY — DX: Anemia, unspecified: D64.9

## 2017-03-29 HISTORY — DX: Disorder of brain, unspecified: G93.9

## 2017-03-29 HISTORY — DX: Unspecified convulsions: R56.9

## 2017-03-29 HISTORY — DX: Ankylosis, left knee: M24.662

## 2017-03-29 HISTORY — DX: Presence of other vascular implants and grafts: Z95.828

## 2017-03-29 HISTORY — DX: Unspecified intestinal obstruction, unspecified as to partial versus complete obstruction: K56.609

## 2017-03-29 LAB — GLUCOSE, CAPILLARY
GLUCOSE-CAPILLARY: 136 mg/dL — AB (ref 65–99)
Glucose-Capillary: 138 mg/dL — ABNORMAL HIGH (ref 65–99)
Glucose-Capillary: 153 mg/dL — ABNORMAL HIGH (ref 65–99)

## 2017-03-29 LAB — CBC WITH DIFFERENTIAL/PLATELET
Basophils Absolute: 0 10*3/uL (ref 0.0–0.1)
Basophils Relative: 0 %
EOS ABS: 0.1 10*3/uL (ref 0.0–0.7)
Eosinophils Relative: 1 %
HCT: 33.6 % — ABNORMAL LOW (ref 36.0–46.0)
Hemoglobin: 10.7 g/dL — ABNORMAL LOW (ref 12.0–15.0)
LYMPHS ABS: 1.6 10*3/uL (ref 0.7–4.0)
Lymphocytes Relative: 17 %
MCH: 25.5 pg — AB (ref 26.0–34.0)
MCHC: 31.8 g/dL (ref 30.0–36.0)
MCV: 80 fL (ref 78.0–100.0)
MONO ABS: 0.5 10*3/uL (ref 0.1–1.0)
MONOS PCT: 6 %
Neutro Abs: 7 10*3/uL (ref 1.7–7.7)
Neutrophils Relative %: 76 %
Platelets: 288 10*3/uL (ref 150–400)
RBC: 4.2 MIL/uL (ref 3.87–5.11)
RDW: 17.5 % — AB (ref 11.5–15.5)
WBC: 9.2 10*3/uL (ref 4.0–10.5)

## 2017-03-29 LAB — COMPREHENSIVE METABOLIC PANEL
ALK PHOS: 165 U/L — AB (ref 38–126)
ALT: 58 U/L — AB (ref 14–54)
AST: 40 U/L (ref 15–41)
Albumin: 3.1 g/dL — ABNORMAL LOW (ref 3.5–5.0)
Anion gap: 9 (ref 5–15)
BILIRUBIN TOTAL: 0.7 mg/dL (ref 0.3–1.2)
BUN: 15 mg/dL (ref 6–20)
CALCIUM: 9.5 mg/dL (ref 8.9–10.3)
CO2: 24 mmol/L (ref 22–32)
CREATININE: 0.77 mg/dL (ref 0.44–1.00)
Chloride: 108 mmol/L (ref 101–111)
GFR calc Af Amer: >60 mL/min (ref >60)
GLUCOSE: 133 mg/dL — AB (ref 65–99)
Potassium: 3.4 mmol/L — ABNORMAL LOW (ref 3.5–5.1)
Sodium: 141 mmol/L (ref 135–145)
TOTAL PROTEIN: 6.5 g/dL (ref 6.5–8.1)

## 2017-03-29 LAB — PROTIME-INR
INR: 1.03
PROTHROMBIN TIME: 13.5 s (ref 11.4–15.2)

## 2017-03-29 LAB — APTT: aPTT: 28 seconds (ref 24–36)

## 2017-03-29 LAB — HEMOGLOBIN A1C
Hgb A1c MFr Bld: 6.5 % — ABNORMAL HIGH (ref 4.8–5.6)
Mean Plasma Glucose: 139.85 mg/dL

## 2017-03-29 SURGERY — LAPAROTOMY, FOR LYSIS OF ADHESIONS
Anesthesia: General | Site: Knee

## 2017-03-29 MED ORDER — ENOXAPARIN SODIUM 30 MG/0.3ML ~~LOC~~ SOLN
30.0000 mg | Freq: Two times a day (BID) | SUBCUTANEOUS | Status: DC
  Administered 2017-03-30 – 2017-03-31 (×3): 30 mg via SUBCUTANEOUS
  Filled 2017-03-29 (×3): qty 0.3

## 2017-03-29 MED ORDER — ONDANSETRON HCL 4 MG/2ML IJ SOLN: 4.0000 mg | Freq: Four times a day (QID) | INTRAMUSCULAR | Status: DC | PRN

## 2017-03-29 MED ORDER — POTASSIUM CHLORIDE IN NACL 20-0.9 MEQ/L-% IV SOLN
INTRAVENOUS | Status: DC
  Administered 2017-03-29: 23:00:00 via INTRAVENOUS
  Filled 2017-03-29: qty 1000

## 2017-03-29 MED ORDER — METOPROLOL TARTRATE 5 MG/5ML IV SOLN
INTRAVENOUS | Status: DC | PRN
  Administered 2017-03-29: 2 mg via INTRAVENOUS
  Administered 2017-03-29 (×3): 1 mg via INTRAVENOUS

## 2017-03-29 MED ORDER — POTASSIUM CHLORIDE 2 MEQ/ML IV SOLN: INTRAVENOUS | Status: DC

## 2017-03-29 MED ORDER — PROPOFOL 10 MG/ML IV BOLUS
INTRAVENOUS | Status: DC | PRN
  Administered 2017-03-29: 110 mg via INTRAVENOUS

## 2017-03-29 MED ORDER — MIDAZOLAM HCL 2 MG/2ML IJ SOLN
INTRAMUSCULAR | Status: AC
  Administered 2017-03-29: 2 mg via INTRAVENOUS
  Filled 2017-03-29: qty 2

## 2017-03-29 MED ORDER — PHENYLEPHRINE 40 MCG/ML (10ML) SYRINGE FOR IV PUSH (FOR BLOOD PRESSURE SUPPORT)
PREFILLED_SYRINGE | INTRAVENOUS | Status: DC | PRN
  Administered 2017-03-29 (×2): 80 ug via INTRAVENOUS

## 2017-03-29 MED ORDER — METHOCARBAMOL 1000 MG/10ML IJ SOLN
500.0000 mg | Freq: Four times a day (QID) | INTRAVENOUS | Status: DC | PRN
  Filled 2017-03-29: qty 5

## 2017-03-29 MED ORDER — VITAMIN C 500 MG PO TABS
250.0000 mg | ORAL_TABLET | Freq: Two times a day (BID) | ORAL | Status: DC
  Administered 2017-03-29 – 2017-03-31 (×4): 250 mg via ORAL
  Filled 2017-03-29 (×4): qty 1

## 2017-03-29 MED ORDER — MENTHOL 3 MG MT LOZG: 1.0000 | LOZENGE | OROMUCOSAL | Status: DC | PRN

## 2017-03-29 MED ORDER — ONDANSETRON HCL 4 MG/2ML IJ SOLN
INTRAMUSCULAR | Status: DC | PRN
  Administered 2017-03-29: 4 mg via INTRAVENOUS

## 2017-03-29 MED ORDER — LIDOCAINE 2% (20 MG/ML) 5 ML SYRINGE
INTRAMUSCULAR | Status: AC
  Filled 2017-03-29: qty 5

## 2017-03-29 MED ORDER — PROPOFOL 10 MG/ML IV BOLUS
INTRAVENOUS | Status: AC
  Filled 2017-03-29: qty 20

## 2017-03-29 MED ORDER — FENTANYL CITRATE (PF) 100 MCG/2ML IJ SOLN
INTRAMUSCULAR | Status: AC
  Administered 2017-03-29: 100 ug via INTRAVENOUS
  Filled 2017-03-29: qty 2

## 2017-03-29 MED ORDER — ROCURONIUM BROMIDE 10 MG/ML (PF) SYRINGE
PREFILLED_SYRINGE | INTRAVENOUS | Status: AC
  Filled 2017-03-29: qty 5

## 2017-03-29 MED ORDER — MIDAZOLAM HCL 2 MG/2ML IJ SOLN
INTRAMUSCULAR | Status: AC
  Filled 2017-03-29: qty 2

## 2017-03-29 MED ORDER — HYDROCODONE-ACETAMINOPHEN 7.5-325 MG PO TABS
1.0000 | ORAL_TABLET | ORAL | Status: DC | PRN
  Administered 2017-03-30 (×2): 1 via ORAL
  Administered 2017-03-31: 2 via ORAL
  Filled 2017-03-29 (×2): qty 1
  Filled 2017-03-29: qty 2

## 2017-03-29 MED ORDER — FENTANYL CITRATE (PF) 250 MCG/5ML IJ SOLN
INTRAMUSCULAR | Status: AC
  Filled 2017-03-29: qty 5

## 2017-03-29 MED ORDER — FENTANYL CITRATE (PF) 100 MCG/2ML IJ SOLN
100.0000 ug | Freq: Once | INTRAMUSCULAR | Status: AC
  Administered 2017-03-29: 100 ug via INTRAVENOUS
  Filled 2017-03-29: qty 2

## 2017-03-29 MED ORDER — CEFAZOLIN SODIUM-DEXTROSE 1-4 GM/50ML-% IV SOLN
1.0000 g | Freq: Four times a day (QID) | INTRAVENOUS | Status: AC
  Administered 2017-03-29 – 2017-03-30 (×2): 1 g via INTRAVENOUS
  Filled 2017-03-29 (×2): qty 50

## 2017-03-29 MED ORDER — LACTATED RINGERS IV SOLN
INTRAVENOUS | Status: DC
  Administered 2017-03-29: 15:00:00 via INTRAVENOUS

## 2017-03-29 MED ORDER — FENTANYL CITRATE (PF) 100 MCG/2ML IJ SOLN
INTRAMUSCULAR | Status: DC | PRN
  Administered 2017-03-29 (×7): 50 ug via INTRAVENOUS
  Administered 2017-03-29: 100 ug via INTRAVENOUS
  Administered 2017-03-29 (×3): 50 ug via INTRAVENOUS
  Administered 2017-03-29: 100 ug via INTRAVENOUS
  Administered 2017-03-29: 50 ug via INTRAVENOUS

## 2017-03-29 MED ORDER — MIDAZOLAM HCL 2 MG/2ML IJ SOLN
2.0000 mg | Freq: Once | INTRAMUSCULAR | Status: AC
  Administered 2017-03-29: 2 mg via INTRAVENOUS
  Filled 2017-03-29: qty 2

## 2017-03-29 MED ORDER — PHENOL 1.4 % MT LIQD: 1.0000 | OROMUCOSAL | Status: DC | PRN

## 2017-03-29 MED ORDER — HYDROMORPHONE HCL 1 MG/ML IJ SOLN: 0.2500 mg | INTRAMUSCULAR | Status: DC | PRN

## 2017-03-29 MED ORDER — HYDROMORPHONE HCL 1 MG/ML IJ SOLN
INTRAMUSCULAR | Status: AC
Start: 2017-03-29 — End: 2017-03-29
  Filled 2017-03-29: qty 0.5

## 2017-03-29 MED ORDER — MIDAZOLAM HCL 5 MG/5ML IJ SOLN
INTRAMUSCULAR | Status: DC | PRN
  Administered 2017-03-29 (×2): 1 mg via INTRAVENOUS

## 2017-03-29 MED ORDER — METOCLOPRAMIDE HCL 5 MG/ML IJ SOLN: 5.0000 mg | Freq: Three times a day (TID) | INTRAMUSCULAR | Status: DC | PRN

## 2017-03-29 MED ORDER — LEVETIRACETAM 750 MG PO TABS
1500.0000 mg | ORAL_TABLET | Freq: Two times a day (BID) | ORAL | Status: DC
  Administered 2017-03-29 – 2017-03-31 (×4): 1500 mg via ORAL
  Filled 2017-03-29 (×5): qty 2

## 2017-03-29 MED ORDER — LABETALOL HCL 5 MG/ML IV SOLN
INTRAVENOUS | Status: DC | PRN
  Administered 2017-03-29: 5 mg via INTRAVENOUS

## 2017-03-29 MED ORDER — METOPROLOL TARTRATE 12.5 MG HALF TABLET
12.5000 mg | ORAL_TABLET | Freq: Two times a day (BID) | ORAL | Status: DC
  Administered 2017-03-29 – 2017-03-31 (×4): 12.5 mg via ORAL
  Filled 2017-03-29 (×4): qty 1

## 2017-03-29 MED ORDER — MAGNESIUM OXIDE 400 (241.3 MG) MG PO TABS
400.0000 mg | ORAL_TABLET | Freq: Two times a day (BID) | ORAL | Status: DC
  Administered 2017-03-29 – 2017-03-31 (×4): 400 mg via ORAL
  Filled 2017-03-29 (×4): qty 1

## 2017-03-29 MED ORDER — ROCURONIUM BROMIDE 10 MG/ML (PF) SYRINGE
PREFILLED_SYRINGE | INTRAVENOUS | Status: DC | PRN
  Administered 2017-03-29: 50 mg via INTRAVENOUS

## 2017-03-29 MED ORDER — SODIUM CHLORIDE 0.9 % IR SOLN
Status: DC | PRN
  Administered 2017-03-29: 1000 mL

## 2017-03-29 MED ORDER — METOCLOPRAMIDE HCL 5 MG PO TABS: 5.0000 mg | ORAL_TABLET | Freq: Three times a day (TID) | ORAL | Status: DC | PRN

## 2017-03-29 MED ORDER — HYDROMORPHONE HCL 1 MG/ML IJ SOLN
INTRAMUSCULAR | Status: AC
  Filled 2017-03-29: qty 0.5

## 2017-03-29 MED ORDER — SODIUM BICARBONATE 650 MG PO TABS
650.0000 mg | ORAL_TABLET | Freq: Two times a day (BID) | ORAL | Status: DC
  Administered 2017-03-29 – 2017-03-31 (×4): 650 mg via ORAL
  Filled 2017-03-29 (×4): qty 1

## 2017-03-29 MED ORDER — ONDANSETRON HCL 4 MG PO TABS: 4.0000 mg | ORAL_TABLET | Freq: Four times a day (QID) | ORAL | Status: DC | PRN

## 2017-03-29 MED ORDER — HYDROMORPHONE HCL 1 MG/ML IJ SOLN
INTRAMUSCULAR | Status: DC | PRN
  Administered 2017-03-29 (×2): .5 mg via INTRAVENOUS

## 2017-03-29 MED ORDER — MIDAZOLAM HCL 2 MG/2ML IJ SOLN
1.0000 mg | Freq: Once | INTRAMUSCULAR | Status: DC
  Filled 2017-03-29: qty 1

## 2017-03-29 MED ORDER — ACETAMINOPHEN 325 MG PO TABS: 650.0000 mg | ORAL_TABLET | Freq: Four times a day (QID) | ORAL | Status: DC | PRN

## 2017-03-29 MED ORDER — LIDOCAINE 2% (20 MG/ML) 5 ML SYRINGE
INTRAMUSCULAR | Status: DC | PRN
  Administered 2017-03-29: 40 mg via INTRAVENOUS

## 2017-03-29 MED ORDER — BUPIVACAINE-EPINEPHRINE (PF) 0.5% -1:200000 IJ SOLN
INTRAMUSCULAR | Status: DC | PRN
  Administered 2017-03-29: 30 mL via PERINEURAL

## 2017-03-29 MED ORDER — ACETAMINOPHEN 650 MG RE SUPP: 650.0000 mg | Freq: Four times a day (QID) | RECTAL | Status: DC | PRN

## 2017-03-29 MED ORDER — DIPHENOXYLATE-ATROPINE 2.5-0.025 MG PO TABS: 1.0000 | ORAL_TABLET | Freq: Four times a day (QID) | ORAL | Status: DC | PRN

## 2017-03-29 MED ORDER — DEXAMETHASONE 4 MG PO TABS
4.0000 mg | ORAL_TABLET | Freq: Every day | ORAL | Status: DC
  Administered 2017-03-30 – 2017-03-31 (×2): 4 mg via ORAL
  Filled 2017-03-29 (×2): qty 1

## 2017-03-29 MED ORDER — CHLORHEXIDINE GLUCONATE 4 % EX LIQD: 60.0000 mL | Freq: Once | CUTANEOUS | Status: DC

## 2017-03-29 MED ORDER — METHOCARBAMOL 500 MG PO TABS
500.0000 mg | ORAL_TABLET | Freq: Four times a day (QID) | ORAL | Status: DC | PRN
  Administered 2017-03-30 (×2): 500 mg via ORAL
  Filled 2017-03-29 (×2): qty 1

## 2017-03-29 MED ORDER — LOPERAMIDE HCL 2 MG PO CAPS
2.0000 mg | ORAL_CAPSULE | Freq: Two times a day (BID) | ORAL | Status: DC
  Administered 2017-03-29 – 2017-03-31 (×4): 2 mg via ORAL
  Filled 2017-03-29 (×4): qty 1

## 2017-03-29 MED ORDER — SUGAMMADEX SODIUM 200 MG/2ML IV SOLN
INTRAVENOUS | Status: DC | PRN
Start: 2017-03-29 — End: 2017-03-29
  Administered 2017-03-29: 150 mg via INTRAVENOUS

## 2017-03-29 MED ORDER — CEFAZOLIN SODIUM-DEXTROSE 2-4 GM/100ML-% IV SOLN
2.0000 g | INTRAVENOUS | Status: AC
  Administered 2017-03-29: 2 g via INTRAVENOUS
  Filled 2017-03-29: qty 100

## 2017-03-29 SURGICAL SUPPLY — 44 items
BANDAGE ACE 6X5 VEL STRL LF (GAUZE/BANDAGES/DRESSINGS) ×4 IMPLANT
BNDG COHESIVE 4X5 TAN STRL (GAUZE/BANDAGES/DRESSINGS) ×4 IMPLANT
BNDG GAUZE ELAST 4 BULKY (GAUZE/BANDAGES/DRESSINGS) ×4 IMPLANT
BNDG GAUZE STRTCH 6 (GAUZE/BANDAGES/DRESSINGS) ×12 IMPLANT
BRUSH SCRUB SURG 4.25 DISP (MISCELLANEOUS) ×8 IMPLANT
COVER SURGICAL LIGHT HANDLE (MISCELLANEOUS) ×8 IMPLANT
DRAPE C-ARM 35X43 STRL (DRAPES) ×4 IMPLANT
DRAPE C-ARMOR (DRAPES) ×4 IMPLANT
DRAPE U-SHAPE 47X51 STRL (DRAPES) ×4 IMPLANT
DRSG ADAPTIC 3X8 NADH LF (GAUZE/BANDAGES/DRESSINGS) ×4 IMPLANT
ELECT REM PT RETURN 9FT ADLT (ELECTROSURGICAL) ×4
ELECTRODE REM PT RTRN 9FT ADLT (ELECTROSURGICAL) ×2 IMPLANT
GAUZE SPONGE 4X4 12PLY STRL (GAUZE/BANDAGES/DRESSINGS) ×4 IMPLANT
GLOVE BIO SURGEON STRL SZ7.5 (GLOVE) ×4 IMPLANT
GLOVE BIO SURGEON STRL SZ8 (GLOVE) ×4 IMPLANT
GLOVE BIOGEL PI IND STRL 7.5 (GLOVE) ×2 IMPLANT
GLOVE BIOGEL PI IND STRL 8 (GLOVE) ×2 IMPLANT
GLOVE BIOGEL PI INDICATOR 7.5 (GLOVE) ×2
GLOVE BIOGEL PI INDICATOR 8 (GLOVE) ×2
GOWN STRL REUS W/ TWL LRG LVL3 (GOWN DISPOSABLE) ×4 IMPLANT
GOWN STRL REUS W/ TWL XL LVL3 (GOWN DISPOSABLE) ×2 IMPLANT
GOWN STRL REUS W/TWL LRG LVL3 (GOWN DISPOSABLE) ×4
GOWN STRL REUS W/TWL XL LVL3 (GOWN DISPOSABLE) ×2
HANDPIECE INTERPULSE COAX TIP (DISPOSABLE)
KIT BASIN OR (CUSTOM PROCEDURE TRAY) ×4 IMPLANT
KIT ROOM TURNOVER OR (KITS) ×4 IMPLANT
MANIFOLD NEPTUNE II (INSTRUMENTS) ×4 IMPLANT
NS IRRIG 1000ML POUR BTL (IV SOLUTION) ×4 IMPLANT
PACK ORTHO EXTREMITY (CUSTOM PROCEDURE TRAY) ×4 IMPLANT
PAD ABD 8X10 STRL (GAUZE/BANDAGES/DRESSINGS) ×4 IMPLANT
PAD ARMBOARD 7.5X6 YLW CONV (MISCELLANEOUS) ×8 IMPLANT
PADDING CAST COTTON 6X4 STRL (CAST SUPPLIES) ×4 IMPLANT
SET HNDPC FAN SPRY TIP SCT (DISPOSABLE) IMPLANT
SPONGE LAP 18X18 X RAY DECT (DISPOSABLE) ×4 IMPLANT
STOCKINETTE IMPERVIOUS 9X36 MD (GAUZE/BANDAGES/DRESSINGS) ×4 IMPLANT
SUT PDS AB 2-0 CT1 27 (SUTURE) ×8 IMPLANT
SWAB CULTURE ESWAB REG 1ML (MISCELLANEOUS) IMPLANT
TOWEL OR 17X24 6PK STRL BLUE (TOWEL DISPOSABLE) ×4 IMPLANT
TOWEL OR 17X26 10 PK STRL BLUE (TOWEL DISPOSABLE) ×8 IMPLANT
TUBE CONNECTING 12'X1/4 (SUCTIONS) ×1
TUBE CONNECTING 12X1/4 (SUCTIONS) ×3 IMPLANT
UNDERPAD 30X30 (UNDERPADS AND DIAPERS) ×4 IMPLANT
WATER STERILE IRR 1000ML POUR (IV SOLUTION) ×4 IMPLANT
YANKAUER SUCT BULB TIP NO VENT (SUCTIONS) ×4 IMPLANT

## 2017-03-29 NOTE — Anesthesia Procedure Notes (Addendum)
Anesthesia Regional Block: Femoral nerve block   Pre-Anesthetic Checklist: ,, timeout performed, Correct Patient, Correct Site, Correct Laterality, Correct Procedure, Correct Position, site marked, Risks and benefits discussed,  Surgical consent,  Pre-op evaluation,  At surgeon's request and post-op pain management  Laterality: Left  Prep: chloraprep       Needles:  Injection technique: Single-shot  Needle Type: Stimulator Needle - 80     Needle Length: 9cm  Needle Gauge: 22   Needle insertion depth: 6 cm   Additional Needles:   Procedures:, nerve stimulator,,,,,,,   Nerve Stimulator or Paresthesia:  Response: Patellar snap, 0.5 mA,   Additional Responses:   Narrative:  Start time: 03/29/2017 1:45 PM End time: 03/30/2017 2:30 PM Injection made incrementally with aspirations every 5 mL.  Performed by: Personally  Anesthesiologist: Nolon Nations  Additional Notes: BP cuff, EKG monitors applied. Sedation begun. Femoral artery palpated for location of nerve. After nerve location verified with U/S. Attempted femoral nerve catheter placement, but could not thread catheter. Single shot block done. Anesthetic injected incrementally, slowly, and after negative aspirations under direct u/s guidance. Good perineural spread verified with ultrasound. Patient tolerated well.

## 2017-03-29 NOTE — Progress Notes (Signed)
Dr. Sabra Heck made aware of patient's elevated BP.  No new orders received.  Will continue to monitor patient.

## 2017-03-29 NOTE — Transfer of Care (Signed)
Immediate Anesthesia Transfer of Care Note  Patient: [place]  Procedure(s) Performed: Procedure(s): OPEN LYSIS OF ADHESION (N/A) MANIPULATION OF LEFT KNEE (Left)  Patient Location: PACU  Anesthesia Type:General  Level of Consciousness: awake, alert , oriented and patient cooperative  Airway & Oxygen Therapy: Patient Spontanous Breathing  Post-op Assessment: Report given to RN and Post -op Vital signs reviewed and stable  Post vital signs: Reviewed and stable  Last Vitals:  Vitals:   03/29/17 1430 03/29/17 1435  BP: (!) 202/91 (!) 186/84  Pulse: 91 (!) 108  Resp:    Temp:    SpO2: 100% 100%    Last Pain:  Vitals:   03/29/17 0918  TempSrc: Oral         Complications: No apparent anesthesia complications

## 2017-03-29 NOTE — Anesthesia Postprocedure Evaluation (Signed)
Anesthesia Post Note  Patient: Fish farm manager) Performed: Procedure(s) (LRB): OPEN LYSIS OF ADHESION (N/A) MANIPULATION OF LEFT KNEE (Left)     Patient location during evaluation: PACU Anesthesia Type: General and Regional Level of consciousness: awake and alert Pain management: pain level controlled Vital Signs Assessment: post-procedure vital signs reviewed and stable Respiratory status: spontaneous breathing, nonlabored ventilation, respiratory function stable and patient connected to nasal cannula oxygen Cardiovascular status: blood pressure returned to baseline and stable Postop Assessment: no signs of nausea or vomiting Anesthetic complications: no    Last Vitals:  Vitals:   03/29/17 1637 03/29/17 1645  BP: (!) 175/90   Pulse: 86 82  Resp: 13 12  Temp: (!) 36 C   SpO2: (!) 89% 100%    Last Pain:  Vitals:   03/29/17 0918  TempSrc: Oral                 Gerhart Ruggieri,W. EDMOND

## 2017-03-29 NOTE — Brief Op Note (Signed)
03/29/2017  4:57 PM  PATIENT:  Barbara Matthews  61 y.o. female  PRE-OPERATIVE DIAGNOSIS:   1. ARTHROFIBROSIS OF THE LEFT KNEE 2. QUADRICEPT CONTRACTURE  POST-OPERATIVE DIAGNOSIS:  1. ARTHROFIBROSIS OF THE LEFT KNEE 2. QUADRICEPT CONTRACTURE  PROCEDURE:  Procedure(s): 1. LEFT KNEE CAPSULECTOMY 2. QUADRICEPLASTY 3. MANIPULATION OF LEFT KNEE (Left)  SURGEON:  Surgeon(s) and Role:    Altamese [place], MD - Primary  PHYSICIAN ASSISTANT: Ainsley Spinner, PA-C  ANESTHESIA:   general  EBL:  Total I/O In: 900 [I.V.:900] Out: 50 [Blood:50]  BLOOD ADMINISTERED:none  DRAINS: none   LOCAL MEDICATIONS USED:  NONE  SPECIMEN:  No Specimen  DISPOSITION OF SPECIMEN:  N/A  COUNTS:  YES  TOURNIQUET:  * No tourniquets in log *  DICTATION: .Other Dictation: Dictation Number [id number]  PLAN OF CARE: Admit to inpatient   PATIENT DISPOSITION:  PACU - hemodynamically stable.   Delay start of Pharmacological VTE agent (>24hrs) due to surgical blood loss or risk of bleeding: no

## 2017-03-29 NOTE — Anesthesia Preprocedure Evaluation (Signed)
Anesthesia Evaluation  Patient identified by MRN, date of birth, ID band Patient awake    Reviewed: Allergy & Precautions, H&P , NPO status , Patient's Chart, lab work & pertinent test results  Airway Mallampati: II  TM Distance: >3 FB Neck ROM: Full    Dental no notable dental hx. (+) Edentulous Upper, Partial Lower, Dental Advisory Given   Pulmonary neg pulmonary ROS, former smoker,    Pulmonary exam normal breath sounds clear to auscultation       Cardiovascular hypertension, Pt. on medications and Pt. on home beta blockers  Rhythm:Regular Rate:Normal     Neuro/Psych Seizures -, Well Controlled,  Anxiety negative psych ROS   GI/Hepatic negative GI ROS, Neg liver ROS,   Endo/Other  diabetes, Type 2, Oral Hypoglycemic Agents  Renal/GU negative Renal ROS  negative genitourinary   Musculoskeletal  (+) Arthritis , Osteoarthritis,    Abdominal   Peds  Hematology negative hematology ROS (+) anemia ,   Anesthesia Other Findings   Reproductive/Obstetrics negative OB ROS                             Anesthesia Physical Anesthesia Plan  ASA: III  Anesthesia Plan: General   Post-op Pain Management:  Regional for Post-op pain   Induction: Intravenous  PONV Risk Score and Plan: 4 or greater and Ondansetron, Dexamethasone and Midazolam  Airway Management Planned: Oral ETT  Additional Equipment:   Intra-op Plan:   Post-operative Plan: Extubation in OR  Informed Consent: I have reviewed the patients History and Physical, chart, labs and discussed the procedure including the risks, benefits and alternatives for the proposed anesthesia with the patient or authorized representative who has indicated his/her understanding and acceptance.   Dental advisory given  Plan Discussed with: CRNA  Anesthesia Plan Comments:         Anesthesia Quick Evaluation

## 2017-03-29 NOTE — Anesthesia Procedure Notes (Signed)
Procedure Name: Intubation Date/Time: 03/29/2017 2:55 PM Performed by: Salli Quarry Adriana Quinby Pre-anesthesia Checklist: Patient identified, Emergency Drugs available, Suction available and Patient being monitored Patient Re-evaluated:Patient Re-evaluated prior to induction Oxygen Delivery Method: Circle System Utilized Preoxygenation: Pre-oxygenation with 100% oxygen Induction Type: IV induction Ventilation: Mask ventilation without difficulty Laryngoscope Size: Mac and 3 Grade View: Grade I Tube type: Oral Tube size: 7.0 mm Number of attempts: 1 Airway Equipment and Method: Stylet and Oral airway Placement Confirmation: ETT inserted through vocal cords under direct vision,  positive ETCO2 and breath sounds checked- equal and bilateral Secured at: 22 cm Tube secured with: Tape Dental Injury: Teeth and Oropharynx as per pre-operative assessment

## 2017-03-29 NOTE — Progress Notes (Signed)
Orthopedic Tech Progress Note Patient Details:  Barbara Matthews 1956-06-02 [id number]  CPM Left Knee CPM Left Knee: On Left Knee Flexion (Degrees): 90 Left Knee Extension (Degrees): 0   [name] 03/29/2017, 7:00 PM
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
## 2017-03-30 ENCOUNTER — Encounter (HOSPITAL_COMMUNITY): Payer: Self-pay | Admitting: Orthopedic Surgery

## 2017-03-30 DIAGNOSIS — Z95828 Presence of other vascular implants and grafts: Secondary | ICD-10-CM

## 2017-03-30 DIAGNOSIS — M24662 Ankylosis, left knee: Secondary | ICD-10-CM

## 2017-03-30 HISTORY — DX: Presence of other vascular implants and grafts: Z95.828

## 2017-03-30 HISTORY — DX: Ankylosis, left knee: M24.662

## 2017-03-30 LAB — GLUCOSE, CAPILLARY
GLUCOSE-CAPILLARY: 215 mg/dL — AB (ref 65–99)
Glucose-Capillary: 259 mg/dL — ABNORMAL HIGH (ref 65–99)
Glucose-Capillary: 209 mg/dL — ABNORMAL HIGH (ref 65–99)

## 2017-03-30 LAB — BASIC METABOLIC PANEL
ANION GAP: 8 (ref 5–15)
BUN: 11 mg/dL (ref 6–20)
CALCIUM: 9 mg/dL (ref 8.9–10.3)
CHLORIDE: 105 mmol/L (ref 101–111)
CO2: 27 mmol/L (ref 22–32)
Creatinine, Ser: 0.77 mg/dL (ref 0.44–1.00)
GFR calc non Af Amer: >60 mL/min (ref >60)
Glucose, Bld: 167 mg/dL — ABNORMAL HIGH (ref 65–99)
Potassium: 3.6 mmol/L (ref 3.5–5.1)
Sodium: 140 mmol/L (ref 135–145)

## 2017-03-30 LAB — CBC
HEMATOCRIT: 28.3 % — AB (ref 36.0–46.0)
HEMOGLOBIN: 8.8 g/dL — AB (ref 12.0–15.0)
MCH: 24.9 pg — ABNORMAL LOW (ref 26.0–34.0)
MCHC: 31.1 g/dL (ref 30.0–36.0)
MCV: 80.2 fL (ref 78.0–100.0)
Platelets: 237 10*3/uL (ref 150–400)
RBC: 3.53 MIL/uL — AB (ref 3.87–5.11)
RDW: 17.4 % — ABNORMAL HIGH (ref 11.5–15.5)
WBC: 10.7 10*3/uL — AB (ref 4.0–10.5)

## 2017-03-30 MED ORDER — INSULIN ASPART 100 UNIT/ML ~~LOC~~ SOLN
0.0000 [IU] | Freq: Three times a day (TID) | SUBCUTANEOUS | Status: DC
  Administered 2017-03-30: 3 [IU] via SUBCUTANEOUS
  Administered 2017-03-30 – 2017-03-31 (×2): 5 [IU] via SUBCUTANEOUS
  Administered 2017-03-31: 2 [IU] via SUBCUTANEOUS
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
## 2017-03-30 NOTE — Addendum Note (Signed)
Addendum  created 03/30/17 1449 by Nolon Nations, MD   Anesthesia Intra Blocks edited, Sign clinical note

## 2017-03-30 NOTE — Progress Notes (Signed)
Orthopedic Trauma Service Progress Note   Patient ID: Barbara Matthews MRN: [id number] DOB/AGE: 11/06/1955 61 y.o.  Subjective:  Doing very well Pain controlled  In CPM this am  Appetite is good No specific complaints or concerns  Block still appears to be effective    Review of Systems  Constitutional: Negative for chills and fever.  Respiratory: Negative for shortness of breath and wheezing.   Cardiovascular: Negative for chest pain and palpitations.  Gastrointestinal: Negative for abdominal pain, nausea and vomiting.  Neurological: Negative for tingling and sensory change.      Objective:   VITALS:   Vitals:   03/29/17 2047 03/30/17 0027 03/30/17 0442 03/30/17 0827  BP:  (!) 117/57 (!) 130/55 (!) 142/73  Pulse:  80 75 84  Resp:  15 16   Temp:  98.3 F (36.8 C) 98.2 F (36.8 C) 98.4 F (36.9 C)  TempSrc:  Oral Oral Oral  SpO2: 100% 100% 100% 100%  Weight:        Intake/Output      08/21 0701 - 08/22 0700 08/22 0701 - 08/23 0700   P.O. 1080    I.V. (mL/kg) 900 (14.1)    Total Intake(mL/kg) 1980 (31)    Urine (mL/kg/hr) 400    Drains 100    Blood 50    Total Output 550     Net +1430            LABS  Results for orders placed or performed during the hospital encounter of 03/29/17 (from the past 24 hour(s))  Hemoglobin A1c     Status: Abnormal   Collection Time: 03/29/17  9:24 AM  Result Value Ref Range   Hgb A1c MFr Bld 6.5 (H) 4.8 - 5.6 %   Mean Plasma Glucose 139.85 mg/dL  Comprehensive metabolic panel     Status: Abnormal   Collection Time: 03/29/17  9:25 AM  Result Value Ref Range   Sodium 141 135 - 145 mmol/L   Potassium 3.4 (L) 3.5 - 5.1 mmol/L   Chloride 108 101 - 111 mmol/L   CO2 24 22 - 32 mmol/L   Glucose, Bld 133 (H) 65 - 99 mg/dL   BUN 15 6 - 20 mg/dL   Creatinine, Ser 0.77 0.44 - 1.00 mg/dL   Calcium 9.5 8.9 - 10.3 mg/dL   Total Protein 6.5 6.5 - 8.1 g/dL   Albumin 3.1 (L) 3.5 - 5.0 g/dL   AST 40 15 - 41 U/L    ALT 58 (H) 14 - 54 U/L   Alkaline Phosphatase 165 (H) 38 - 126 U/L   Total Bilirubin 0.7 0.3 - 1.2 mg/dL   GFR calc non Af Amer >60 >60 mL/min   GFR calc Af Amer >60 >60 mL/min   Anion gap 9 5 - 15  CBC WITH DIFFERENTIAL     Status: Abnormal   Collection Time: 03/29/17  9:25 AM  Result Value Ref Range   WBC 9.2 4.0 - 10.5 K/uL   RBC 4.20 3.87 - 5.11 MIL/uL   Hemoglobin 10.7 (L) 12.0 - 15.0 g/dL   HCT 33.6 (L) 36.0 - 46.0 %   MCV 80.0 78.0 - 100.0 fL   MCH 25.5 (L) 26.0 - 34.0 pg   MCHC 31.8 30.0 - 36.0 g/dL   RDW 17.5 (H) 11.5 - 15.5 %   Platelets 288 150 - 400 K/uL   Neutrophils Relative % 76 %   Neutro Abs 7.0 1.7 - 7.7 K/uL   Lymphocytes Relative 17 %  Lymphs Abs 1.6 0.7 - 4.0 K/uL   Monocytes Relative 6 %   Monocytes Absolute 0.5 0.1 - 1.0 K/uL   Eosinophils Relative 1 %   Eosinophils Absolute 0.1 0.0 - 0.7 K/uL   Basophils Relative 0 %   Basophils Absolute 0.0 0.0 - 0.1 K/uL  Protime-INR     Status: None   Collection Time: 03/29/17  9:25 AM  Result Value Ref Range   Prothrombin Time 13.5 11.4 - 15.2 seconds   INR 1.03   APTT     Status: None   Collection Time: 03/29/17  9:25 AM  Result Value Ref Range   aPTT 28 24 - 36 seconds  Glucose, capillary     Status: Abnormal   Collection Time: 03/29/17  9:55 AM  Result Value Ref Range   Glucose-Capillary 138 (H) 65 - 99 mg/dL   Comment 1 Notify RN    Comment 2 Document in Chart   Glucose, capillary     Status: Abnormal   Collection Time: 03/29/17 12:12 PM  Result Value Ref Range   Glucose-Capillary 153 (H) 65 - 99 mg/dL  Glucose, capillary     Status: Abnormal   Collection Time: 03/29/17  2:35 PM  Result Value Ref Range   Glucose-Capillary 136 (H) 65 - 99 mg/dL  CBC     Status: Abnormal   Collection Time: 03/30/17  3:20 AM  Result Value Ref Range   WBC 10.7 (H) 4.0 - 10.5 K/uL   RBC 3.53 (L) 3.87 - 5.11 MIL/uL   Hemoglobin 8.8 (L) 12.0 - 15.0 g/dL   HCT 28.3 (L) 36.0 - 46.0 %   MCV 80.2 78.0 - 100.0 fL    MCH 24.9 (L) 26.0 - 34.0 pg   MCHC 31.1 30.0 - 36.0 g/dL   RDW 17.4 (H) 11.5 - 15.5 %   Platelets 237 150 - 400 K/uL  Basic metabolic panel     Status: Abnormal   Collection Time: 03/30/17  3:20 AM  Result Value Ref Range   Sodium 140 135 - 145 mmol/L   Potassium 3.6 3.5 - 5.1 mmol/L   Chloride 105 101 - 111 mmol/L   CO2 27 22 - 32 mmol/L   Glucose, Bld 167 (H) 65 - 99 mg/dL   BUN 11 6 - 20 mg/dL   Creatinine, Ser 0.77 0.44 - 1.00 mg/dL   Calcium 9.0 8.9 - 10.3 mg/dL   GFR calc non Af Amer >60 >60 mL/min   GFR calc Af Amer >60 >60 mL/min   Anion gap 8 5 - 15     PHYSICAL EXAM:   Gen: awake and alert, NAD, resting comfortably in bed Lungs: clear anterior fields clear Cardiac: regular  Abd: + BS Ext:       Left Lower Extremity   Dressing c/d/i  hemovac patent, appears to be functioning   Ext warm  + DP pulses  Able to move toes   No swelling of significance  Pt in CPM    Ranging from 0-55 degrees   Assessment/Plan: 1 Day Post-Op   Principal Problem:   Arthrofibrosis of knee joint, left Active Problems:   Essential hypertension   Diabetes mellitus type 2, uncontrolled, with complications (HCC)   Colostomy care ([place])   Anemia of chronic disease   Contracture of left knee   Port-a-cath in place   Anti-infectives    Start     Dose/Rate Route Frequency Ordered Stop   03/29/17 2130  ceFAZolin (ANCEF) IVPB 1 g/50  mL premix     1 g 100 mL/hr over 30 Minutes Intravenous Every 6 hours 03/29/17 2047 03/30/17 0410   03/29/17 0915  ceFAZolin (ANCEF) IVPB 2g/100 mL premix     2 g 200 mL/hr over 30 Minutes Intravenous On call to O.R. 03/29/17 5248 03/29/17 1503    .  POD/HD#: 1  61 y/o female s/p L knee capsulotomy, quadricepsplasty, MUA of L knee for L knee contracture s/p ORIF L distal femur  -L knee contracture s/p L knee capsulotomy, quadricepsplasty, MUA of L knee             CPM for 4-6 hrs/day, continue to increase by 5-10 degrees/day  WBAT L leg  PT/OT    Ice prn  Dressing change tomorrow with removal of drain  Outpatient PT at dc to help work on ROM  Will try to get home CPM      - Medical issues              medical issues stable   DM   SSI while inpatient   Hold home meds for now   HTN   Metoprolol restarted   BPs better than intra-op   Monitor     - DVT/PE prophylaxis:             Lovenox    - ID:              periop abx      - Dispo:             therapies  Dc home tomorrow    Jari Pigg, PA-C [facility] [phone] (P) [phone] (O) 03/30/2017, 9:00 AM

## 2017-03-30 NOTE — Op Note (Signed)
NAME:  Barbara Matthews, Barbara Matthews NO.:  MEDICAL RECORD NO.:  [id number]  LOCATION:                                 FACILITY:  PHYSICIAN:  Astrid Divine. Marcelino Scot, M.D. DATE OF BIRTH:  1956/03/01  DATE OF PROCEDURE:  03/29/2017 DATE OF DISCHARGE:                              OPERATIVE REPORT   PREOPERATIVE DIAGNOSES: 1. Arthrofibrosis of the left knee. 2. 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  POSTOPERATIVE DIAGNOSES: 1. Arthrofibrosis of the left knee. 2. 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  PROCEDURES: 1. Left knee capsulectomy. 2. Quadricepsplasty. 3. Manipulation of left knee.  SURGEON:  Astrid Divine. Marcelino Scot, M.D.  ASSISTANT:  Ainsley Spinner, PA-C.  ANESTHESIA:  General.  COMPLICATIONS:  None.  TOURNIQUET:  None.  I/O:  900 mL crystalloid.  ESTIMATED BLOOD LOSS:  150.  DISPOSITION:  To PACU.  CONDITION:  Stable.  BRIEF SUMMARY OF INDICATIONS FOR PROCEDURE:  Barbara Matthews is a pleasant, 61 year old female with metastatic carcinoma, who underwent repair of a femur fracture over 8 months ago.  She developed a postoperative contracture that was quite severe and limited her flexion to no more than 60 degrees.  Because of confounding health factor, she was in and out of the hospital and could not have this addressed at an early time.  She continues to be hampered in her daily activities because of this and presents for surgical treatment.  I did discuss with her the risks and benefits including the potential for recurrence of the contracture, fracture, nerve injury, vessel injury, infection, heart attack, stroke, among others as well as thromboembolic disease, which has increased given her metastatic disease.  She did wish to proceed.  BRIEF SUMMARY OF PROCEDURE:  The patient was taken to the operating room where general anesthesia was induced.  Her  left lower extremity was prepped and draped in usual sterile fashion.  No tourniquet was used during the procedure that one was placed in case required.  After a time- out, the old lateral incision at the knee was remade, dissection was carried down to the level of the plate.  The scar was so thick that I elected to begin with the quadricepsplasty before moving to the joint capsule to establish the appropriate plane and margin for subsequent capsule resection.  Consequently, I went over the plate and over the periosteum proximally.  Here, I was able to isolate the quadriceps from the underlying tissue and began in stepwise fashion to mobilize it with a Cobb as well as digital pressure.  This was continued distally and immediately with the knee in extension and then in some degrees of flexion.  I also used a Cobb along the medial side and under direct visualization, was able to incise and mobilize the quadriceps along the medial side as well.  The skin incision had been extended distally to begin to access the knee capsule.  The distal edge of the plate and anteriorly was isolated.  I began to identify what would be about 1 cm thick of scar tissue and fibrosis along the knee  joint, taking my finger inside the joint.  I was able to come along the retinaculum and identify again about 1 cm worth of scar. As I continued proximally into the quadriceps in the area of mobilization and quadricepsplasty, this tissue was greater than 1 cm and going deep, I was able to excise it in bulk with the rongeur.  I then turned my attention back to the capsule, having established base and coming up from that to my finger that was inside the joint to identify the inferior and deep edge from the quadricepsplasty.  I was able to identify the superior edge and resect the tissue in between the patellar cartilage and trochlear cartilage was protected at all time and no instrument was ever directed towards the  articular cartilage.  There were some adhesions between the articular surfaces and I was able with distraction to remove those with a rongeur without making any contact with the joint cartilage itself.  Following removal of these intra- articular adhesions and resection of the anterior capsule and quadricepsplasty, I then did attempt to manipulate the knee.  With my assistant stabilizing the femur, I brought the knee into gentle flexion and continued to apply gentle pressure.  This did result in some tachycardia and hypertension and was indicating significant discomfort. With the assistance of Anesthesia, this was controlled pharmacologically and the knee was brought back into extension.  I then brought it back up into flexion following administration of anesthetic medicines and was able to resume the manipulation, achieving 125 to 130 degrees of flexion through gentle sustained force.  C-arm was brought in to confirm there has been no fracture or other injury or loss of fixation and no such injury was seen or visualized.  Wound was irrigated thoroughly and then closed in standard layered fashion.  Ainsley Spinner, PA-C, did assist me throughout with all portions of the procedure including with retraction which was very difficult for the quadricepsplasty given the extent of scar as well as for capsulectomy and manipulation.  He also assisted with closure.  PROGNOSIS:  Barbara Matthews to reduce the chance of recurrence.  She has pictures of her maximal knee flexion.  She is at increased risk of complications given all of her multiple comorbidities. Of note, we did attempt to place a femoral nerve catheter, but this was unsuccessful despite numerous attempts from the Anesthesia Service given the extensive scarring in the area.    Astrid Divine. Marcelino Scot, M.D.    MHH/MEDQ  D:  03/29/2017  T:  03/30/2017  Job:  [id number]

## 2017-03-31 LAB — CBC
HCT: 27.5 % — ABNORMAL LOW (ref 36.0–46.0)
HEMOGLOBIN: 8.5 g/dL — AB (ref 12.0–15.0)
MCH: 25 pg — ABNORMAL LOW (ref 26.0–34.0)
MCHC: 30.9 g/dL (ref 30.0–36.0)
MCV: 80.9 fL (ref 78.0–100.0)
Platelets: 214 10*3/uL (ref 150–400)
RBC: 3.4 MIL/uL — ABNORMAL LOW (ref 3.87–5.11)
RDW: 17.7 % — AB (ref 11.5–15.5)
WBC: 7.3 10*3/uL (ref 4.0–10.5)

## 2017-03-31 LAB — GLUCOSE, CAPILLARY
GLUCOSE-CAPILLARY: 172 mg/dL — AB (ref 65–99)
GLUCOSE-CAPILLARY: 287 mg/dL — AB (ref 65–99)
Glucose-Capillary: 113 mg/dL — ABNORMAL HIGH (ref 65–99)

## 2017-03-31 LAB — BASIC METABOLIC PANEL
ANION GAP: 4 — AB (ref 5–15)
BUN: 12 mg/dL (ref 6–20)
CALCIUM: 9.1 mg/dL (ref 8.9–10.3)
CHLORIDE: 105 mmol/L (ref 101–111)
CO2: 31 mmol/L (ref 22–32)
CREATININE: 0.59 mg/dL (ref 0.44–1.00)
GFR calc non Af Amer: >60 mL/min (ref >60)
Glucose, Bld: 136 mg/dL — ABNORMAL HIGH (ref 65–99)
Potassium: 3.5 mmol/L (ref 3.5–5.1)
SODIUM: 140 mmol/L (ref 135–145)

## 2017-03-31 MED ORDER — METHOCARBAMOL 500 MG PO TABS: 500.0000 mg | ORAL_TABLET | Freq: Four times a day (QID) | ORAL | 1 refills | Status: DC | PRN

## 2017-03-31 MED ORDER — HYDROCODONE-ACETAMINOPHEN 7.5-325 MG PO TABS: 1.0000 | ORAL_TABLET | Freq: Four times a day (QID) | ORAL | 0 refills | Status: DC | PRN

## 2017-03-31 NOTE — Progress Notes (Signed)
Physical Therapy Treatment Patient Details Name: Barbara Matthews MRN: [id number] DOB: 07-03-1956 Today's Date: 03/31/2017    History of Present Illness  Barbara Matthews is an 61 y.o.black female with complex medical history including ileostomy, type 2 diabetes, metastatic colorectal cancer with lung mets who sustained a closed fracture of her left distal femur in February 2018 and underwent ORIF.  Developed postop knee contracture with arthrofibrosis and presented for quadricepsplasty, L knee capsulotomy and manipulation.    PT Comments    Pt making great progress towards achieving her current functional goals. Pt would continue to benefit from skilled physical therapy services at this time while admitted and after d/c to address the below listed limitations in order to improve overall safety and independence with functional mobility. PT will continue to follow acutely to ensure a safe d/c home. Plan to practice stairs next session.    Follow Up Recommendations  Outpatient PT     Equipment Recommendations  None recommended by PT    Recommendations for Other Services       Precautions / Restrictions Precautions Precautions: Fall Restrictions Weight Bearing Restrictions: Yes LLE Weight Bearing: Weight bearing as tolerated    Mobility  Bed Mobility               General bed mobility comments: pt sitting EOB upon arrival  Transfers Overall transfer level: Needs assistance Equipment used: Rolling walker (2 wheeled) Transfers: Sit to/from Stand Sit to Stand: Supervision         General transfer comment: good technique, supervision for safety  Ambulation/Gait Ambulation/Gait assistance: Supervision Ambulation Distance (Feet): 200 Feet (100' x2 with sitting rest break) Assistive device: Rolling walker (2 wheeled) Gait Pattern/deviations: Step-through pattern;Decreased stride length;Antalgic Gait velocity: decreased Gait velocity interpretation: Below normal speed for  age/gender General Gait Details: mild instability but no overt LOB, mildly antalgic pattern; pt required frequent cueing for postural corrections. pt required one sitting rest break secondary to fatigue.   Stairs            Wheelchair Mobility    Modified Rankin (Stroke Patients Only)       Balance Overall balance assessment: Needs assistance Sitting-balance support: No upper extremity supported;Feet supported Sitting balance-Leahy Scale: Good     Standing balance support: During functional activity;No upper extremity supported Standing balance-Leahy Scale: Fair                              Cognition Arousal/Alertness: Awake/alert Behavior During Therapy: Flat affect Overall Cognitive Status: Within Functional Limits for tasks assessed                                        Exercises      General Comments        Pertinent Vitals/Pain Pain Assessment: No/denies pain    Home Living                      Prior Function            PT Goals (current goals can now be found in the care plan section) Acute Rehab PT Goals PT Goal Formulation: With patient Time For Goal Achievement: 04/06/17 Potential to Achieve Goals: Good Progress towards PT goals: Progressing toward goals    Frequency    Min 5X/week      PT Plan Current plan  remains appropriate    Co-evaluation              AM-PAC PT "6 Clicks" Daily Activity  Outcome Measure  Difficulty turning over in bed (including adjusting bedclothes, sheets and blankets)?: None Difficulty moving from lying on back to sitting on the side of the bed? : None Difficulty sitting down on and standing up from a chair with arms (e.g., wheelchair, bedside commode, etc,.)?: A Little Help needed moving to and from a bed to chair (including a wheelchair)?: None Help needed walking in hospital room?: A Little Help needed climbing 3-5 steps with a railing? : A Little 6 Click  Score: 21    End of Session   Activity Tolerance: Patient tolerated treatment well Patient left: in chair;with call bell/phone within reach;with family/visitor present Nurse Communication: Mobility status PT Visit Diagnosis: Difficulty in walking, not elsewhere classified (R26.2);Other abnormalities of gait and mobility (R26.89)     Time: 1022-1110 PT Time Calculation (min) (ACUTE ONLY): 48 min  Charges:  $Gait Training: 8-22 mins $Therapeutic Activity: 23-37 mins                    G Codes:       [place], [place], [place] Wiggins 03/31/2017, 11:20 AM

## 2017-03-31 NOTE — Discharge Instructions (Signed)
Orthopaedic Trauma Service Discharge Instructions   General Discharge Instructions  WEIGHT BEARING STATUS: weight bearing as tolerated left leg  RANGE OF MOTION/ACTIVITY:range of motion as tolerated left knee. Active and passive motion.Utilize continuous passive motion machine for 6 hours a day. 0-90. Okay to breakup sessions into 2 or 3 hour increments. Ice after sessions  Wound Care: daily wound care starting on 04/02/2017. Please see instructions below  Discharge Wound Care Instructions  Do NOT apply any ointments, solutions or lotions to pin sites or surgical wounds.  These prevent needed drainage and even though solutions like hydrogen peroxide kill bacteria, they also damage cells lining the pin sites that help fight infection.  Applying lotions or ointments can keep the wounds moist and can cause them to breakdown and open up as well. This can increase the risk for infection. When in doubt call the office.  Surgical incisions should be dressed daily.  If any drainage is noted, use one layer of adaptic, then gauze, Kerlix, and an ace wrap.  Once the incision is completely dry and without drainage, it may be left open to air out.  Showering may begin 36-48 hours later.  Cleaning gently with soap and water.  Traumatic wounds should be dressed daily as well.    One layer of adaptic, gauze, Kerlix, then ace wrap.  The adaptic can be discontinued once the draining has ceased    If you have a wet to dry dressing: wet the gauze with saline the squeeze as much saline out so the gauze is moist (not soaking wet), place moistened gauze over wound, then place a dry gauze over the moist one, followed by Kerlix wrap, then ace wrap.  Diet: as you were eating previously.  Can use over the counter stool softeners and bowel preparations, such as Miralax, to help with bowel movements.  Narcotics can be constipating.  Be sure to drink plenty of fluids  PAIN MEDICATION USE AND EXPECTATIONS  You have  likely been given narcotic medications to help control your pain.  After a traumatic event that results in an fracture (broken bone) with or without surgery, it is ok to use narcotic pain medications to help control one's pain.  We understand that everyone responds to pain differently and each individual patient will be evaluated on a regular basis for the continued need for narcotic medications. Ideally, narcotic medication use should last no more than 6-8 weeks (coinciding with fracture healing).   As a patient it is your responsibility as well to monitor narcotic medication use and report the amount and frequency you use these medications when you come to your office visit.   We would also advise that if you are using narcotic medications, you should take a dose prior to therapy to maximize you participation.  IF YOU ARE ON NARCOTIC MEDICATIONS IT IS NOT PERMISSIBLE TO OPERATE A MOTOR VEHICLE (MOTORCYCLE/CAR/TRUCK/MOPED) OR HEAVY MACHINERY DO NOT MIX NARCOTICS WITH OTHER CNS (CENTRAL NERVOUS SYSTEM) DEPRESSANTS SUCH AS ALCOHOL   STOP SMOKING OR USING NICOTINE PRODUCTS!!!!  As discussed nicotine severely impairs your body's ability to heal surgical and traumatic wounds but also impairs bone healing.  Wounds and bone heal by forming microscopic blood vessels (angiogenesis) and nicotine is a vasoconstrictor (essentially, shrinks blood vessels).  Therefore, if vasoconstriction occurs to these microscopic blood vessels they essentially disappear and are unable to deliver necessary nutrients to the healing tissue.  This is one modifiable factor that you can do to dramatically increase your chances of  healing your injury.    (This means no smoking, no nicotine gum, patches, etc)  DO NOT USE NONSTEROIDAL ANTI-INFLAMMATORY DRUGS (NSAID'S)  Using products such as Advil (ibuprofen), Aleve (naproxen), Motrin (ibuprofen) for additional pain control during fracture healing can delay and/or prevent the healing  response.  If you would like to take over the counter (OTC) medication, Tylenol (acetaminophen) is ok.  However, some narcotic medications that are given for pain control contain acetaminophen as well. Therefore, you should not exceed more than 4000 mg of tylenol in a day if you do not have liver disease.  Also note that there are may OTC medicines, such as cold medicines and allergy medicines that my contain tylenol as well.  If you have any questions about medications and/or interactions please ask your doctor/PA or your pharmacist.      ICE AND ELEVATE INJURED/OPERATIVE EXTREMITY  Using ice and elevating the injured extremity above your heart can help with swelling and pain control.  Icing in a pulsatile fashion, such as 20 minutes on and 20 minutes off, can be followed.    Do not place ice directly on skin. Make sure there is a barrier between to skin and the ice pack.    Using frozen items such as frozen peas works well as the conform nicely to the are that needs to be iced.  USE AN ACE WRAP OR TED HOSE FOR SWELLING CONTROL  In addition to icing and elevation, Ace wraps or TED hose are used to help limit and resolve swelling.  It is recommended to use Ace wraps or TED hose until you are informed to stop.    When using Ace Wraps start the wrapping distally (farthest away from the body) and wrap proximally (closer to the body)   Example: If you had surgery on your leg or thing and you do not have a splint on, start the ace wrap at the toes and work your way up to the thigh        If you had surgery on your upper extremity and do not have a splint on, start the ace wrap at your fingers and work your way up to the upper arm  IF YOU ARE IN A SPLINT OR CAST DO NOT [place]   If your splint gets wet for any reason please contact the office immediately. You may shower in your splint or cast as long as you keep it dry.  This can be done by wrapping in a cast cover or garbage back (or  similar)  Do Not stick any thing down your splint or cast such as pencils, money, or hangers to try and scratch yourself with.  If you feel itchy take benadryl as prescribed on the bottle for itching  IF YOU ARE IN A CAM BOOT (BLACK BOOT)  You may remove boot periodically. Perform daily dressing changes as noted below.  Wash the liner of the boot regularly and wear a sock when wearing the boot. It is recommended that you sleep in the boot until told otherwise  CALL THE OFFICE WITH ANY QUESTIONS OR CONCERNS: [phone]

## 2017-03-31 NOTE — Progress Notes (Signed)
Inpatient Diabetes Program Recommendations  AACE/ADA: New Consensus Statement on Inpatient Glycemic Control (2015)  Target Ranges:  Prepandial:   less than 140 mg/dL      Peak postprandial:   less than 180 mg/dL (1-2 hours)      Critically ill patients:  140 - 180 mg/dL   Lab Results  Component Value Date   GLUCAP 172 (H) 03/31/2017   HGBA1C 6.5 (H) 03/29/2017    Review of Glycemic Control  Results for Barbara Matthews, Barbara Matthews (MRN [id number]) as of 03/31/2017 11:45  Ref. Range 03/30/2017 12:08 03/30/2017 17:26 03/30/2017 20:56 03/31/2017 06:30 03/31/2017 11:12  Glucose-Capillary Latest Ref Range: 65 - 99 mg/dL 259 (H) 215 (H) 209 (H) 113 (H) 172 (H)    Diabetes history: Type 2 Outpatient Diabetes medications: none Current orders for Inpatient glycemic control: Novolog 0-9 units tid  Inpatient Diabetes Program Recommendations:  Consider starting Novolog 2 units tid (hold if patient eats less than 50%)- discontinue when steroids are stopped.    Continue Novolog 0-9 units tid as ordered.   Gentry Fitz, RN, BA, MHA, CDE Diabetes Coordinator Inpatient Diabetes Program  [phone] (Team Pager) [phone] ([place]) 03/31/2017 11:48 AM

## 2017-03-31 NOTE — Care Management Note (Signed)
Case Management Note  Patient Details  Name: Eulonda Andalon MRN: [id number] Date of Birth: 11/22/1955  Subjective/Objective:                    Action/Plan:  Case manager faxed demographics to [facility] with [place] for [place] for Home use. Called Michael with [employer] with request for CPM to be arranged for delivery to patient.   Expected Discharge Date:     04/01/17             Expected Discharge Plan:  Home/Self Care  In-House Referral:  NA  Discharge planning Services  CM Consult  Post Acute Care Choice:  Durable Medical Equipment Choice offered to:  NA  DME Arranged:  CPM DME Agency:  [employer]  HH Arranged:  NA HH Agency:  [facility] (formerly [employer])  Status of Service:  Completed, signed off  If discussed at [employer], dates discussed:    Additional Comments:  Ninfa Meeker, RN 03/31/2017, 3:44 PM

## 2017-03-31 NOTE — Discharge Summary (Signed)
Orthopaedic Trauma Service (OTS)  Patient ID: Barbara Matthews MRN: [id number] DOB/AGE: 1955-10-28 61 y.o.  Admit date: 03/29/2017 Discharge date: 03/31/2017  Admission Diagnoses: Arthrofibrosis left knee Left knee contracture Type 2 diabetes Hypertension Colostomy Anemia of chronic disease Active Port-A-Cath History of left distal femur fracture  Discharge Diagnoses:  Principal Problem:   Arthrofibrosis of knee joint, left Active Problems:   Essential hypertension   Diabetes mellitus type 2, uncontrolled, with complications ([place])   Colostomy care ([place])   Anemia of chronic disease   Contracture of left knee   Port-a-cath in place   Procedures Performed: 03/29/2017- Dr. Marcelino Scot  1. Left knee capsulectomy. 2. Quadricepsplasty. 3. Manipulation of left knee.  Discharged Condition: good  Hospital Course:   Patient is a 61 year old female with complex medical history including ileostomy for rectal cancer, type 2 diabetes, metastatic lung cancer who sustained a closed leftdistal femur fracture back in February 2018. Patient was treated with surgical intervention. She has a united her fracture however has developed arthrofibrosis of her left knee. She was admitted on 03/29/2017 for surgical intervention to address her knee contracture. She underwent the procedures noted above. After surgery she was admitted for pain control as well as to begin CPM use. Patient did not have any perioperative issues. She remained in hospital for 2 postoperative days for therapy. On postoperative day #2 patient was deemed be stable for discharge to home. At the time of discharge were attempting to arrange for home CPM. She has a home exercise program regarding her unrestricted range of motion of her left knee. Patient discharged in stable condition. Patient was covered with Lovenox for DVT and PE prophylaxis during hospital stay. She will not be discharged on any pharmacologic agents as she is  ambulatory.  Consults: None  Significant Diagnostic Studies: labs:   Results for Barbara Matthews, Barbara Matthews (MRN [id number]) as of 03/31/2017 16:04  Ref. Range 03/31/2017 04:28  Sodium Latest Ref Range: 135 - 145 mmol/L 140  Potassium Latest Ref Range: 3.5 - 5.1 mmol/L 3.5  Chloride Latest Ref Range: 101 - 111 mmol/L 105  CO2 Latest Ref Range: 22 - 32 mmol/L 31  Glucose Latest Ref Range: 65 - 99 mg/dL 136 (H)  BUN Latest Ref Range: 6 - 20 mg/dL 12  Creatinine Latest Ref Range: 0.44 - 1.00 mg/dL 0.59  Calcium Latest Ref Range: 8.9 - 10.3 mg/dL 9.1  Anion gap Latest Ref Range: 5 - 15  4 (L)  GFR, Est African American Latest Ref Range: >60 mL/min >60  GFR, Est Non African American Latest Ref Range: >60 mL/min >60  WBC Latest Ref Range: 4.0 - 10.5 K/uL 7.3  RBC Latest Ref Range: 3.87 - 5.11 MIL/uL 3.40 (L)  Hemoglobin Latest Ref Range: 12.0 - 15.0 g/dL 8.5 (L)  HCT Latest Ref Range: 36.0 - 46.0 % 27.5 (L)  MCV Latest Ref Range: 78.0 - 100.0 fL 80.9  MCH Latest Ref Range: 26.0 - 34.0 pg 25.0 (L)  MCHC Latest Ref Range: 30.0 - 36.0 g/dL 30.9  RDW Latest Ref Range: 11.5 - 15.5 % 17.7 (H)  Platelets Latest Ref Range: 150 - 400 K/uL 214    Treatments: IV hydration, antibiotics: Ancef, analgesia:femoral nerve block, Norco, cardiac meds: metoprolol, anticoagulation: LMW heparin, insulin: novoLog, therapies: PT, OT and RN and surgery: as above  Discharge Exam:          Orthopedic Trauma Service Progress Note    Patient ID: Barbara Matthews MRN: [id number] DOB/AGE: 04-Aug-1956 61 y.o.   Subjective:  Doing very well No complaints Ready to go home    ROS As above   Objective:    VITALS:         Vitals:    03/30/17 1838 03/30/17 2044 03/31/17 0636 03/31/17 1300  BP: (!) 157/62 (!) 143/65 (!) 170/61 (!) 121/58  Pulse: 84 79 85 79  Resp:   17 17 17   Temp: 98.7 F (37.1 C) 98.1 F (36.7 C) 98.1 F (36.7 C) 98.4 F (36.9 C)  TempSrc: Oral Oral Oral Oral  SpO2: 99% 98% 98% 97%  Weight:               Intake/Output      08/22 0701 - 08/23 0700 08/23 0701 - 08/24 0700   P.O.  360   Total Intake(mL/kg)  360 (5.6)   Drains 25    Total Output 25     Net -25 +360           LABS   Lab Results Last 24 Hours       Results for orders placed or performed during the hospital encounter of 03/29/17 (from the past 24 hour(s))  Glucose, capillary     Status: Abnormal    Collection Time: 03/30/17  5:26 PM  Result Value Ref Range    Glucose-Capillary 215 (H) 65 - 99 mg/dL  Glucose, capillary     Status: Abnormal    Collection Time: 03/30/17  8:56 PM  Result Value Ref Range    Glucose-Capillary 209 (H) 65 - 99 mg/dL  CBC     Status: Abnormal    Collection Time: 03/31/17  4:28 AM  Result Value Ref Range    WBC 7.3 4.0 - 10.5 K/uL    RBC 3.40 (L) 3.87 - 5.11 MIL/uL    Hemoglobin 8.5 (L) 12.0 - 15.0 g/dL    HCT 27.5 (L) 36.0 - 46.0 %    MCV 80.9 78.0 - 100.0 fL    MCH 25.0 (L) 26.0 - 34.0 pg    MCHC 30.9 30.0 - 36.0 g/dL    RDW 17.7 (H) 11.5 - 15.5 %    Platelets 214 150 - 400 K/uL  Basic metabolic panel     Status: Abnormal    Collection Time: 03/31/17  4:28 AM  Result Value Ref Range    Sodium 140 135 - 145 mmol/L    Potassium 3.5 3.5 - 5.1 mmol/L    Chloride 105 101 - 111 mmol/L    CO2 31 22 - 32 mmol/L    Glucose, Bld 136 (H) 65 - 99 mg/dL    BUN 12 6 - 20 mg/dL    Creatinine, Ser 0.59 0.44 - 1.00 mg/dL    Calcium 9.1 8.9 - 10.3 mg/dL    GFR calc non Af Amer >60 >60 mL/min    GFR calc Af Amer >60 >60 mL/min    Anion gap 4 (L) 5 - 15  Glucose, capillary     Status: Abnormal    Collection Time: 03/31/17  6:30 AM  Result Value Ref Range    Glucose-Capillary 113 (H) 65 - 99 mg/dL  Glucose, capillary     Status: Abnormal    Collection Time: 03/31/17 11:12 AM  Result Value Ref Range    Glucose-Capillary 172 (H) 65 - 99 mg/dL          PHYSICAL EXAM:    Gen: resting comfortably in bedside chair, NAD Lungs: breathing unlabored Cardiac: RRR Ext:       Left  Lower  Extremity              Dressing removed             Incision looks fantastic, no active drainage             Swelling well controlled             Ext warm              Drain removed by myself today             Distal motor and sensory functions intact             No DCT             Compartments are soft             ROM 0-60 degrees at left knee             + DP pulse        Assessment/Plan: 2 Days Post-Op    Principal Problem:   Arthrofibrosis of knee joint, left Active Problems:   Essential hypertension   Diabetes mellitus type 2, uncontrolled, with complications ([facility])   Colostomy care ([place])   Anemia of chronic disease   Contracture of left knee   Port-a-cath in place               Anti-infectives     Start     Dose/Rate Route Frequency Ordered Stop    03/29/17 2130   ceFAZolin (ANCEF) IVPB 1 g/50 mL premix     1 g 100 mL/hr over 30 Minutes Intravenous Every 6 hours 03/29/17 2047 03/30/17 0410    03/29/17 0915   ceFAZolin (ANCEF) IVPB 2g/100 mL premix     2 g 200 mL/hr over 30 Minutes Intravenous On call to O.R. 03/29/17 3976 03/29/17 1503     .   POD/HD#: 2   61 y/o female s/p L knee capsulotomy, quadricepsplasty, MUA of L knee for L knee contracture s/p ORIF L distal femur   -L knee contracture s/p L knee capsulotomy, quadricepsplasty, MUA of L knee             CPM for 4-6 hrs/day, continue to increase by 5-10 degrees/day             WBAT L leg             PT/OT              Ice prn             Dressing changed             Change dressing on Saturday                          Reviewed wound care with family                          Ok to shower              Outpatient PT at dc to help work on ROM             spoke to [employer] about home home CPM                         Will try to obtain      - Medical issues  medical issues stable               DM                        home meds                HTN                         home meds    -  DVT/PE prophylaxis:             no pharmacologics at dc    - ID:              periop abx completed      - Dispo:            dc home              Follow up in 2 weeks with ortho    Disposition: 06-Home-Health Care Svc  Discharge Instructions    CPM    Complete by:  As directed    Continuous passive motion machine (CPM):      Use the CPM from 0 to 90 degrees for 6 hours per day.      You may increase by 5 degrees per day.  You may break it up into 2 or 3 sessions per day.      Use CPM for 4 weeks or until you are told to stop.   Call MD / Call 911    Complete by:  As directed    If you experience chest pain or shortness of breath, CALL 911 and be transported to the hospital emergency room.  If you develope a fever above 101 F, pus (white drainage) or increased drainage or redness at the wound, or calf pain, call your surgeon's office.   Constipation Prevention    Complete by:  As directed    Drink plenty of fluids.  Prune juice may be helpful.  You may use a stool softener, such as Colace (over the counter) 100 mg twice a day.  Use MiraLax (over the counter) for constipation as needed.   Diet Carb Modified    Complete by:  As directed    Discharge instructions    Complete by:  As directed    Orthopaedic Trauma Service Discharge Instructions   General Discharge Instructions  WEIGHT BEARING STATUS: weight bearing as tolerated left leg  RANGE OF MOTION/ACTIVITY:range of motion as tolerated left knee. Active and passive motion.Utilize continuous passive motion machine for 6 hours a day. 0-90. Okay to breakup sessions into 2 or 3 hour increments. Ice after sessions  Wound Care: daily wound care starting on 04/02/2017. Please see instructions below  Discharge Wound Care Instructions  Do NOT apply any ointments, solutions or lotions to pin sites or surgical wounds.  These prevent needed drainage and even though solutions like hydrogen peroxide kill bacteria, they also damage cells  lining the pin sites that help fight infection.  Applying lotions or ointments can keep the wounds moist and can cause them to breakdown and open up as well. This can increase the risk for infection. When in doubt call the office.  Surgical incisions should be dressed daily.  If any drainage is noted, use one layer of adaptic, then gauze, Kerlix, and an ace wrap.  Once the incision is completely dry and without drainage, it may be left open to air out.  Showering may begin 36-48 hours later.  Cleaning gently with soap and water.  Traumatic wounds should be dressed daily as well.    One layer of adaptic, gauze, Kerlix, then ace wrap.  The adaptic can be discontinued once the draining has ceased    If you have a wet to dry dressing: wet the gauze with saline the squeeze as much saline out so the gauze is moist (not soaking wet), place moistened gauze over wound, then place a dry gauze over the moist one, followed by Kerlix wrap, then ace wrap.  Diet: as you were eating previously.  Can use over the counter stool softeners and bowel preparations, such as Miralax, to help with bowel movements.  Narcotics can be constipating.  Be sure to drink plenty of fluids  PAIN MEDICATION USE AND EXPECTATIONS  You have likely been given narcotic medications to help control your pain.  After a traumatic event that results in an fracture (broken bone) with or without surgery, it is ok to use narcotic pain medications to help control one's pain.  We understand that everyone responds to pain differently and each individual patient will be evaluated on a regular basis for the continued need for narcotic medications. Ideally, narcotic medication use should last no more than 6-8 weeks (coinciding with fracture healing).   As a patient it is your responsibility as well to monitor narcotic medication use and report the amount and frequency you use these medications when you come to your office visit.   We would also advise  that if you are using narcotic medications, you should take a dose prior to therapy to maximize you participation.  IF YOU ARE ON NARCOTIC MEDICATIONS IT IS NOT PERMISSIBLE TO OPERATE A MOTOR VEHICLE (MOTORCYCLE/CAR/TRUCK/MOPED) OR HEAVY MACHINERY DO NOT MIX NARCOTICS WITH OTHER CNS (CENTRAL NERVOUS SYSTEM) DEPRESSANTS SUCH AS ALCOHOL   STOP SMOKING OR USING NICOTINE PRODUCTS!!!!  As discussed nicotine severely impairs your body's ability to heal surgical and traumatic wounds but also impairs bone healing.  Wounds and bone heal by forming microscopic blood vessels (angiogenesis) and nicotine is a vasoconstrictor (essentially, shrinks blood vessels).  Therefore, if vasoconstriction occurs to these microscopic blood vessels they essentially disappear and are unable to deliver necessary nutrients to the healing tissue.  This is one modifiable factor that you can do to dramatically increase your chances of healing your injury.    (This means no smoking, no nicotine gum, patches, etc)  DO NOT USE NONSTEROIDAL ANTI-INFLAMMATORY DRUGS (NSAID'S)  Using products such as Advil (ibuprofen), Aleve (naproxen), Motrin (ibuprofen) for additional pain control during fracture healing can delay and/or prevent the healing response.  If you would like to take over the counter (OTC) medication, Tylenol (acetaminophen) is ok.  However, some narcotic medications that are given for pain control contain acetaminophen as well. Therefore, you should not exceed more than 4000 mg of tylenol in a day if you do not have liver disease.  Also note that there are may OTC medicines, such as cold medicines and allergy medicines that my contain tylenol as well.  If you have any questions about medications and/or interactions please ask your doctor/PA or your pharmacist.      ICE AND ELEVATE INJURED/OPERATIVE EXTREMITY  Using ice and elevating the injured extremity above your heart can help with swelling and pain control.  Icing in a  pulsatile fashion, such as 20 minutes on and 20 minutes off, can be followed.    Do not place ice directly  on skin. Make sure there is a barrier between to skin and the ice pack.    Using frozen items such as frozen peas works well as the conform nicely to the are that needs to be iced.  USE AN ACE WRAP OR TED HOSE FOR SWELLING CONTROL  In addition to icing and elevation, Ace wraps or TED hose are used to help limit and resolve swelling.  It is recommended to use Ace wraps or TED hose until you are informed to stop.    When using Ace Wraps start the wrapping distally (farthest away from the body) and wrap proximally (closer to the body)   Example: If you had surgery on your leg or thing and you do not have a splint on, start the ace wrap at the toes and work your way up to the thigh        If you had surgery on your upper extremity and do not have a splint on, start the ace wrap at your fingers and work your way up to the upper arm  IF YOU ARE IN A SPLINT OR CAST DO NOT [place]   If your splint gets wet for any reason please contact the office immediately. You may shower in your splint or cast as long as you keep it dry.  This can be done by wrapping in a cast cover or garbage back (or similar)  Do Not stick any thing down your splint or cast such as pencils, money, or hangers to try and scratch yourself with.  If you feel itchy take benadryl as prescribed on the bottle for itching  IF YOU ARE IN A CAM BOOT (BLACK BOOT)  You may remove boot periodically. Perform daily dressing changes as noted below.  Wash the liner of the boot regularly and wear a sock when wearing the boot. It is recommended that you sleep in the boot until told otherwise  CALL THE OFFICE WITH ANY QUESTIONS OR CONCERNS: [phone]   Do not put a pillow under the knee. Place it under the heel.    Complete by:  As directed    Increase activity slowly as tolerated    Complete by:  As directed    Weight bearing  as tolerated    Complete by:  As directed    Laterality:  left   Extremity:  Lower     Allergies as of 03/31/2017   No Known Allergies     Medication List    TAKE these medications   dexamethasone 4 MG tablet Commonly known as:  DECADRON Take 4 mg by mouth daily.   diphenoxylate-atropine 2.5-0.025 MG tablet Commonly known as:  LOMOTIL Take 1 tablet by mouth 4 (four) times daily as needed for diarrhea or loose stools.   glipiZIDE 10 MG tablet Commonly known as:  GLUCOTROL Take 2 tablets (20 mg total) by mouth daily before breakfast.   HYDROcodone-acetaminophen 7.5-325 MG tablet Commonly known as:  NORCO Take 1-2 tablets by mouth every 6 (six) hours as needed (breakthrough pain).   LevETIRAcetam 750 MG Tb3d Take 1,500 mg by mouth 2 (two) times daily.   loperamide 2 MG capsule Commonly known as:  IMODIUM Take 1 capsule by mouth 2 (two) times daily.   magnesium oxide 400 MG tablet Commonly known as:  MAG-OX Take 400 mg by mouth 2 (two) times daily.   methocarbamol 500 MG tablet Commonly known as:  ROBAXIN Take 1 tablet (500 mg total) by mouth every  6 (six) hours as needed for muscle spasms.   metoprolol tartrate 25 MG tablet Commonly known as:  LOPRESSOR Take 12.5 mg by mouth 2 (two) times daily.   sodium bicarbonate 650 MG tablet Take 1 tablet (650 mg total) by mouth 3 (three) times daily. What changed:  when to take this   vitamin C 250 MG tablet Commonly known as:  ASCORBIC ACID Take 250 mg by mouth 2 (two) times daily.            Durable Medical Equipment        Start     Ordered   03/30/17 [phone]  For home use only DME Continuous passive motion machine  Once    Comments:  Left knee CPM  0-90 degrees Increase by 5-10 degrees per day if less than 90 degrees 4-6 hours a day. Can split into 2-3 sessions   03/30/17 0905       Discharge Care Instructions        Start     Ordered   03/31/17 0000  HYDROcodone-acetaminophen (NORCO) 7.5-325 MG  tablet  Every 6 hours PRN    Question:  Supervising Provider  Answer:  HANDY, MICHAEL   03/31/17 1600   03/31/17 0000  methocarbamol (ROBAXIN) 500 MG tablet  Every 6 hours PRN    Question:  Supervising Provider  Answer:  HANDY, MICHAEL   03/31/17 1600   03/31/17 0000  Call MD / Call 911    Comments:  If you experience chest pain or shortness of breath, CALL 911 and be transported to the hospital emergency room.  If you develope a fever above 101 F, pus (white drainage) or increased drainage or redness at the wound, or calf pain, call your surgeon's office.   03/31/17 1600   03/31/17 0000  Constipation Prevention    Comments:  Drink plenty of fluids.  Prune juice may be helpful.  You may use a stool softener, such as Colace (over the counter) 100 mg twice a day.  Use MiraLax (over the counter) for constipation as needed.   03/31/17 1600   03/31/17 0000  Increase activity slowly as tolerated     03/31/17 1600   03/31/17 0000  Discharge instructions    Comments:  Orthopaedic Trauma Service Discharge Instructions   General Discharge Instructions  WEIGHT BEARING STATUS: weight bearing as tolerated left leg  RANGE OF MOTION/ACTIVITY:range of motion as tolerated left knee. Active and passive motion.Utilize continuous passive motion machine for 6 hours a day. 0-90. Okay to breakup sessions into 2 or 3 hour increments. Ice after sessions  Wound Care: daily wound care starting on 04/02/2017. Please see instructions below  Discharge Wound Care Instructions  Do NOT apply any ointments, solutions or lotions to pin sites or surgical wounds.  These prevent needed drainage and even though solutions like hydrogen peroxide kill bacteria, they also damage cells lining the pin sites that help fight infection.  Applying lotions or ointments can keep the wounds moist and can cause them to breakdown and open up as well. This can increase the risk for infection. When in doubt call the office.  Surgical  incisions should be dressed daily.  If any drainage is noted, use one layer of adaptic, then gauze, Kerlix, and an ace wrap.  Once the incision is completely dry and without drainage, it may be left open to air out.  Showering may begin 36-48 hours later.  Cleaning gently with soap and water.  Traumatic wounds should be  dressed daily as well.    One layer of adaptic, gauze, Kerlix, then ace wrap.  The adaptic can be discontinued once the draining has ceased    If you have a wet to dry dressing: wet the gauze with saline the squeeze as much saline out so the gauze is moist (not soaking wet), place moistened gauze over wound, then place a dry gauze over the moist one, followed by Kerlix wrap, then ace wrap.  Diet: as you were eating previously.  Can use over the counter stool softeners and bowel preparations, such as Miralax, to help with bowel movements.  Narcotics can be constipating.  Be sure to drink plenty of fluids  PAIN MEDICATION USE AND EXPECTATIONS  You have likely been given narcotic medications to help control your pain.  After a traumatic event that results in an fracture (broken bone) with or without surgery, it is ok to use narcotic pain medications to help control one's pain.  We understand that everyone responds to pain differently and each individual patient will be evaluated on a regular basis for the continued need for narcotic medications. Ideally, narcotic medication use should last no more than 6-8 weeks (coinciding with fracture healing).   As a patient it is your responsibility as well to monitor narcotic medication use and report the amount and frequency you use these medications when you come to your office visit.   We would also advise that if you are using narcotic medications, you should take a dose prior to therapy to maximize you participation.  IF YOU ARE ON NARCOTIC MEDICATIONS IT IS NOT PERMISSIBLE TO OPERATE A MOTOR VEHICLE (MOTORCYCLE/CAR/TRUCK/MOPED) OR HEAVY  MACHINERY DO NOT MIX NARCOTICS WITH OTHER CNS (CENTRAL NERVOUS SYSTEM) DEPRESSANTS SUCH AS ALCOHOL   STOP SMOKING OR USING NICOTINE PRODUCTS!!!!  As discussed nicotine severely impairs your body's ability to heal surgical and traumatic wounds but also impairs bone healing.  Wounds and bone heal by forming microscopic blood vessels (angiogenesis) and nicotine is a vasoconstrictor (essentially, shrinks blood vessels).  Therefore, if vasoconstriction occurs to these microscopic blood vessels they essentially disappear and are unable to deliver necessary nutrients to the healing tissue.  This is one modifiable factor that you can do to dramatically increase your chances of healing your injury.    (This means no smoking, no nicotine gum, patches, etc)  DO NOT USE NONSTEROIDAL ANTI-INFLAMMATORY DRUGS (NSAID'S)  Using products such as Advil (ibuprofen), Aleve (naproxen), Motrin (ibuprofen) for additional pain control during fracture healing can delay and/or prevent the healing response.  If you would like to take over the counter (OTC) medication, Tylenol (acetaminophen) is ok.  However, some narcotic medications that are given for pain control contain acetaminophen as well. Therefore, you should not exceed more than 4000 mg of tylenol in a day if you do not have liver disease.  Also note that there are may OTC medicines, such as cold medicines and allergy medicines that my contain tylenol as well.  If you have any questions about medications and/or interactions please ask your doctor/PA or your pharmacist.      ICE AND ELEVATE INJURED/OPERATIVE EXTREMITY  Using ice and elevating the injured extremity above your heart can help with swelling and pain control.  Icing in a pulsatile fashion, such as 20 minutes on and 20 minutes off, can be followed.    Do not place ice directly on skin. Make sure there is a barrier between to skin and the ice pack.    Using  frozen items such as frozen peas works well as the  conform nicely to the are that needs to be iced.  USE AN ACE WRAP OR TED HOSE FOR SWELLING CONTROL  In addition to icing and elevation, Ace wraps or TED hose are used to help limit and resolve swelling.  It is recommended to use Ace wraps or TED hose until you are informed to stop.    When using Ace Wraps start the wrapping distally (farthest away from the body) and wrap proximally (closer to the body)   Example: If you had surgery on your leg or thing and you do not have a splint on, start the ace wrap at the toes and work your way up to the thigh        If you had surgery on your upper extremity and do not have a splint on, start the ace wrap at your fingers and work your way up to the upper arm  IF YOU ARE IN A SPLINT OR CAST DO NOT [place]   If your splint gets wet for any reason please contact the office immediately. You may shower in your splint or cast as long as you keep it dry.  This can be done by wrapping in a cast cover or garbage back (or similar)  Do Not stick any thing down your splint or cast such as pencils, money, or hangers to try and scratch yourself with.  If you feel itchy take benadryl as prescribed on the bottle for itching  IF YOU ARE IN A CAM BOOT (BLACK BOOT)  You may remove boot periodically. Perform daily dressing changes as noted below.  Wash the liner of the boot regularly and wear a sock when wearing the boot. It is recommended that you sleep in the boot until told otherwise  CALL THE OFFICE WITH ANY QUESTIONS OR CONCERNS: [phone]   03/31/17 1600   03/31/17 0000  Diet Carb Modified     03/31/17 1600   03/31/17 0000  Weight bearing as tolerated    Question Answer Comment  Laterality left   Extremity Lower      03/31/17 1600   03/31/17 0000  CPM    Comments:  Continuous passive motion machine (CPM):      Use the CPM from 0 to 90 degrees for 6 hours per day.      You may increase by 5 degrees per day.  You may break it up into 2 or 3  sessions per day.      Use CPM for 4 weeks or until you are told to stop.   03/31/17 1600   03/31/17 0000  Do not put a pillow under the knee. Place it under the heel.     03/31/17 1600     Follow-up Information    Altamese Freelandville, MD. Schedule an appointment as soon as possible for a visit in 2 week(s).   Specialty:  Orthopedic Surgery Contact information: [address]  [address] [phone]           Discharge Instructions and Plan:  61 y/o female s/p L knee capsulotomy, quadricepsplasty, MUA of L knee for L knee contracture s/p ORIF L distal femur   -L knee contracture s/p L knee capsulotomy, quadricepsplasty, MUA of L knee             CPM for 4-6 hrs/day, continue to increase by 5-10 degrees/day  WBAT L leg             PT/OT              Ice prn             Dressing changed             Change dressing on Saturday                          Reviewed wound care with family                          Ok to shower              Outpatient PT at dc to help work on ROM             spoke to [facility] about home home CPM                         Will try to obtain      - Medical issues              medical issues stable               DM                        home meds                HTN                         home meds    - DVT/PE prophylaxis:             no pharmacologics at dc    - ID:              periop abx completed      - Dispo:            dc home              Follow up in 2 weeks with ortho   Signed:  Jari Pigg, PA-C [facility] [phone] (P) 03/31/2017, 4:01 PM

## 2017-03-31 NOTE — Progress Notes (Signed)
Orthopedic Trauma Service Progress Note   Patient ID: Barbara Matthews MRN: [id number] DOB/AGE: 05-05-1956 61 y.o.  Subjective:  Doing very well No complaints Ready to go home   ROS As above  Objective:   VITALS:   Vitals:   03/30/17 1838 03/30/17 2044 03/31/17 0636 03/31/17 1300  BP: (!) 157/62 (!) 143/65 (!) 170/61 (!) 121/58  Pulse: 84 79 85 79  Resp:  17 17 17   Temp: 98.7 F (37.1 C) 98.1 F (36.7 C) 98.1 F (36.7 C) 98.4 F (36.9 C)  TempSrc: Oral Oral Oral Oral  SpO2: 99% 98% 98% 97%  Weight:        Intake/Output      08/22 0701 - 08/23 0700 08/23 0701 - 08/24 0700   P.O.  360   Total Intake(mL/kg)  360 (5.6)   Drains 25    Total Output 25     Net -25 +360          LABS  Results for orders placed or performed during the hospital encounter of 03/29/17 (from the past 24 hour(s))  Glucose, capillary     Status: Abnormal   Collection Time: 03/30/17  5:26 PM  Result Value Ref Range   Glucose-Capillary 215 (H) 65 - 99 mg/dL  Glucose, capillary     Status: Abnormal   Collection Time: 03/30/17  8:56 PM  Result Value Ref Range   Glucose-Capillary 209 (H) 65 - 99 mg/dL  CBC     Status: Abnormal   Collection Time: 03/31/17  4:28 AM  Result Value Ref Range   WBC 7.3 4.0 - 10.5 K/uL   RBC 3.40 (L) 3.87 - 5.11 MIL/uL   Hemoglobin 8.5 (L) 12.0 - 15.0 g/dL   HCT 27.5 (L) 36.0 - 46.0 %   MCV 80.9 78.0 - 100.0 fL   MCH 25.0 (L) 26.0 - 34.0 pg   MCHC 30.9 30.0 - 36.0 g/dL   RDW 17.7 (H) 11.5 - 15.5 %   Platelets 214 150 - 400 K/uL  Basic metabolic panel     Status: Abnormal   Collection Time: 03/31/17  4:28 AM  Result Value Ref Range   Sodium 140 135 - 145 mmol/L   Potassium 3.5 3.5 - 5.1 mmol/L   Chloride 105 101 - 111 mmol/L   CO2 31 22 - 32 mmol/L   Glucose, Bld 136 (H) 65 - 99 mg/dL   BUN 12 6 - 20 mg/dL   Creatinine, Ser 0.59 0.44 - 1.00 mg/dL   Calcium 9.1 8.9 - 10.3 mg/dL   GFR calc non Af Amer >60 >60 mL/min   GFR calc Af  Amer >60 >60 mL/min   Anion gap 4 (L) 5 - 15  Glucose, capillary     Status: Abnormal   Collection Time: 03/31/17  6:30 AM  Result Value Ref Range   Glucose-Capillary 113 (H) 65 - 99 mg/dL  Glucose, capillary     Status: Abnormal   Collection Time: 03/31/17 11:12 AM  Result Value Ref Range   Glucose-Capillary 172 (H) 65 - 99 mg/dL     PHYSICAL EXAM:   Gen: resting comfortably in bedside chair, NAD Lungs: breathing unlabored Cardiac: RRR Ext:       Left Lower Extremity   Dressing removed  Incision looks fantastic, no active drainage  Swelling well controlled  Ext warm   Drain removed by myself today  Distal motor and sensory functions intact  No DCT  Compartments are soft  ROM 0-60 degrees at  left knee  + DP pulse     Assessment/Plan: 2 Days Post-Op   Principal Problem:   Arthrofibrosis of knee joint, left Active Problems:   Essential hypertension   Diabetes mellitus type 2, uncontrolled, with complications (HCC)   Colostomy care ([place])   Anemia of chronic disease   Contracture of left knee   Port-a-cath in place   Anti-infectives    Start     Dose/Rate Route Frequency Ordered Stop   03/29/17 2130  ceFAZolin (ANCEF) IVPB 1 g/50 mL premix     1 g 100 mL/hr over 30 Minutes Intravenous Every 6 hours 03/29/17 2047 03/30/17 0410   03/29/17 0915  ceFAZolin (ANCEF) IVPB 2g/100 mL premix     2 g 200 mL/hr over 30 Minutes Intravenous On call to O.R. 03/29/17 6286 03/29/17 1503    .  POD/HD#: 2  61 y/o female s/p L knee capsulotomy, quadricepsplasty, MUA of L knee for L knee contracture s/p ORIF L distal femur   -L knee contracture s/p L knee capsulotomy, quadricepsplasty, MUA of L knee             CPM for 4-6 hrs/day, continue to increase by 5-10 degrees/day             WBAT L leg             PT/OT              Ice prn             Dressing changed  Change dressing on Saturday    Reviewed wound care with family    Ok to shower              Outpatient PT at  dc to help work on ROM             spoke to [employer] about home home CPM   Will try to obtain      - Medical issues              medical issues stable               DM                        home meds                HTN                         home meds    - DVT/PE prophylaxis:             no pharmacologics at dc    - ID:              periop abx completed      - Dispo:            dc home   Follow up in 2 weeks with ortho     Jari Pigg, PA-C [facility] [phone] (P) [phone] (O) 03/31/2017, 3:38 PM

## 2017-05-12 ENCOUNTER — Emergency Department (HOSPITAL_BASED_OUTPATIENT_CLINIC_OR_DEPARTMENT_OTHER)
Admission: EM | Admit: 2017-05-12 | Discharge: 2017-05-12 | Disposition: A | Discharge status: Other Acute Inpatient Hospital | Payer: BC Managed Care – PPO | Attending: Physician Assistant | Admitting: Physician Assistant

## 2017-05-12 ENCOUNTER — Encounter (HOSPITAL_BASED_OUTPATIENT_CLINIC_OR_DEPARTMENT_OTHER): Payer: Self-pay | Admitting: Emergency Medicine

## 2017-05-12 ENCOUNTER — Emergency Department (HOSPITAL_BASED_OUTPATIENT_CLINIC_OR_DEPARTMENT_OTHER): Payer: BC Managed Care – PPO

## 2017-05-12 DIAGNOSIS — Z7984 Long term (current) use of oral hypoglycemic drugs: Secondary | ICD-10-CM | POA: Diagnosis not present

## 2017-05-12 DIAGNOSIS — C785 Secondary malignant neoplasm of large intestine and rectum: Secondary | ICD-10-CM | POA: Diagnosis not present

## 2017-05-12 DIAGNOSIS — E876 Hypokalemia: Secondary | ICD-10-CM | POA: Insufficient documentation

## 2017-05-12 DIAGNOSIS — Z79899 Other long term (current) drug therapy: Secondary | ICD-10-CM | POA: Insufficient documentation

## 2017-05-12 DIAGNOSIS — E11649 Type 2 diabetes mellitus with hypoglycemia without coma: Secondary | ICD-10-CM | POA: Diagnosis not present

## 2017-05-12 DIAGNOSIS — Z87891 Personal history of nicotine dependence: Secondary | ICD-10-CM | POA: Diagnosis not present

## 2017-05-12 DIAGNOSIS — I10 Essential (primary) hypertension: Secondary | ICD-10-CM | POA: Insufficient documentation

## 2017-05-12 DIAGNOSIS — Z95828 Presence of other vascular implants and grafts: Secondary | ICD-10-CM | POA: Diagnosis not present

## 2017-05-12 DIAGNOSIS — Z933 Colostomy status: Secondary | ICD-10-CM | POA: Insufficient documentation

## 2017-05-12 DIAGNOSIS — R42 Dizziness and giddiness: Secondary | ICD-10-CM | POA: Diagnosis present

## 2017-05-12 DIAGNOSIS — E162 Hypoglycemia, unspecified: Secondary | ICD-10-CM

## 2017-05-12 LAB — CBG MONITORING, ED
GLUCOSE-CAPILLARY: 40 mg/dL — AB (ref 65–99)
Glucose-Capillary: 179 mg/dL — ABNORMAL HIGH (ref 65–99)
Glucose-Capillary: 35 mg/dL — CL (ref 65–99)
Glucose-Capillary: 129 mg/dL — ABNORMAL HIGH (ref 65–99)
Glucose-Capillary: 97 mg/dL (ref 65–99)
Glucose-Capillary: 108 mg/dL — ABNORMAL HIGH (ref 65–99)
Glucose-Capillary: 43 mg/dL — CL (ref 65–99)
Glucose-Capillary: 70 mg/dL (ref 65–99)

## 2017-05-12 LAB — URINALYSIS, ROUTINE W REFLEX MICROSCOPIC
Glucose, UA: NEGATIVE mg/dL
Ketones, ur: 15 mg/dL — AB
Leukocytes, UA: NEGATIVE
NITRITE: NEGATIVE
Protein, ur: >300 mg/dL — AB
pH: 6 (ref 5.0–8.0)

## 2017-05-12 LAB — BASIC METABOLIC PANEL
ANION GAP: 5 (ref 5–15)
BUN: <5 mg/dL — ABNORMAL LOW (ref 6–20)
CALCIUM: 9.3 mg/dL (ref 8.9–10.3)
CO2: 25 mmol/L (ref 22–32)
Chloride: 107 mmol/L (ref 101–111)
Creatinine, Ser: 0.59 mg/dL (ref 0.44–1.00)
Glucose, Bld: 44 mg/dL — CL (ref 65–99)
Potassium: 2.2 mmol/L — CL (ref 3.5–5.1)
Sodium: 137 mmol/L (ref 135–145)

## 2017-05-12 LAB — CBC
HCT: 27.7 % — ABNORMAL LOW (ref 36.0–46.0)
HEMOGLOBIN: 8.9 g/dL — AB (ref 12.0–15.0)
MCH: 24.8 pg — AB (ref 26.0–34.0)
MCHC: 32.1 g/dL (ref 30.0–36.0)
MCV: 77.2 fL — ABNORMAL LOW (ref 78.0–100.0)
Platelets: 254 10*3/uL (ref 150–400)
RBC: 3.59 MIL/uL — ABNORMAL LOW (ref 3.87–5.11)
RDW: 17.3 % — ABNORMAL HIGH (ref 11.5–15.5)
WBC: 20.3 10*3/uL — ABNORMAL HIGH (ref 4.0–10.5)

## 2017-05-12 LAB — URINALYSIS, MICROSCOPIC (REFLEX)

## 2017-05-12 LAB — I-STAT CG4 LACTIC ACID, ED: Lactic Acid, Venous: 1 mmol/L (ref 0.5–1.9)

## 2017-05-12 MED ORDER — DEXTROSE 50 % IV SOLN
INTRAVENOUS | Status: AC
  Filled 2017-05-12: qty 50

## 2017-05-12 MED ORDER — KCL IN DEXTROSE-NACL 20-5-0.45 MEQ/L-%-% IV SOLN
Freq: Once | INTRAVENOUS | Status: AC
  Administered 2017-05-12: 20:00:00 via INTRAVENOUS
  Filled 2017-05-12: qty 1000

## 2017-05-12 MED ORDER — POTASSIUM CHLORIDE 10 MEQ/100ML IV SOLN
10.0000 meq | Freq: Once | INTRAVENOUS | Status: AC
  Administered 2017-05-12: 10 meq via INTRAVENOUS
  Filled 2017-05-12: qty 100

## 2017-05-12 MED ORDER — DEXTROSE 50 % IV SOLN
INTRAVENOUS | Status: AC
  Administered 2017-05-12: 50 mL via INTRAVENOUS
  Filled 2017-05-12: qty 50

## 2017-05-12 MED ORDER — DEXTROSE 50 % IV SOLN
1.0000 | Freq: Once | INTRAVENOUS | Status: AC
  Administered 2017-05-12: 50 mL via INTRAVENOUS

## 2017-05-12 MED ORDER — SODIUM CHLORIDE 0.9% FLUSH
3.0000 mL | Freq: Two times a day (BID) | INTRAVENOUS | Status: DC
  Filled 2017-05-12: qty 3

## 2017-05-12 MED ORDER — SODIUM CHLORIDE 0.9 % IV BOLUS (SEPSIS)
1000.0000 mL | Freq: Once | INTRAVENOUS | Status: AC
  Administered 2017-05-12: 1000 mL via INTRAVENOUS

## 2017-05-12 NOTE — ED Notes (Signed)
Warm compress applied to L A/C and EDP notified of infiltrated line

## 2017-05-12 NOTE — ED Notes (Signed)
Patient transported to X-ray 

## 2017-05-12 NOTE — ED Notes (Signed)
ED Provider at bedside. 

## 2017-05-12 NOTE — ED Triage Notes (Signed)
Pt with hx of lung CA. Had last chemo at [facility] 2 weeks ago. Pt was seen at a dr office today and told to come to ED for evaluation but she cannot remember the reason. Pt reports she had some palpitations and dizziness yesterday.

## 2017-05-12 NOTE — ED Notes (Signed)
Peripheral IV left AC infiltrated. IV fluids changed to Implanted Port left chest.

## 2017-05-12 NOTE — ED Provider Notes (Signed)
[place] DEPT [facility] Provider Note   CSN: [id number] Arrival date & time: 05/12/17  1722     History   Chief Complaint Chief Complaint  Patient presents with  . Dizziness    HPI Barbara Matthews is a 61 y.o. female.  HPI   Patient's a 61 year old female with metastatic rectal cancer. Patient currently on chemotherapy at [facility]. She has ostomy and receives home fluids. Patient reports feelings of palpitations, fast heart rate at home. Patient felt mildly dizzy prior to arrival.  Of note patient's most recent CT scan shows bilateral pulmonary metastatic lesions,cavitated, involving the hilum and directly abutting the pericardium  Patient spoke with her oncologist today who told her to go to [facility]. However she arrived here emergency department.  Of note patient was hyperglycemic to 40 here in the emergency department on arrival. After juice her sugar went down to 36. D50 was pushed.  Patient is on glipizide 10 mg.  She reports she takes 1 to 2 bags of fluid per day to keep up with her ostomy output.  Past Medical History:  Diagnosis Date  . AKI 02/15/2017  . Anemia   . Anxiety   . Arthritis   . Arthrofibrosis of knee joint, left 03/30/2017  . Blood transfusion without reported diagnosis   . Brain lesion 2018   Crainotomy  . Cancer ([place])   . Closed fracture of left distal femur ([place]) 09/12/2016  . Diabetes mellitus without complication ([place])    Type II  . Elevated cholesterol   . Hemorrhoids   . Hypertension   . Pneumonia   . Port-A-Cath in place 03/30/2017  . Rectal cancer ([place])   . SBO (small bowel obstruction) ([place]) 2015   Ileostomy  . Seizures ([place]) 2018   pre Crainotomy- none since  . Status post colostomy [facility])     Patient Active Problem List   Diagnosis Date Noted  . Arthrofibrosis of knee joint, left 03/30/2017  . Port-A-Cath in place 03/30/2017  . Contracture of left knee 03/29/2017  . Hyponatremia   . Hypoalbuminemia due to  protein-calorie malnutrition ([place])   . Anemia of chronic disease   . Fever   . Acute blood loss anemia   . Colostomy care ([place])   . Benign essential HTN   . Type 2 diabetes mellitus with peripheral neuropathy (HCC)   . On total parenteral nutrition (TPN)   . Diabetes mellitus without complication ([place])   . Hypertension   . Multiple trauma 09/12/2016  . Closed fracture of left distal femur ([place]) 09/12/2016  . Sepsis ([place]) 05/05/2015  . HCAP (healthcare-associated pneumonia) 05/05/2015  . Small bowel ischemia ([place]) 05/05/2015  . Hypokalemia 05/05/2015  . Hypomagnesemia 05/05/2015  . Intestinal occlusion (HCC)   . Essential hypertension 05/01/2015  . Diabetes mellitus type 2, uncontrolled, with complications ([place]) 70/62/3762  . Tachycardia 05/01/2015  . Lactic acidosis 05/01/2015  . SBO (small bowel obstruction) ([place]) 04/30/2015  . Anal cancer-adenocarcinoma 02/05/2014  . Mass of anus 10/10/2013    Past Surgical History:  Procedure Laterality Date  . ABDOMINAL HYSTERECTOMY    . COLON SURGERY    . COLONOSCOPY N/A 10/19/2013   Procedure: COLONOSCOPY;  Surgeon: Leighton Ruff, MD;  Location: [facility];  Service: Endoscopy;  Laterality: N/A;  . Crainotomy  01/21/2017  . FEMUR IM NAIL Left 09/13/2016   Procedure: Open Reduction Internal Fixation Left Distal Femur;  Surgeon: Altamese Graeagle, MD;  Location: [place];  Service: Orthopedics;  Laterality: Left;  .  ILEOSTOMY  2015  . ILEOSTOMY  2016  . ILEOSTOMY CLOSURE    . KNEE CLOSED REDUCTION Left 03/29/2017   Procedure: MANIPULATION OF LEFT KNEE;  Surgeon: Altamese Richwood, MD;  Location: [place];  Service: Orthopedics;  Laterality: Left;  . LACERATION REPAIR    . LYSIS OF ADHESION N/A 03/29/2017   Procedure: OPEN LYSIS OF ADHESION;  Surgeon: Altamese [place], MD;  Location: [place];  Service: Orthopedics;  Laterality: N/A;  . PICC LINE PLACE PERIPHERAL ([place] HX)    . PORT A CATH INJECTION ([place] HX)      OB History    No data available        Home Medications    Prior to Admission medications   Medication Sig Start Date End Date Taking? Authorizing Provider  dexamethasone (DECADRON) 4 MG tablet Take 4 mg by mouth daily.    [provider]  diphenoxylate-atropine (LOMOTIL) 2.5-0.025 MG tablet Take 1 tablet by mouth 4 (four) times daily as needed for diarrhea or loose stools.     [provider]  glipiZIDE (GLUCOTROL) 10 MG tablet Take 2 tablets (20 mg total) by mouth daily before breakfast. 10/01/16   Angiulli, Lavon Paganini, PA-C  HYDROcodone-acetaminophen (NORCO) 7.5-325 MG tablet Take 1-2 tablets by mouth every 6 (six) hours as needed (breakthrough pain). 03/31/17   Ainsley Spinner, PA-C  LevETIRAcetam 750 MG TB3D Take 1,500 mg by mouth 2 (two) times daily.    [provider]  loperamide (IMODIUM) 2 MG capsule Take 1 capsule by mouth 2 (two) times daily. 03/14/17   [provider]  magnesium oxide (MAG-OX) 400 MG tablet Take 400 mg by mouth 2 (two) times daily.    [provider]  methocarbamol (ROBAXIN) 500 MG tablet Take 1 tablet (500 mg total) by mouth every 6 (six) hours as needed for muscle spasms. 03/31/17   Ainsley Spinner, PA-C  metoprolol tartrate (LOPRESSOR) 25 MG tablet Take 12.5 mg by mouth 2 (two) times daily.    [provider]  sodium bicarbonate 650 MG tablet Take 1 tablet (650 mg total) by mouth 3 (three) times daily. Patient taking differently: Take 650 mg by mouth 2 (two) times daily.  10/01/16   Angiulli, Lavon Paganini, PA-C  vitamin C (ASCORBIC ACID) 250 MG tablet Take 250 mg by mouth 2 (two) times daily.     [provider]    Family History No family history on file.  Social History Social History  Substance Use Topics  . Smoking status: Former Smoker    Packs/day: 0.50    Years: 34.00    Types: Cigarettes  . Smokeless tobacco: Never Used     Comment: approx 2000  . Alcohol use No     Allergies   Patient has no known allergies.   Review of  Systems Review of Systems  Constitutional: Positive for fatigue. Negative for activity change.  HENT: Negative for drooling.   Respiratory: Negative for cough, chest tightness and shortness of breath.   Cardiovascular: Positive for palpitations. Negative for chest pain.  Gastrointestinal: Negative for abdominal distention.  Genitourinary: Negative for dysuria.  Skin: Negative for rash.  All other systems reviewed and are negative.    Physical Exam Updated Vital Signs BP (!) 148/62   Pulse (!) 122   Temp 98.6 F (37 C) (Oral)   Resp (!) 23   Ht 5\' 6"  (1.676 m)   Wt 63.5 kg (140 lb)   SpO2 100%   BMI 22.60 kg/m  Physical Exam  Constitutional: She is oriented to person, place, and time. She appears well-developed and well-nourished.  HENT:  Head: Normocephalic and atraumatic.  Eyes: Right eye exhibits no discharge. Left eye exhibits no discharge.  Cardiovascular: Normal rate.   Tachycardic  Pulmonary/Chest: Effort normal and breath sounds normal. No respiratory distress.  Abdominal: Soft. She exhibits no distension. There is no tenderness.  Colostomy present with output. No abdominal pain.  Musculoskeletal:  Swelling to left knee and right knee. Left worse than right.  Neurological: She is oriented to person, place, and time. No cranial nerve deficit.  Skin: Skin is warm and dry. She is not diaphoretic.  Psychiatric: She has a normal mood and affect.  Nursing note and vitals reviewed.    ED Treatments / Results  Labs (all labs ordered are listed, but only abnormal results are displayed) Labs Reviewed  CBG MONITORING, ED - Abnormal; Notable for the following:       Result Value   Glucose-Capillary 40 (*)    All other components within normal limits  BASIC METABOLIC PANEL  CBC  URINALYSIS, ROUTINE W REFLEX MICROSCOPIC    EKG  EKG Interpretation None       Radiology No results found.  Procedures Procedures (including critical care time)  CRITICAL  CARE Performed by: Gardiner Sleeper Total critical care time: 80 minutes Critical care time was exclusive of separately billable procedures and treating other patients. Critical care was necessary to treat or prevent imminent or life-threatening deterioration. Critical care was time spent personally by me on the following activities: development of treatment plan with patient and/or surrogate as well as nu rsing, discussions with consultants, evaluation of patient's response to treatment, examination of patient, obtaining history from patient or surrogate, ordering and performing treatments and interventions, ordering and review of laboratory studies, ordering and review of radiographic studies, pulse oximetry and re-evaluation of patient's condition.   Medications Ordered in ED Medications  sodium chloride flush (NS) 0.9 % injection 3 mL (not administered)  dextrose 50 % solution 50 mL (50 mLs Intravenous Given 05/12/17 1805)     Initial Impression / Assessment and Plan / ED Course  I have reviewed the triage vital signs and the nursing notes.  Pertinent labs & imaging results that were available during my care of the patient were reviewed by me and considered in my medical decision making (see chart for details).      Patient's a 61 year old female with metastatic rectal cancer. Patient currently on chemotherapy at [facility]. She has ostomy and receives home fluids. Patient reports feelings of palpitations, fast heart rate at home. Patient felt mildly dizzy prior to arrival.  Of note patient's most recent CT scan shows bilateral pulmonary metastatic lesions,cavitated, involving the hilum and directly abutting the pericardium  Patient spoke with her oncologist today who told her to go to [facility]. However she arrived here emergency department.  Of note patient was hyperglycemic to 40 here in the emergency department on arrival. After juice her sugar went down to 36. D50  was pushed.  Patient is on glipizide 10 mg.  She reports she takes 1 to 2 bags of fluid per day to keep up with her ostomy output.  6:20 PM We'll start broad workup.  After the D50, patient's blood sugar went up to 170. At than continue to drop and over the course an hour dropped down to 40 again. Patient could've another D50 and D5 drip was started.  Patient's heart  rate responded nicely with fluid. The patient will require admission given continually rapid decrease his blood sugar despite no use of insulin. Glipizide is long-acting and therefore will require admission.  7:55 PM Discussed withy Dr. Marcello Moores, will admit to Heme onc A at [facility]     1Final Clinical Impressions(s) / ED Diagnoses   Final diagnoses:  None    New Prescriptions New Prescriptions   No medications on file     Macarthur Critchley, MD 05/12/17 2320

## 2017-05-12 NOTE — ED Notes (Signed)
Pt given orange juice after CBG found to be 41.

## 2017-05-12 NOTE — ED Notes (Signed)
[employer] [facility] at bedside to transport pt.

## 2017-05-12 NOTE — ED Notes (Signed)
Pt was given OJ in triage for hypoglycemia. Pt had only drink about 2 oz at this time.

## 2017-09-09 DEATH — deceased

## 2018-04-23 IMAGING — CR DG CHEST 2V
2 series · 2 of 2 positions shown · non-contrast
Comparison: 03/17/2017, CT chest 08/11/2016

CLINICAL DATA: Palpitation

EXAM:
CHEST  2 VIEW

[w chest pa]
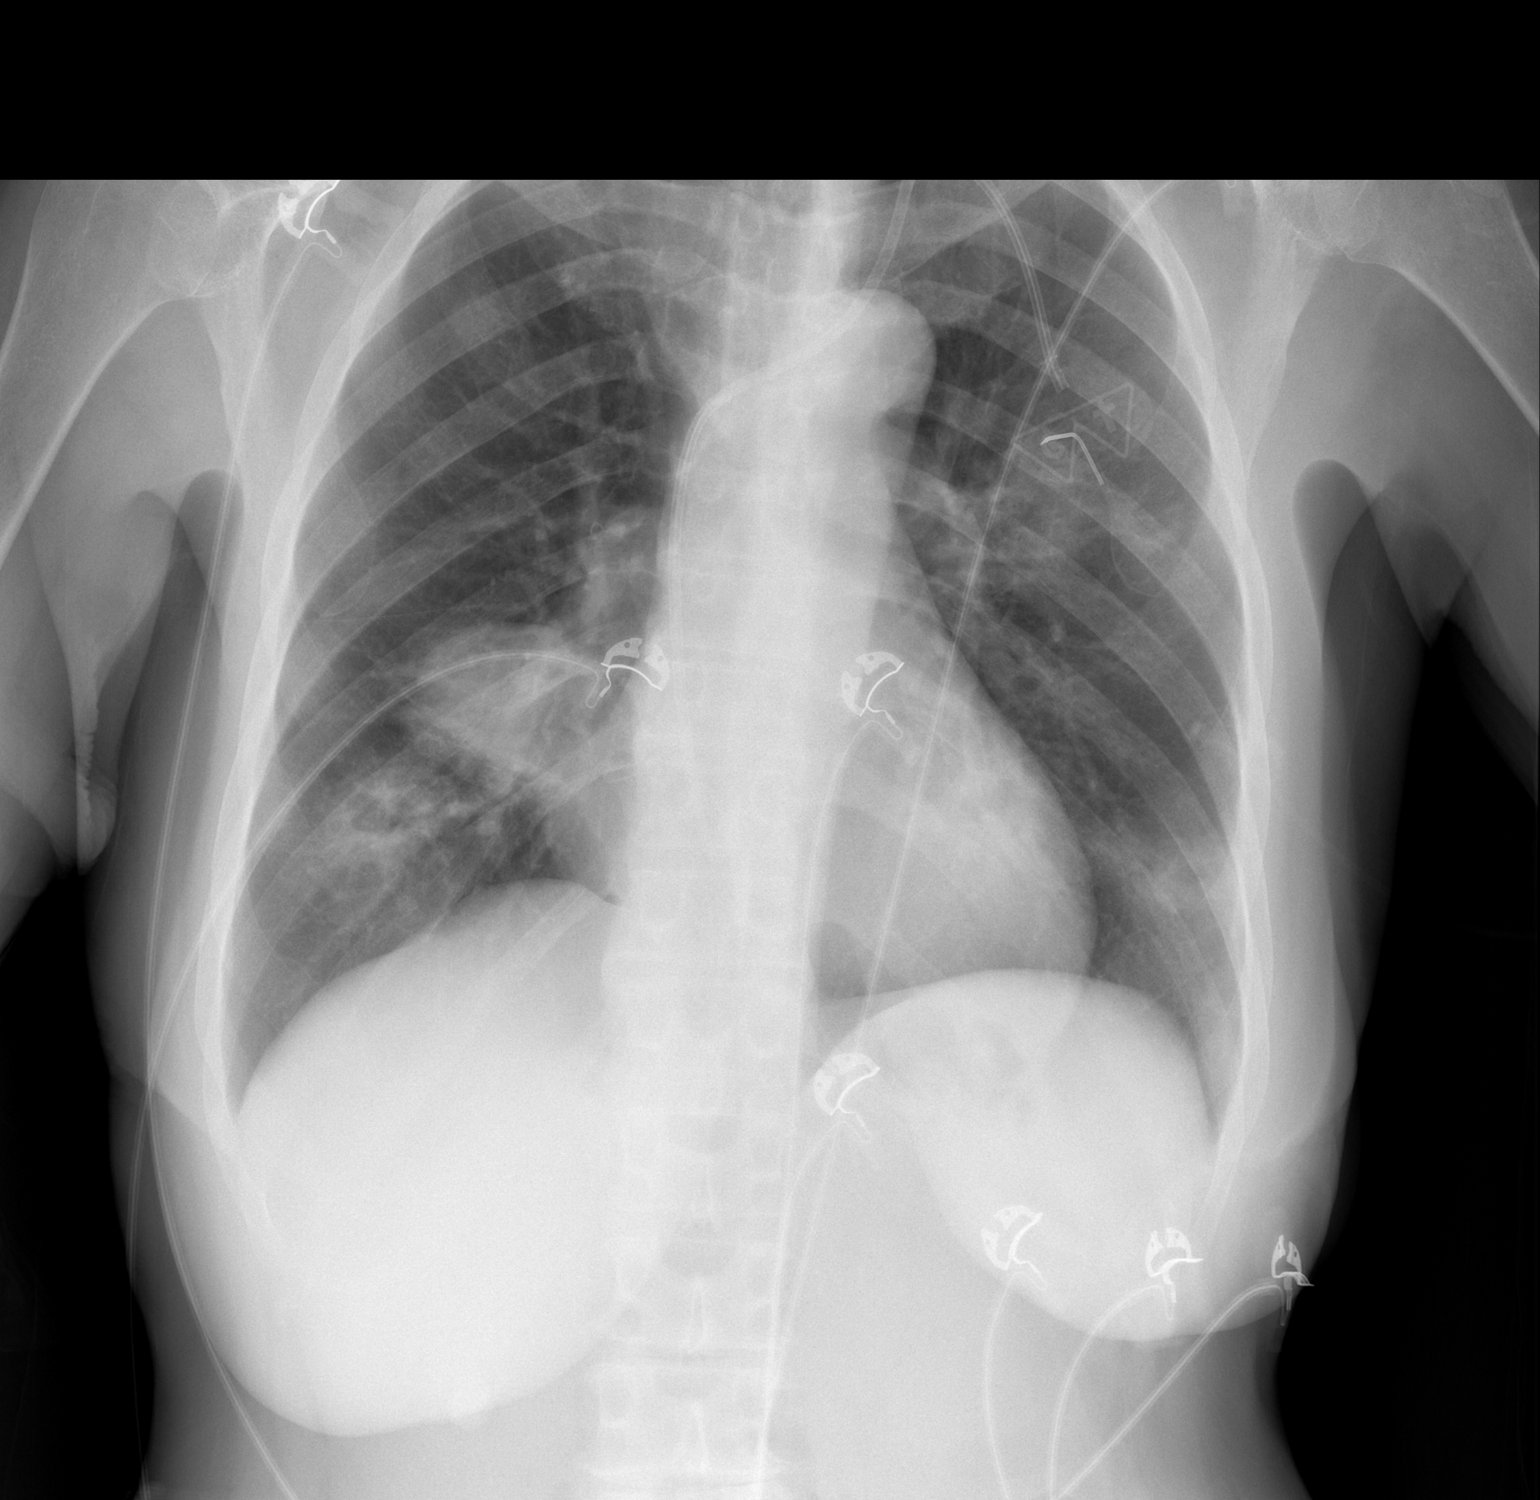

[w chest lat]
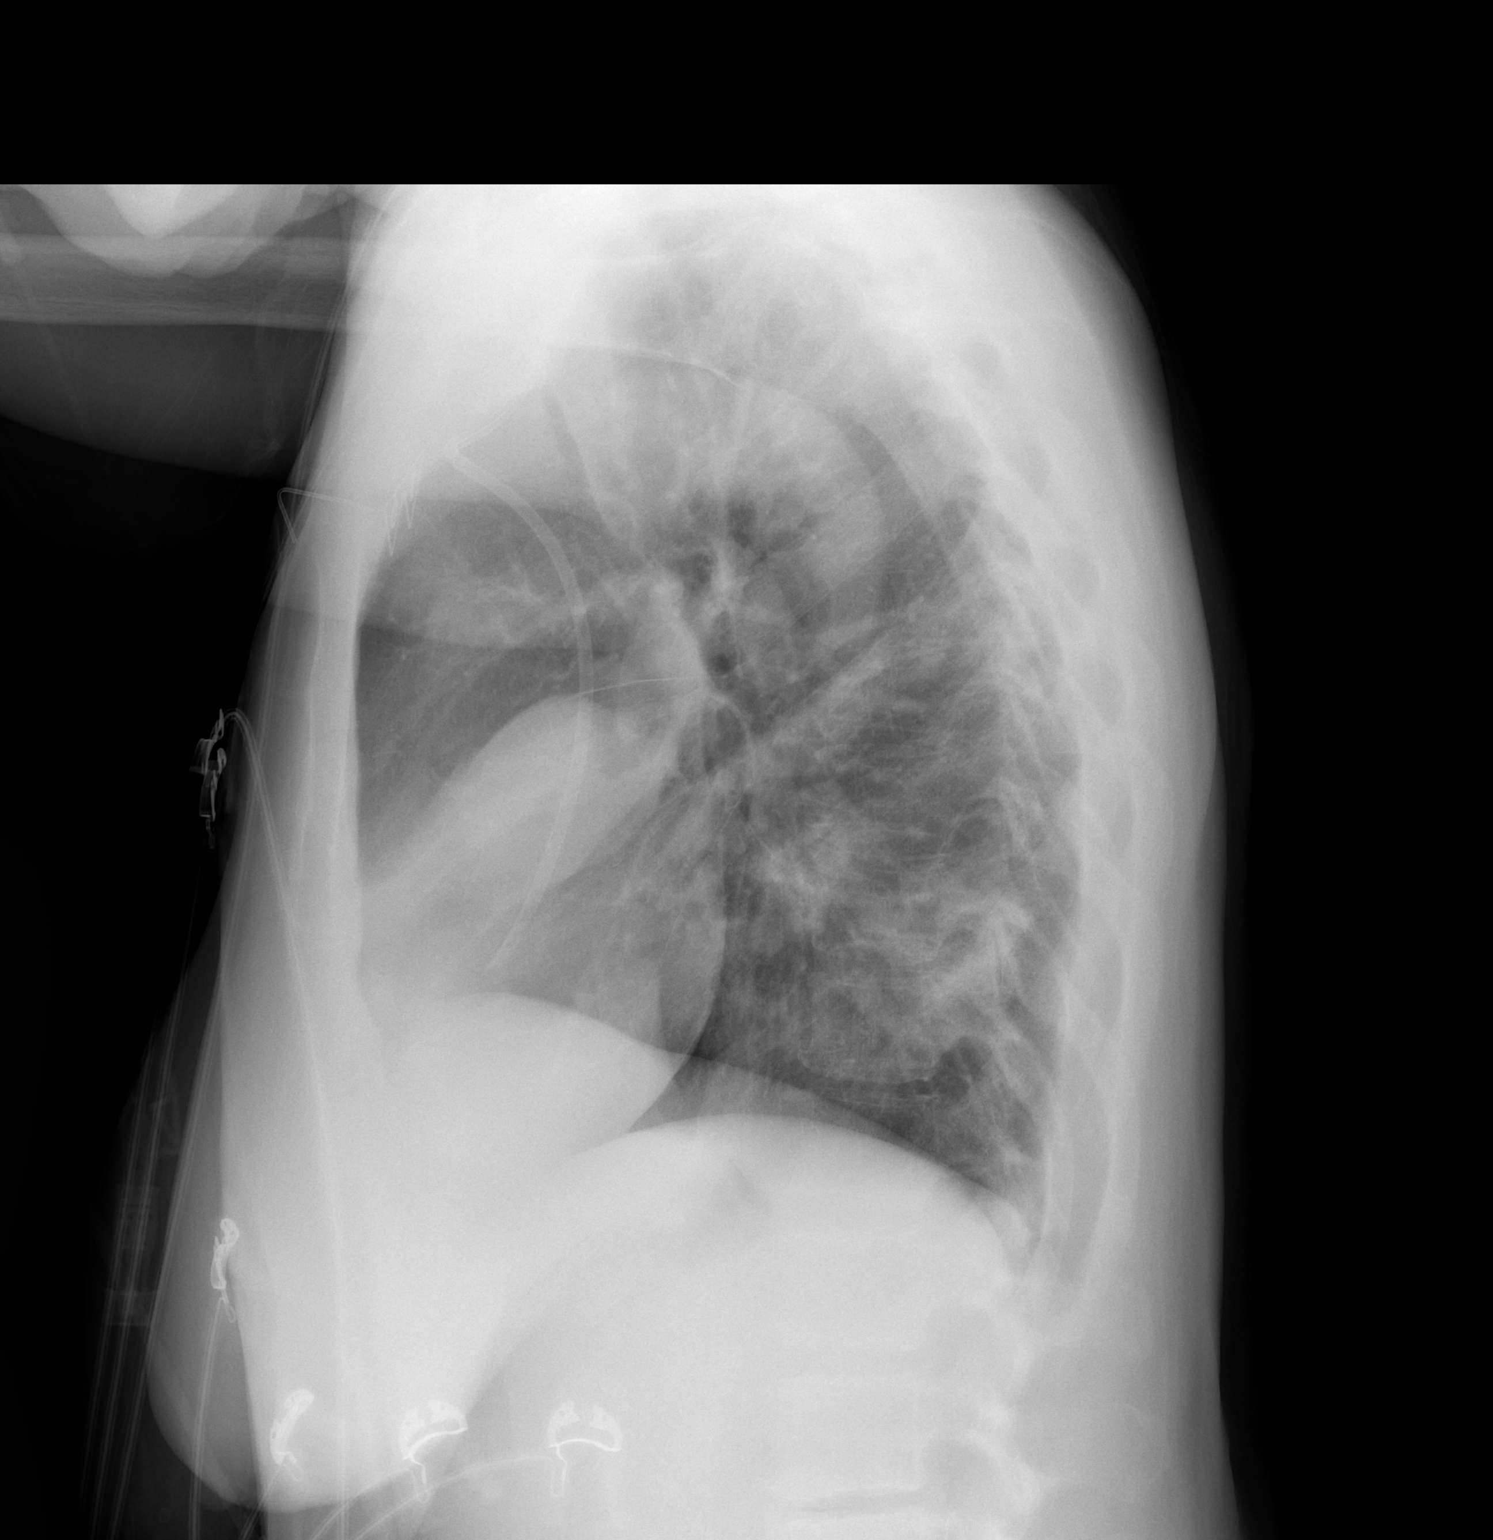

[2 of 2 positions shown; findings below may reference images not displayed]

FINDINGS: Left-sided central venous port tip overlies the mid right atrium.
Re- demonstrated multiple lung masses with dominant right infrahilar
mass measuring approximately 5.3 cm. No pleural effusion. Stable
heart size. No pneumothorax.
IMPRESSION: Re- demonstrated multiple pulmonary masses consistent with
metastatic disease, some of which are suspected to have cavitation.
Normal heart size. Negative for edema.
